# Patient Record
Sex: Female | Born: 1945 | Race: White | Hispanic: No | Smoking: Never smoker
Health system: Southern US, Community
[De-identification: ages and names within clinical notes are randomized; demographics above are authoritative.]

## PROBLEM LIST (undated history)

## (undated) DIAGNOSIS — I739 Peripheral vascular disease, unspecified: Secondary | ICD-10-CM

## (undated) DIAGNOSIS — I10 Essential (primary) hypertension: Secondary | ICD-10-CM

## (undated) DIAGNOSIS — I480 Paroxysmal atrial fibrillation: Secondary | ICD-10-CM

## (undated) DIAGNOSIS — I5032 Chronic diastolic (congestive) heart failure: Secondary | ICD-10-CM

## (undated) DIAGNOSIS — K5792 Diverticulitis of intestine, part unspecified, without perforation or abscess without bleeding: Secondary | ICD-10-CM

## (undated) DIAGNOSIS — C44611 Basal cell carcinoma of skin of unspecified upper limb, including shoulder: Secondary | ICD-10-CM

## (undated) DIAGNOSIS — N951 Menopausal and female climacteric states: Secondary | ICD-10-CM

## (undated) DIAGNOSIS — R931 Abnormal findings on diagnostic imaging of heart and coronary circulation: Secondary | ICD-10-CM

## (undated) DIAGNOSIS — M159 Polyosteoarthritis, unspecified: Secondary | ICD-10-CM

## (undated) DIAGNOSIS — I471 Supraventricular tachycardia, unspecified: Secondary | ICD-10-CM

## (undated) DIAGNOSIS — K219 Gastro-esophageal reflux disease without esophagitis: Secondary | ICD-10-CM

## (undated) DIAGNOSIS — I4891 Unspecified atrial fibrillation: Secondary | ICD-10-CM

## (undated) HISTORY — DX: Chronic diastolic (congestive) heart failure: I50.32

## (undated) HISTORY — DX: Supraventricular tachycardia, unspecified: I47.10

## (undated) HISTORY — DX: Gastro-esophageal reflux disease without esophagitis: K21.9

## (undated) HISTORY — DX: Abnormal findings on diagnostic imaging of heart and coronary circulation: R93.1

## (undated) HISTORY — PX: DILATION AND CURETTAGE OF UTERUS: SHX78

## (undated) HISTORY — DX: Basal cell carcinoma of skin of unspecified upper limb, including shoulder: C44.611

## (undated) HISTORY — DX: Diverticulitis of intestine, part unspecified, without perforation or abscess without bleeding: K57.92

## (undated) HISTORY — PX: KNEE SURGERY: SHX244

## (undated) HISTORY — PX: ABDOMINAL HYSTERECTOMY: SHX81

## (undated) HISTORY — DX: Polyosteoarthritis, unspecified: M15.9

## (undated) HISTORY — DX: Essential (primary) hypertension: I10

## (undated) HISTORY — DX: Paroxysmal atrial fibrillation: I48.0

## (undated) HISTORY — DX: Menopausal and female climacteric states: N95.1

---

## 2001-06-19 HISTORY — PX: BREAST BIOPSY: SHX20

## 2002-11-07 ENCOUNTER — Encounter: Payer: Self-pay | Admitting: Internal Medicine

## 2002-11-07 LAB — HM COLONOSCOPY

## 2005-03-19 ENCOUNTER — Encounter: Payer: Self-pay | Admitting: Internal Medicine

## 2005-03-19 LAB — HM DEXA SCAN

## 2005-04-04 ENCOUNTER — Encounter: Payer: Self-pay | Admitting: Internal Medicine

## 2005-04-07 ENCOUNTER — Ambulatory Visit: Payer: Self-pay | Admitting: Unknown Physician Specialty

## 2005-08-04 ENCOUNTER — Ambulatory Visit: Payer: Self-pay | Admitting: Internal Medicine

## 2006-01-02 ENCOUNTER — Ambulatory Visit: Payer: Self-pay | Admitting: Family Medicine

## 2006-04-11 ENCOUNTER — Ambulatory Visit: Payer: Self-pay | Admitting: Internal Medicine

## 2006-04-17 ENCOUNTER — Ambulatory Visit: Payer: Self-pay | Admitting: Internal Medicine

## 2007-01-14 ENCOUNTER — Telehealth: Payer: Self-pay | Admitting: Internal Medicine

## 2007-01-14 ENCOUNTER — Ambulatory Visit: Payer: Self-pay | Admitting: Internal Medicine

## 2007-01-14 DIAGNOSIS — F438 Other reactions to severe stress: Secondary | ICD-10-CM

## 2007-03-28 ENCOUNTER — Encounter: Payer: Self-pay | Admitting: Internal Medicine

## 2007-03-28 DIAGNOSIS — N959 Unspecified menopausal and perimenopausal disorder: Secondary | ICD-10-CM | POA: Insufficient documentation

## 2007-03-28 DIAGNOSIS — M810 Age-related osteoporosis without current pathological fracture: Secondary | ICD-10-CM | POA: Insufficient documentation

## 2007-03-28 DIAGNOSIS — I10 Essential (primary) hypertension: Secondary | ICD-10-CM

## 2007-03-28 DIAGNOSIS — K5730 Diverticulosis of large intestine without perforation or abscess without bleeding: Secondary | ICD-10-CM | POA: Insufficient documentation

## 2007-03-28 DIAGNOSIS — E785 Hyperlipidemia, unspecified: Secondary | ICD-10-CM | POA: Insufficient documentation

## 2007-04-17 ENCOUNTER — Ambulatory Visit: Payer: Self-pay | Admitting: Internal Medicine

## 2007-04-17 DIAGNOSIS — L570 Actinic keratosis: Secondary | ICD-10-CM

## 2007-04-18 LAB — CONVERTED CEMR LAB
CO2: 31 meq/L (ref 19–32)
Calcium: 9 mg/dL (ref 8.4–10.5)
Chloride: 104 meq/L (ref 96–112)
Direct LDL: 128.4 mg/dL
Eosinophils Relative: 3.8 % (ref 0.0–5.0)
Glucose, Bld: 99 mg/dL (ref 70–99)
HDL: 50.9 mg/dL (ref >39.0)
MCV: 90 fL (ref 78.0–100.0)
Platelets: 323 10*3/uL (ref 150–400)
Potassium: 4.4 meq/L (ref 3.5–5.1)
RBC: 4.39 M/uL (ref 3.87–5.11)
TSH: 2.16 microintl units/mL (ref 0.35–5.50)
Triglycerides: 79 mg/dL (ref 0–149)
WBC: 6.2 10*3/uL (ref 4.5–10.5)

## 2007-05-13 ENCOUNTER — Encounter: Payer: Self-pay | Admitting: Internal Medicine

## 2007-05-13 ENCOUNTER — Ambulatory Visit: Payer: Self-pay | Admitting: Internal Medicine

## 2007-05-20 ENCOUNTER — Encounter (INDEPENDENT_AMBULATORY_CARE_PROVIDER_SITE_OTHER): Payer: Self-pay | Admitting: *Deleted

## 2007-06-07 ENCOUNTER — Ambulatory Visit: Payer: Self-pay | Admitting: Internal Medicine

## 2007-07-11 DIAGNOSIS — R609 Edema, unspecified: Secondary | ICD-10-CM | POA: Insufficient documentation

## 2007-07-16 ENCOUNTER — Telehealth: Payer: Self-pay | Admitting: Family Medicine

## 2007-07-16 ENCOUNTER — Ambulatory Visit: Payer: Self-pay | Admitting: Internal Medicine

## 2007-07-16 ENCOUNTER — Telehealth (INDEPENDENT_AMBULATORY_CARE_PROVIDER_SITE_OTHER): Payer: Self-pay | Admitting: *Deleted

## 2007-07-17 ENCOUNTER — Encounter: Payer: Self-pay | Admitting: Internal Medicine

## 2007-07-18 ENCOUNTER — Telehealth (INDEPENDENT_AMBULATORY_CARE_PROVIDER_SITE_OTHER): Payer: Self-pay | Admitting: *Deleted

## 2007-07-22 ENCOUNTER — Ambulatory Visit: Payer: Self-pay | Admitting: Internal Medicine

## 2007-07-22 LAB — CONVERTED CEMR LAB: Rapid Strep: NEGATIVE

## 2007-10-11 ENCOUNTER — Ambulatory Visit: Payer: Self-pay | Admitting: Internal Medicine

## 2007-10-11 DIAGNOSIS — K219 Gastro-esophageal reflux disease without esophagitis: Secondary | ICD-10-CM | POA: Insufficient documentation

## 2008-04-17 ENCOUNTER — Ambulatory Visit: Payer: Self-pay | Admitting: Internal Medicine

## 2008-04-20 LAB — CONVERTED CEMR LAB
AST: 16 units/L (ref 0–37)
Albumin: 3.7 g/dL (ref 3.5–5.2)
Alkaline Phosphatase: 70 units/L (ref 39–117)
BUN: 18 mg/dL (ref 6–23)
Basophils Relative: 0.5 % (ref 0.0–3.0)
Calcium: 8.8 mg/dL (ref 8.4–10.5)
Creatinine, Ser: 0.8 mg/dL (ref 0.4–1.2)
Eosinophils Relative: 3.6 % (ref 0.0–5.0)
GFR calc Af Amer: 93 mL/min
GFR calc non Af Amer: 77 mL/min
Lymphocytes Relative: 37.1 % (ref 12.0–46.0)
Monocytes Relative: 10.7 % (ref 3.0–12.0)
Neutrophils Relative %: 48.1 % (ref 43.0–77.0)
RBC: 4.24 M/uL (ref 3.87–5.11)
Total Protein: 7.4 g/dL (ref 6.0–8.3)
WBC: 5.4 10*3/uL (ref 4.5–10.5)

## 2008-04-28 ENCOUNTER — Ambulatory Visit: Payer: Self-pay | Admitting: Internal Medicine

## 2008-04-28 ENCOUNTER — Encounter: Payer: Self-pay | Admitting: Internal Medicine

## 2008-05-18 ENCOUNTER — Ambulatory Visit: Payer: Self-pay | Admitting: Internal Medicine

## 2008-05-18 ENCOUNTER — Encounter: Payer: Self-pay | Admitting: Internal Medicine

## 2008-05-22 ENCOUNTER — Encounter (INDEPENDENT_AMBULATORY_CARE_PROVIDER_SITE_OTHER): Payer: Self-pay | Admitting: *Deleted

## 2008-07-09 ENCOUNTER — Ambulatory Visit: Payer: Self-pay | Admitting: Family Medicine

## 2008-11-25 ENCOUNTER — Ambulatory Visit: Payer: Self-pay | Admitting: Family Medicine

## 2008-11-25 DIAGNOSIS — IMO0002 Reserved for concepts with insufficient information to code with codable children: Secondary | ICD-10-CM

## 2008-12-04 ENCOUNTER — Encounter: Payer: Self-pay | Admitting: Family Medicine

## 2008-12-10 ENCOUNTER — Encounter: Payer: Self-pay | Admitting: Family Medicine

## 2008-12-17 ENCOUNTER — Encounter: Payer: Self-pay | Admitting: Family Medicine

## 2009-01-11 ENCOUNTER — Encounter: Payer: Self-pay | Admitting: Internal Medicine

## 2009-04-22 ENCOUNTER — Encounter: Payer: Self-pay | Admitting: Internal Medicine

## 2009-04-23 ENCOUNTER — Ambulatory Visit: Payer: Self-pay | Admitting: Internal Medicine

## 2009-04-23 DIAGNOSIS — R002 Palpitations: Secondary | ICD-10-CM

## 2009-04-23 DIAGNOSIS — F341 Dysthymic disorder: Secondary | ICD-10-CM | POA: Insufficient documentation

## 2009-04-26 LAB — CONVERTED CEMR LAB
ALT: 12 units/L (ref 0–35)
AST: 16 units/L (ref 0–37)
Albumin: 3.8 g/dL (ref 3.5–5.2)
Alkaline Phosphatase: 74 units/L (ref 39–117)
BUN: 11 mg/dL (ref 6–23)
Basophils Absolute: 0.1 10*3/uL (ref 0.0–0.1)
Basophils Relative: 1 % (ref 0.0–3.0)
Bilirubin, Direct: 0 mg/dL (ref 0.0–0.3)
CO2: 30 meq/L (ref 19–32)
Calcium: 8.9 mg/dL (ref 8.4–10.5)
Chloride: 106 meq/L (ref 96–112)
Cholesterol: 194 mg/dL (ref 0–200)
Creatinine, Ser: 0.8 mg/dL (ref 0.4–1.2)
Creatinine,U: 202.8 mg/dL
Eosinophils Absolute: 0.1 10*3/uL (ref 0.0–0.7)
Eosinophils Relative: 2.4 % (ref 0.0–5.0)
GFR calc non Af Amer: 76.43 mL/min (ref >60)
Glucose, Bld: 93 mg/dL (ref 70–99)
HCT: 38.9 % (ref 36.0–46.0)
HDL: 43.5 mg/dL (ref >39.00)
Hemoglobin: 13.3 g/dL (ref 12.0–15.0)
LDL Cholesterol: 120 mg/dL — ABNORMAL HIGH (ref 0–99)
Lymphocytes Relative: 34.8 % (ref 12.0–46.0)
Lymphs Abs: 1.9 10*3/uL (ref 0.7–4.0)
MCHC: 34.2 g/dL (ref 30.0–36.0)
MCV: 90.7 fL (ref 78.0–100.0)
Microalb Creat Ratio: 10.8 mg/g (ref 0.0–30.0)
Microalb, Ur: 2.2 mg/dL — ABNORMAL HIGH (ref 0.0–1.9)
Monocytes Absolute: 0.5 10*3/uL (ref 0.1–1.0)
Monocytes Relative: 9.2 % (ref 3.0–12.0)
Neutro Abs: 3 10*3/uL (ref 1.4–7.7)
Neutrophils Relative %: 52.6 % (ref 43.0–77.0)
Phosphorus: 3.3 mg/dL (ref 2.3–4.6)
Platelets: 240 10*3/uL (ref 150.0–400.0)
Potassium: 4.1 meq/L (ref 3.5–5.1)
RBC: 4.29 M/uL (ref 3.87–5.11)
RDW: 12.7 % (ref 11.5–14.6)
Sodium: 143 meq/L (ref 135–145)
TSH: 2.66 microintl units/mL (ref 0.35–5.50)
Total Bilirubin: 0.7 mg/dL (ref 0.3–1.2)
Total CHOL/HDL Ratio: 4
Total Protein: 7.8 g/dL (ref 6.0–8.3)
Triglycerides: 151 mg/dL — ABNORMAL HIGH (ref 0.0–149.0)
VLDL: 30.2 mg/dL (ref 0.0–40.0)
WBC: 5.6 10*3/uL (ref 4.5–10.5)

## 2009-05-07 ENCOUNTER — Ambulatory Visit: Payer: Self-pay | Admitting: Licensed Clinical Social Worker

## 2009-05-20 ENCOUNTER — Encounter: Payer: Self-pay | Admitting: Internal Medicine

## 2009-05-20 ENCOUNTER — Ambulatory Visit: Payer: Self-pay | Admitting: Internal Medicine

## 2009-05-21 ENCOUNTER — Ambulatory Visit: Payer: Self-pay | Admitting: Licensed Clinical Social Worker

## 2009-05-27 ENCOUNTER — Encounter: Payer: Self-pay | Admitting: Internal Medicine

## 2009-05-28 ENCOUNTER — Ambulatory Visit: Payer: Self-pay | Admitting: Licensed Clinical Social Worker

## 2009-06-25 ENCOUNTER — Ambulatory Visit: Payer: Self-pay | Admitting: Licensed Clinical Social Worker

## 2009-07-16 ENCOUNTER — Ambulatory Visit: Payer: Self-pay | Admitting: Licensed Clinical Social Worker

## 2009-07-19 ENCOUNTER — Telehealth: Payer: Self-pay | Admitting: Internal Medicine

## 2009-08-27 ENCOUNTER — Ambulatory Visit: Payer: Self-pay | Admitting: Licensed Clinical Social Worker

## 2009-09-17 ENCOUNTER — Ambulatory Visit: Payer: Self-pay | Admitting: Licensed Clinical Social Worker

## 2010-02-25 ENCOUNTER — Ambulatory Visit: Payer: Self-pay | Admitting: Internal Medicine

## 2010-02-28 LAB — CONVERTED CEMR LAB
ALT: 12 units/L (ref 0–35)
Albumin: 4 g/dL (ref 3.5–5.2)
Alkaline Phosphatase: 73 units/L (ref 39–117)
Basophils Relative: 0.7 % (ref 0.0–3.0)
Bilirubin, Direct: 0.1 mg/dL (ref 0.0–0.3)
CO2: 31 meq/L (ref 19–32)
Calcium: 9.4 mg/dL (ref 8.4–10.5)
Chloride: 104 meq/L (ref 96–112)
Creatinine,U: 144.3 mg/dL
Eosinophils Absolute: 0.1 10*3/uL (ref 0.0–0.7)
Eosinophils Relative: 1.9 % (ref 0.0–5.0)
Hemoglobin: 13 g/dL (ref 12.0–15.0)
MCHC: 34 g/dL (ref 30.0–36.0)
MCV: 89.9 fL (ref 78.0–100.0)
Microalb Creat Ratio: 1.7 mg/g (ref 0.0–30.0)
Monocytes Absolute: 0.6 10*3/uL (ref 0.1–1.0)
Neutro Abs: 3.2 10*3/uL (ref 1.4–7.7)
Neutrophils Relative %: 48.3 % (ref 43.0–77.0)
Potassium: 4.4 meq/L (ref 3.5–5.1)
RBC: 4.24 M/uL (ref 3.87–5.11)
Sodium: 142 meq/L (ref 135–145)
TSH: 2.42 microintl units/mL (ref 0.35–5.50)
Total Protein: 7.2 g/dL (ref 6.0–8.3)
WBC: 6.5 10*3/uL (ref 4.5–10.5)

## 2010-04-28 ENCOUNTER — Telehealth: Payer: Self-pay | Admitting: Internal Medicine

## 2010-05-27 ENCOUNTER — Ambulatory Visit: Payer: Self-pay | Admitting: Internal Medicine

## 2010-05-27 ENCOUNTER — Encounter: Payer: Self-pay | Admitting: Internal Medicine

## 2010-06-03 LAB — HM MAMMOGRAPHY: HM Mammogram: NORMAL

## 2010-06-06 ENCOUNTER — Encounter: Payer: Self-pay | Admitting: Internal Medicine

## 2010-07-19 NOTE — Progress Notes (Signed)
Summary: order for mammogram   Phone Note Call from Patient Call back at Home Phone [phone]   Caller: Patient Call For: Cindee Salt MD Summary of Call: Patient is asking if she could get an order for her yearly mammogram [facility]. She will make her own appt.  Initial call taken by: Melody Comas,  April 28, 2010 10:04 AM  Follow-up for Phone Call        Please fax over order Follow-up by: Cindee Salt MD,  April 28, 2010 2:59 PM

## 2010-07-19 NOTE — Assessment & Plan Note (Signed)
Summary: F/U/CLE   Vital Signs:  Patient profile:   65 year old female Weight:      220 pounds BMI:     33.57 Temp:     98.1 degrees F oral Pulse rate:   68 / minute Pulse rhythm:   regular BP sitting:   160 / 90  (left arm) Cuff size:   regular  Vitals Entered By: Mervin Hack CMA Duncan Dull) (February 25, 2010 10:26 AM) CC: follow-up visit   History of Present Illness: Doing fairly well Does note some pain in feet and legs when she walks Did see Dr Orland Jarred for injections but has persistent pain Has thigh and calf pain when she first gets up and it eases up after 15 minutes or so Does wear orthotics No regular exercise  Did go to [employer] in the spring Doing much better  Doesn't check blood pressure No headaches No chest pain No SOB No edema  Did see Van Clines about her right knee does have some arthritis there  stomach fine on the meds---only uses it as needed     Allergies: 1)  ! * Pencillin 2)  Boniva (Ibandronate Sodium)  Past History:  Past medical, surgical, family and social histories (including risk factors) reviewed for relevance to current acute and chronic problems.  Past Medical History: Reviewed history from 10/11/2007 and no changes required. Diverticulosis, colon Hyperlipidemia Hypertension Osteoporosis Menopausal symptoms GERD  Past Surgical History: Reviewed history from 04/17/2008 and no changes required. D & C- rupture- peritonitis & hysterectomy 12/79 Vaginal deliveries x 2 Left breast calcifications removed DEXA- osteoporosis - spine 10/06  Family History: Reviewed history from 03/28/2007 and no changes required. Dad had tinnitus but didn't lose his hearing Father: Died at age 56- CAD Mother: Died at age 51- Died after hip fracture- osteoporosis Siblings: One brother, one sister alive with breast cancer CAD in  Dad & Dad's side No HTN or DM Breast cancer in  sister Mat GF died of colon cancer;  Dad  and Pat GM had polyps  Social History: Reviewed history from 04/17/2007 and no changes required. 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  Stress with daughter still going through divorce  Physical Exam  General:  alert and normal appearance.   Neck:  supple, no masses, no thyromegaly, no carotid bruits, and no cervical lymphadenopathy.   Lungs:  normal respiratory effort, no intercostal retractions, no accessory muscle use, and normal breath sounds.   Heart:  normal rate, regular rhythm, no murmur, and no gallop.   Abdomen:  soft and non-tender.   Msk:  no joint tenderness and no joint swelling.   Pulses:  1+ Neurologic:  antalgic gait when just getting up No weakness Psych:  normally interactive, good eye contact, not anxious appearing, and not depressed appearing.     Impression & Recommendations:  Problem # 1:  HYPERTENSION (ICD-401.9) Assessment Unchanged  borderline will hold off on treatment but discussed fitness efforts will check urine microal and treat if positive  BP today: 160/90 Prior BP: 160/88 (04/23/2009)  Labs Reviewed: K+: 4.1 (04/23/2009) Creat: : 0.8 (04/23/2009)   Chol: 194 (04/23/2009)   HDL: 43.50 (04/23/2009)   LDL: 120 (04/23/2009)  TG: 151.0 (04/23/2009)  Orders: TLB-Renal Function Panel (80069-RENAL) TLB-CBC Platelet - w/Differential (85025-CBCD) TLB-Hepatic/Liver Function Pnl (80076-HEPATIC) TLB-TSH (Thyroid Stimulating Hormone) (84443-TSH) Venipuncture (33295)  Problem # 2:  DYSTHYMIA (ICD-300.4) Assessment: Improved doing better after counselling still some stress and mild sleep problems she will go back for more counselling if worsens  Problem # 3:  HYPERLIPIDEMIA (ICD-272.4) Assessment:  Unchanged will just continue the fish oil  Labs Reviewed: SGOT: 16 (04/23/2009)   SGPT: 12 (04/23/2009)   HDL:43.50 (04/23/2009), 50.9 (04/17/2007)  LDL:120 (04/23/2009), DEL (04/17/2007)  Chol:194 (04/23/2009), 205 (04/17/2007)  Trig:151.0 (04/23/2009), 79 (04/17/2007)  Problem # 4:  GERD (ICD-530.81) Assessment: Comment Only fine with just occ meds  Her updated medication list for this problem includes:    Omeprazole 20 Mg Cpdr (Omeprazole) .Marland Kitchen... Take 1 tablet by mouth once a day as needed  Complete Medication List: 1)  Fish Oil Oil (Fish oil) .... Daily 2)  Omeprazole 20 Mg Cpdr (Omeprazole) .... Take 1 tablet by mouth once a day as needed 3)  Vitamin D 1000 Unit Tabs (Cholecalciferol) .Marland Kitchen.. 1 tab daily  Patient Instructions: 1)  Please schedule a follow-up appointment in 1 year.   Current Allergies (reviewed today): ! * PENCILLIN BONIVA (IBANDRONATE SODIUM)  Appended Document: Orders Update    Clinical Lists Changes  Orders: Added new Test order of TLB-Microalbumin/Creat Ratio, Urine (82043-MALB) - Signed      Appended Document: F/U/CLE    Clinical Lists Changes  Orders: Added new Service order of Flu Vaccine 45yrs + [phone]) - Signed Added new Service order of Admin 1st Vaccine (66063) - Signed Added new Service order of Admin 1st Vaccine [facility]) [phone]) - Signed Observations: Added new observation of FLU VAX#1VIS: 01/11/10 version given February 25, 2010. (02/25/2010 11:33) Added new observation of FLU VAXLOT: [id number] (02/25/2010 11:33) Added new observation of FLU VAX EXP: 12/17/2010 (02/25/2010 11:33) Added new observation of FLU VAXBY: DeShannon Smith CMA (AAMA) (02/25/2010 11:33) Added new observation of FLU VAXRTE: IM (02/25/2010 11:33) Added new observation of FLU VAX DSE: 0.5 ml (02/25/2010 11:33) Added new observation of FLU VAXMFR: GlaxoSmithKline (02/25/2010 11:33) Added new observation of FLU VAX SITE: right deltoid (02/25/2010 11:33) Added  new observation of FLU VAX: Fluvax 3+ (02/25/2010 11:33)       Influenza Vaccine    Vaccine Type: Fluvax 3+    Site: right deltoid    Mfr: GlaxoSmithKline    Dose: 0.5 ml    Route: IM    Given by: Mervin Hack CMA (AAMA)    Exp. Date: 12/17/2010    Lot #: [id number]    VIS given: 01/11/10 version given February 25, 2010.  Flu Vaccine Consent Questions    Do you have a history of severe allergic reactions to this vaccine? no    Any prior history of allergic reactions to egg and/or gelatin? no    Do you have a sensitivity to the preservative Thimersol? no    Do you have a past history of Guillan-Barre Syndrome? no    Do you currently have an acute febrile illness? no    Have you ever had a severe reaction to latex? no    Vaccine information given and explained to patient? yes    Are you currently pregnant? no

## 2010-07-19 NOTE — Progress Notes (Signed)
Summary: ? Flu, virus  Phone Note Call from Patient Call back at Home Phone [phone]   Caller: Patient Call For: Cindee Salt MD Summary of Call: Patient called and stated that she has been sick since Saturday.  Headache, body aches, chills, congestion, no sore throat, and coughing.  She kept her grandson this weekend and he had been to the doctor and was treated for the flu.  She has been taking Theraflu, Tylenol, cough drops, and sinus pills.  Advised that this sounds like it could be the flu or viral and if so there is not much that can be done.  Get plenty of rest, clear liquids, Braty diet.  If symptoms worsen call us back.  Will sent note to the doctor.   Initial call taken by: Linde Gillis CMA Duncan Dull),  July 19, 2009 12:37 PM  Follow-up for Phone Call        agreed --too late for antivirals even if it is the flu See if sig fever or SOB Follow-up by: Cindee Salt MD,  July 19, 2009 12:57 PM  Additional Follow-up for Phone Call Additional follow up Details #1::        Spoke with patient and advised results.  Additional Follow-up by: Mervin Hack CMA Duncan Dull),  July 19, 2009 3:52 PM

## 2010-07-21 NOTE — Letter (Signed)
Summary: Results Follow up Letter  [place] [facility]  [address] [place], [place] [address]   Phone: [phone]  Fax: [phone]    06/06/2010 MRN: [id number]  Veronica Stanley [address] [place], [place]  [address]  Dear [name],  The following are the results of your recent test(s):  Test         Result    Pap Smear:        Normal _____  Not Normal _____ Comments: ______________________________________________________ Cholesterol: LDL(Bad cholesterol):         Your goal is less than:         HDL (Good cholesterol):       Your goal is more than: Comments:  ______________________________________________________ Mammogram:        Normal __X___  Not Normal _____ Comments:mammo looks fine, repeat recommended in 1-2 years  ___________________________________________________________________ Hemoccult:        Normal _____  Not normal _______ Comments:    _____________________________________________________________________ Other Tests:    We routinely do not discuss normal results over the telephone.  If you desire a copy of the results, or you have any questions about this information we can discuss them at your next office visit.   Sincerely,      Tillman Abide, MD

## 2010-10-05 ENCOUNTER — Encounter: Payer: Self-pay | Admitting: Internal Medicine

## 2010-10-10 ENCOUNTER — Ambulatory Visit (INDEPENDENT_AMBULATORY_CARE_PROVIDER_SITE_OTHER): Payer: BC Managed Care – PPO | Admitting: Internal Medicine

## 2010-10-10 ENCOUNTER — Encounter: Payer: Self-pay | Admitting: Internal Medicine

## 2010-10-10 VITALS — BP 148/90 | HR 68 | Temp 98.0°F | Ht 68.0 in | Wt 226.0 lb

## 2010-10-10 DIAGNOSIS — S76119A Strain of unspecified quadriceps muscle, fascia and tendon, initial encounter: Secondary | ICD-10-CM

## 2010-10-10 DIAGNOSIS — S838X9A Sprain of other specified parts of unspecified knee, initial encounter: Secondary | ICD-10-CM

## 2010-10-10 DIAGNOSIS — F430 Acute stress reaction: Secondary | ICD-10-CM

## 2010-10-10 MED ORDER — LORAZEPAM 0.5 MG PO TABS: 0.2500 mg | ORAL_TABLET | Freq: Two times a day (BID) | ORAL | Status: DC | PRN

## 2010-10-10 NOTE — Patient Instructions (Signed)
Please wear compression shorts or use a compression sleeve on left upper leg. Use ice after exerting yourself if your leg hurts

## 2010-10-10 NOTE — Progress Notes (Signed)
  Subjective:    Patient ID: Veronica Stanley, female    DOB: 22-Jun-1945, 65 y.o.   MRN: [id number]  Leg Pain    Had pain on left groin that has persisted--goes back almost 2 years (from after strain after lifting stones) Does better with special shoes but can't wear them to work Got PT 2 years ago Got new mattress to see if that would help (last year) Still has sleep problems---so she changes to harder mattress elsewhere in house  Variable Better today but worse last week Worried-- "would if I have a blockage"  No hernia or bulging  Pain if she overdoes it--like lots of lawn work It is tender at times No radiation down leg or into back No discrete weakness but may "give way" at times Leg seems to internally rotate at times  Lots of stress Health issues with grandson and daughter going through divorce Has tried melatonin and it helps sleep Wonders about something for nerves Plans to retire for good soon  Current outpatient prescriptions:Cholecalciferol (VITAMIN D) 1000 UNITS capsule, Take 1,000 Units by mouth daily.  , Disp: , Rfl: ;  fish oil-omega-3 fatty acids 1000 MG capsule, Take 2 g by mouth daily.  , Disp: , Rfl: ;  omeprazole (PRILOSEC) 20 MG capsule, Take 20 mg by mouth daily as needed.  , Disp: , Rfl:   Past Medical History  Diagnosis Date  . Diverticulitis     colon  . Hyperlipidemia   . Hypertension   . Osteoporosis   . GERD (gastroesophageal reflux disease)   . Menopausal symptoms     Past Surgical History  Procedure Date  . Dilation and curettage of uterus     rupture- peritonitis & hysterectomy 12/79  . Vaginal delivery     x2  . Breast biopsy     Left breast calcifications removed    Family History  Problem Relation Age of Onset  . Osteoporosis Mother   . Coronary artery disease Father   . Breast cancer Sister   . Cancer Maternal Grandfather     colon cancer  . Diabetes Neg Hx   . Hypertension Neg Hx     History   Social History  . Marital  Status: Divorced    Spouse Name: N/A    Number of Children: 2  . Years of Education: N/A   Occupational History  . 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 part-time Armed forces operational officer court system    Social History Main Topics  . Smoking status: Never Smoker   . Smokeless tobacco: Never Used  . Alcohol Use: Yes     occasional  . Drug Use: Not on file  . Sexually Active: Not on file   Other Topics Concern  . Not on file   Social History Narrative  . No narrative on file   Review of Systems No change in bowels  Voids without probelms   Objective:   Physical Exam  Cardiovascular: Intact distal pulses.   Abdominal:       No femoral or inguinal hernias  Musculoskeletal:       Tenderness along left upper quad muscle  Psychiatric: She has a normal mood and affect. Her behavior is normal. Judgment and thought content normal.          Assessment & Plan:

## 2011-03-03 ENCOUNTER — Encounter: Payer: Self-pay | Admitting: Internal Medicine

## 2011-03-03 ENCOUNTER — Ambulatory Visit (INDEPENDENT_AMBULATORY_CARE_PROVIDER_SITE_OTHER): Payer: Medicare Other | Admitting: Internal Medicine

## 2011-03-03 VITALS — BP 140/98 | HR 66 | Temp 98.9°F | Ht 68.0 in | Wt 226.0 lb

## 2011-03-03 DIAGNOSIS — E785 Hyperlipidemia, unspecified: Secondary | ICD-10-CM

## 2011-03-03 DIAGNOSIS — Z0000 Encounter for general adult medical examination without abnormal findings: Secondary | ICD-10-CM | POA: Insufficient documentation

## 2011-03-03 DIAGNOSIS — F438 Other reactions to severe stress: Secondary | ICD-10-CM

## 2011-03-03 DIAGNOSIS — M159 Polyosteoarthritis, unspecified: Secondary | ICD-10-CM

## 2011-03-03 DIAGNOSIS — I10 Essential (primary) hypertension: Secondary | ICD-10-CM

## 2011-03-03 DIAGNOSIS — K219 Gastro-esophageal reflux disease without esophagitis: Secondary | ICD-10-CM

## 2011-03-03 DIAGNOSIS — M810 Age-related osteoporosis without current pathological fracture: Secondary | ICD-10-CM

## 2011-03-03 LAB — LIPID PANEL
Total CHOL/HDL Ratio: 4
VLDL: 17.2 mg/dL (ref 0.0–40.0)

## 2011-03-03 LAB — LDL CHOLESTEROL, DIRECT: Direct LDL: 134.7 mg/dL

## 2011-03-03 LAB — CBC WITH DIFFERENTIAL/PLATELET
Basophils Relative: 0.6 % (ref 0.0–3.0)
Eosinophils Relative: 2.4 % (ref 0.0–5.0)
HCT: 40.7 % (ref 36.0–46.0)
Lymphs Abs: 2.2 10*3/uL (ref 0.7–4.0)
MCHC: 33 g/dL (ref 30.0–36.0)
MCV: 90.9 fl (ref 78.0–100.0)
Monocytes Absolute: 0.5 10*3/uL (ref 0.1–1.0)
Neutro Abs: 3.5 10*3/uL (ref 1.4–7.7)
RBC: 4.48 Mil/uL (ref 3.87–5.11)
WBC: 6.5 10*3/uL (ref 4.5–10.5)

## 2011-03-03 LAB — HEPATIC FUNCTION PANEL
ALT: 10 U/L (ref 0–35)
AST: 17 U/L (ref 0–37)
Albumin: 4.1 g/dL (ref 3.5–5.2)
Total Bilirubin: 0.5 mg/dL (ref 0.3–1.2)
Total Protein: 7.8 g/dL (ref 6.0–8.3)

## 2011-03-03 LAB — BASIC METABOLIC PANEL
CO2: 28 mEq/L (ref 19–32)
Chloride: 105 mEq/L (ref 96–112)
Potassium: 4.8 mEq/L (ref 3.5–5.1)

## 2011-03-03 NOTE — Assessment & Plan Note (Signed)
Improved lately No Rx indicated Has lorazepam for prn use

## 2011-03-03 NOTE — Assessment & Plan Note (Signed)
I have personally reviewed the Medicare Annual Wellness questionnaire and have noted 1. The patient's medical and social history 2. Their use of alcohol, tobacco or illicit drugs 3. Their current medications and supplements 4. The patient's functional ability including ADL's, fall risks, home safety risks and hearing or visual             impairment. 5. Diet and physical activities 6. Evidence for depression or mood disorders  The patients weight, height, BMI and visual acuity have been recorded in the chart I have made referrals, counseling and provided education to the patient based review of the above and I have provided the pt with a written personalized care plan for preventive services.  I have provided you with a copy of your personalized plan for preventive services. Please take the time to review along with your updated medication list.  Due for zostavax--Rx given counselling done

## 2011-03-03 NOTE — Patient Instructions (Signed)
Please get the shingles vaccine Set up your bone density test Please start walking daily Start Weight Watchers again to control your eating

## 2011-03-03 NOTE — Progress Notes (Signed)
Subjective:    Patient ID: Veronica Stanley, female    DOB: [birth year], 65 y.o.   MRN: [id number]  HPI Here for wellness exam UTD on immunizations---[place]  UTD on cancer screening She may want to stick with yearly mammograms due to sister's history of breast cancer  Hasn't fallen. Does fear falls Know osteoporosis in spine Will recheck now  Feels lonesome but not really depressed Got overly sedated even with 1/4 of the lorazepam Sleeps okay with melatonin prn Still with family stress but not quite as severe Active in church and has group of lady friends she is active with  Still working part time at courthouse at [employer] Is retired from [employer]  Has acid reflux at times Uses the nighttime famotidine more than omeprazole No swallowing problems  Doesn't check BP No headaches No chest pain Stable DOE with stairs---conditioning is poor Doesn't really exercise No dietary regimen  Still on fish oil for cholesterol  Ongoing leg and knee pain Gets stiff after sitting  Current Outpatient Prescriptions on File Prior to Visit  Medication Sig Dispense Refill  . Cholecalciferol (VITAMIN D) 1000 UNITS capsule Take 1,000 Units by mouth daily.        . fish oil-omega-3 fatty acids 1000 MG capsule Take 2 g by mouth daily.        Marland Kitchen LORazepam (ATIVAN) 0.5 MG tablet Take 0.5-1 tablets (0.25-0.5 mg total) by mouth 2 (two) times daily as needed for anxiety.  30 tablet  0  . omeprazole (PRILOSEC) 20 MG capsule Take 20 mg by mouth daily as needed.          Allergies  Allergen Reactions  . Ibandronate Sodium     REACTION: aches  . Penicillins     Past Medical History  Diagnosis Date  . Diverticulitis     colon  . Hyperlipidemia   . Hypertension   . Osteoporosis   . GERD (gastroesophageal reflux disease)   . Menopausal symptoms   . Osteoarthrosis involving, or with mention of more than one site, but not specified as generalized, multiple sites      Past Surgical History  Procedure Date  . Dilation and curettage of uterus     rupture- peritonitis & hysterectomy 12/79  . Vaginal delivery     x2  . Breast biopsy     Left breast calcifications removed    Family History  Problem Relation Age of Onset  . Osteoporosis Mother   . Coronary artery disease Father   . Breast cancer Sister   . Cancer Maternal Grandfather     colon cancer  . Diabetes Neg Hx   . Hypertension Neg Hx     History   Social History  . Marital Status: Divorced    Spouse Name: N/A    Number of Children: 2  . Years of Education: N/A   Occupational History  . 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 part-time Armed forces operational officer court system    Social History Main Topics  . Smoking status: Never Smoker   . Smokeless tobacco: Never Used  . Alcohol Use: Yes     occasional  . Drug Use: Not on file  . Sexually Active: Not on file   Other Topics Concern  . Not on file   Social History Narrative   No living willWould want daughter, [name], to make health care decisions for herWould accept resuscitation attemptsNot sure about tube feedings   Review  of Systems Sleeps okay but occ uses the melatonin Aleve or tylenol do help the leg aching Appetite is fine---no real dietary regimen    Objective:   Physical Exam  Constitutional: She is oriented to person, place, and time. She appears well-developed and well-nourished. No distress.  Neck: Normal range of motion. Neck supple. No thyromegaly present.  Cardiovascular: Normal rate, regular rhythm, normal heart sounds and intact distal pulses.  Exam reveals no gallop.   No murmur heard. Pulmonary/Chest: Effort normal and breath sounds normal. No respiratory distress. She has no wheezes. She has no rales.  Abdominal: Soft. There is no tenderness.  Musculoskeletal: She exhibits no edema.       Mild swelling of right knee  Lymphadenopathy:    She has no cervical adenopathy.  Neurological: She is  alert and oriented to person, place, and time.       Phillips Grout" [phone] D-[id number] Recall 2/3  Psychiatric: She has a normal mood and affect. Her behavior is normal. Judgment and thought content normal.          Assessment & Plan:

## 2011-03-03 NOTE — Assessment & Plan Note (Signed)
BP Readings from Last 3 Encounters:  03/03/11 140/98  10/10/10 148/90  02/25/10 160/90   Discussed that she is still elevated Wants to avoid meds Discussed lifestyle issues Check labs

## 2011-03-03 NOTE — Assessment & Plan Note (Signed)
On vitamin D Due for repeat DEXA

## 2011-03-03 NOTE — Assessment & Plan Note (Signed)
Does okay with the prn meds

## 2011-03-23 ENCOUNTER — Encounter: Payer: Self-pay | Admitting: Internal Medicine

## 2011-03-23 ENCOUNTER — Ambulatory Visit: Payer: Self-pay | Admitting: Internal Medicine

## 2011-03-29 ENCOUNTER — Telehealth: Payer: Self-pay | Admitting: *Deleted

## 2011-03-29 NOTE — Telephone Encounter (Signed)
.  left message at home to have patient return my call, work and cell numbers are wrong.

## 2011-03-29 NOTE — Telephone Encounter (Signed)
Message copied by Sueanne Margarita on Wed Mar 29, 2011  3:29 PM ------      Message from: Tillman Abide I      Created: Sat Mar 25, 2011  9:04 AM       Please call      The bone density in the spine has actually improved and is now only in the osteopenic range      Very good news      No additional  treatment is indicated other than regular weight bearing exercise, vitamin D and calcium

## 2011-04-03 NOTE — Telephone Encounter (Signed)
Spoke with patient and advised results   

## 2011-05-26 ENCOUNTER — Telehealth: Payer: Self-pay | Admitting: Internal Medicine

## 2011-05-26 DIAGNOSIS — Z1231 Encounter for screening mammogram for malignant neoplasm of breast: Secondary | ICD-10-CM

## 2011-05-26 NOTE — Telephone Encounter (Signed)
Patient needs order for annual Mammogram. Call back number [phone].

## 2011-05-26 NOTE — Telephone Encounter (Signed)
Addended by: Tillman Abide I on: 05/26/2011 01:02 PM   Modules accepted: Orders

## 2011-07-20 ENCOUNTER — Ambulatory Visit: Payer: Self-pay | Admitting: Internal Medicine

## 2011-07-25 ENCOUNTER — Encounter: Payer: Self-pay | Admitting: *Deleted

## 2011-09-07 ENCOUNTER — Ambulatory Visit: Payer: Medicare Other | Admitting: Internal Medicine

## 2011-10-01 ENCOUNTER — Emergency Department: Payer: Self-pay | Admitting: Emergency Medicine

## 2011-10-02 ENCOUNTER — Ambulatory Visit: Payer: Medicare Other | Admitting: Internal Medicine

## 2011-10-02 ENCOUNTER — Telehealth: Payer: Self-pay | Admitting: Internal Medicine

## 2011-10-02 NOTE — Telephone Encounter (Signed)
Patient didn't get a call about the appt, rescheduled the appt for wednesday

## 2011-10-02 NOTE — Telephone Encounter (Signed)
Please add her on at 4:30PM so I can assess the wound

## 2011-10-02 NOTE — Telephone Encounter (Signed)
Caller: Marka/Self; PCP: Tillman Abide I.; CB#: [phone]; ; ; Call regarding F/U Call From Stitches Yesterday;  Pt had a wooden bed fall on her leg yesterday . She was taken to [facility] yesterday and had 19 stitches applied on her left calf - ankle area. She is scheduled to have a f/u on Wed with Dr. Alphonsus Sias. Pt is calling to let the MD know that her wound is leaking fluid in several places - it is running down her leg -pt described is as a sero-sanguinous fluid - no signs of infection noted. She was not presbribed any abx and is concerned about that. She is afeb.. There are no appts left at the office today/RN called the back line per the info from the office - and was advised to sent the note to Dr. Alphonsus Sias who will call the pt back.Marland Kitchen

## 2011-10-02 NOTE — Telephone Encounter (Signed)
Triage Record Num: [id number] Operator: Lesli Albee Patient Name: Veronica Stanley Call Date & Time: 10/02/2011 1:07:30PM Patient Phone: [phone] PCP: Patient Gender: Female PCP Fax : Patient DOB: [birth year] Practice Name: [facility] Reason for Call: Caller: Veronica Stanley/Self; PCP: Tillman Abide I.; CB#: [phone]; ; ; Call regarding F/U Call From Stitches Yesterday; Pt had a wooden bed fall on her leg yesterday . She was taken to [facility] yesterday and had 19 stitches applied on her left calf - ankle area. She is scheduled to have a f/u on Wed with Dr. Alphonsus Sias. Pt is calling to let the MD know that her wound is leaking fluid in several places - it is running down her leg -pt described is as a sero-sanguinous fluid - no signs of infection noted. She was not presbribed any abx and is concerned about that. She is afeb.. There are no appts left at the office today/RN called the back line per the info from the office - and was advised to sent the note to Dr. Alphonsus Sias who will call the pt back.. Protocol(s) Used: Abrasions, Lacerations, Puncture Wounds Recommended Outcome per Protocol: See ED Immediately Reason for Outcome: Penetrating full thickness injury (an injury that passes through the skin layers of epidermis and dermis and penetrates into the underlying subcutaneous tissue) Care Advice: ~ IMMEDIATE ACTION DO NOT remove impaling objects until injury is medically evaluated. - If there is an object in a bleeding wound, apply pressure around the area not directly over it. ~ Control Bleeding: - Cover wound with a clean cloth and apply firm pressure to control bleeding. - If bleeding continues through dressing, add more material. - DO NOT remove old dressing. - Continue to apply direct pressure. ~ 10/02/2011 2:03:43PM Page 1 of 1 [email or web address]

## 2011-10-02 NOTE — Telephone Encounter (Signed)
Patient added on at 4:30 today.

## 2011-10-02 NOTE — Telephone Encounter (Signed)
appt given for 4:30PM --she may not have been told about being added on

## 2011-10-02 NOTE — Telephone Encounter (Signed)
French Ana with CAN said pt received 19 stitches at [facility] on 10/01/11 and today incision is draining a clear and bloody fluid described as running down her leg. There is no redness or warmth at incision. French Ana said pt did not want to go back to [facility]. Dee advised to keep dressing on and CAN to send note to Dr Alphonsus Sias.

## 2011-10-04 ENCOUNTER — Ambulatory Visit: Payer: Medicare Other | Admitting: Internal Medicine

## 2011-10-04 ENCOUNTER — Ambulatory Visit (INDEPENDENT_AMBULATORY_CARE_PROVIDER_SITE_OTHER): Payer: Medicare Other | Admitting: Internal Medicine

## 2011-10-04 ENCOUNTER — Encounter: Payer: Self-pay | Admitting: Internal Medicine

## 2011-10-04 VITALS — BP 160/80 | HR 75 | Temp 98.0°F | Wt 225.0 lb

## 2011-10-04 DIAGNOSIS — S81812A Laceration without foreign body, left lower leg, initial encounter: Secondary | ICD-10-CM | POA: Insufficient documentation

## 2011-10-04 DIAGNOSIS — S91009A Unspecified open wound, unspecified ankle, initial encounter: Secondary | ICD-10-CM

## 2011-10-04 DIAGNOSIS — Z23 Encounter for immunization: Secondary | ICD-10-CM

## 2011-10-04 NOTE — Progress Notes (Signed)
  Subjective:    Patient ID: Veronica Stanley, female    DOB: 1945-12-06, 66 y.o.   MRN: [id number]  HPI Here with daughter Veronica Stanley bedpost fell on her lower left leg 3 days ago Severe laceration, "to the bone" Repaired at [facility] Only surface sutures  No pain except with the repair  No sig tenderness No fever Hasn't been in shower since then at doctor's direction  Current Outpatient Prescriptions on File Prior to Visit  Medication Sig Dispense Refill  . Cholecalciferol (VITAMIN D) 1000 UNITS capsule Take 1,000 Units by mouth daily.        . famotidine (PEPCID) 20 MG tablet Take 20 mg by mouth at bedtime as needed.        . fish oil-omega-3 fatty acids 1000 MG capsule Take 2 g by mouth daily.        Marland Kitchen LORazepam (ATIVAN) 0.5 MG tablet Take 0.5-1 tablets (0.25-0.5 mg total) by mouth 2 (two) times daily as needed for anxiety.  30 tablet  0  . Melatonin 1 MG CAPS Take 1 capsule by mouth at bedtime as needed.        Marland Kitchen omeprazole (PRILOSEC) 20 MG capsule Take 20 mg by mouth daily as needed.          Allergies  Allergen Reactions  . Ibandronate Sodium     REACTION: aches  . Penicillins     Past Medical History  Diagnosis Date  . Diverticulitis     colon  . Hyperlipidemia   . Hypertension   . Osteoporosis   . GERD (gastroesophageal reflux disease)   . Menopausal symptoms   . Osteoarthrosis involving, or with mention of more than one site, but not specified as generalized, multiple sites     Past Surgical History  Procedure Date  . Dilation and curettage of uterus     rupture- peritonitis & hysterectomy 12/79  . Vaginal delivery     x2  . Breast biopsy     Left breast calcifications removed    Family History  Problem Relation Age of Onset  . Osteoporosis Mother   . Coronary artery disease Father   . Breast cancer Sister   . Cancer Maternal Grandfather     colon cancer  . Diabetes Neg Hx   . Hypertension Neg Hx     History   Social History  . Marital Status:  Divorced    Spouse Name: N/A    Number of Children: 2  . Years of Education: N/A   Occupational History  . 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 part-time Armed forces operational officer [employer]    Social History Main Topics  . Smoking status: Never Smoker   . Smokeless tobacco: Never Used  . Alcohol Use: Yes     occasional  . Drug Use: Not on file  . Sexually Active: Not on file   Other Topics Concern  . Not on file   Social History Narrative   No living willWould want daughter, Veronica Stanley, to make health care decisions for herWould accept resuscitation attemptsNot sure about tube feedings   Review of Systems Appetite is better now Sleeping okay     Objective:   Physical Exam  Skin:          Fairly broad laceration with skin separation of 5-53mm Black eschar in wound Very mild surrounding redness with out striking tenderness Not overly warm          Assessment & Plan:

## 2011-10-04 NOTE — Assessment & Plan Note (Signed)
Fairly severe laceration to visible bone Wound well apposed No signs of secondary infection  Will have her cover with saran to shower Sutures out in 1 week

## 2011-10-11 ENCOUNTER — Ambulatory Visit (INDEPENDENT_AMBULATORY_CARE_PROVIDER_SITE_OTHER): Payer: Medicare Other | Admitting: Internal Medicine

## 2011-10-11 ENCOUNTER — Encounter: Payer: Self-pay | Admitting: Internal Medicine

## 2011-10-11 VITALS — BP 140/80 | HR 82 | Temp 98.4°F | Wt 226.0 lb

## 2011-10-11 DIAGNOSIS — S81812A Laceration without foreign body, left lower leg, initial encounter: Secondary | ICD-10-CM

## 2011-10-11 DIAGNOSIS — L03119 Cellulitis of unspecified part of limb: Secondary | ICD-10-CM

## 2011-10-11 DIAGNOSIS — L02419 Cutaneous abscess of limb, unspecified: Secondary | ICD-10-CM

## 2011-10-11 DIAGNOSIS — S91009A Unspecified open wound, unspecified ankle, initial encounter: Secondary | ICD-10-CM

## 2011-10-11 MED ORDER — CLINDAMYCIN HCL 300 MG PO CAPS: 300.0000 mg | ORAL_CAPSULE | Freq: Three times a day (TID) | ORAL | Status: DC

## 2011-10-11 NOTE — Assessment & Plan Note (Signed)
Increased redness and tenderness Will proceed with Rx with clindamycin

## 2011-10-11 NOTE — Assessment & Plan Note (Signed)
Sutures removed with difficulty---hard to see and several were running stitches Painful but tolerated Steri-strips applied

## 2011-10-11 NOTE — Patient Instructions (Signed)
Call Friday if the redness and tenderness is not significantly better By next week, you can shower without covering the wound. Please snip off the ends of the steristrips as the peel up over time

## 2011-10-11 NOTE — Progress Notes (Signed)
  Subjective:    Patient ID: Veronica Stanley, female    DOB: [birth year], 66 y.o.   MRN: [id number]  HPI Here for follow up Has had some pain--when walking More redness now No fever No sweats or chills Has had some serous drainage  Current Outpatient Prescriptions on File Prior to Visit  Medication Sig Dispense Refill  . Cholecalciferol (VITAMIN D) 1000 UNITS capsule Take 1,000 Units by mouth daily.        . famotidine (PEPCID) 20 MG tablet Take 20 mg by mouth at bedtime as needed.        . fish oil-omega-3 fatty acids 1000 MG capsule Take 2 g by mouth daily.        . Melatonin 1 MG CAPS Take 1 capsule by mouth at bedtime as needed.        Marland Kitchen omeprazole (PRILOSEC) 20 MG capsule Take 20 mg by mouth daily as needed.          Allergies  Allergen Reactions  . Ibandronate Sodium     REACTION: aches  . Penicillins     Past Medical History  Diagnosis Date  . Diverticulitis     colon  . Hyperlipidemia   . Hypertension   . Osteoporosis   . GERD (gastroesophageal reflux disease)   . Menopausal symptoms   . Osteoarthrosis involving, or with mention of more than one site, but not specified as generalized, multiple sites     Past Surgical History  Procedure Date  . Dilation and curettage of uterus     rupture- peritonitis & hysterectomy 12/79  . Vaginal delivery     x2  . Breast biopsy     Left breast calcifications removed    Family History  Problem Relation Age of Onset  . Osteoporosis Mother   . Coronary artery disease Father   . Breast cancer Sister   . Cancer Maternal Grandfather     colon cancer  . Diabetes Neg Hx   . Hypertension Neg Hx     History   Social History  . Marital Status: Divorced    Spouse Name: N/A    Number of Children: 2  . Years of Education: N/A   Occupational History  . 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 part-time Armed forces operational officer court system    Social History Main Topics  . Smoking status: Never Smoker   . Smokeless tobacco:  Never Used  . Alcohol Use: Yes     occasional  . Drug Use: Not on file  . Sexually Active: Not on file   Other Topics Concern  . Not on file   Social History Narrative   No living willWould want daughter, Veronica Stanley, to make health care decisions for herWould accept resuscitation attemptsNot sure about tube feedings   Review of Systems No nausea or vomiting Able to eat     Objective:   Physical Exam  Constitutional: She appears well-developed and well-nourished. No distress.  Skin:       Increased redness around wound but still not hot Mild tenderness around the wound--but exquisitely tender with suture removal Slight serous drainage          Assessment & Plan:

## 2011-10-23 ENCOUNTER — Encounter: Payer: Self-pay | Admitting: Internal Medicine

## 2011-10-23 ENCOUNTER — Ambulatory Visit (INDEPENDENT_AMBULATORY_CARE_PROVIDER_SITE_OTHER): Payer: Medicare Other | Admitting: Internal Medicine

## 2011-10-23 VITALS — BP 158/80 | HR 92 | Temp 98.4°F | Wt 229.0 lb

## 2011-10-23 DIAGNOSIS — L03119 Cellulitis of unspecified part of limb: Secondary | ICD-10-CM

## 2011-10-23 DIAGNOSIS — L02419 Cutaneous abscess of limb, unspecified: Secondary | ICD-10-CM

## 2011-10-23 MED ORDER — CLINDAMYCIN HCL 300 MG PO CAPS: 300.0000 mg | ORAL_CAPSULE | Freq: Three times a day (TID) | ORAL | Status: AC

## 2011-10-23 NOTE — Assessment & Plan Note (Signed)
Still with redness and some tenderness Mild (what sounds like serous) drainage Not clearly infected Will continue local care clinda refill in case it worsens

## 2011-10-23 NOTE — Progress Notes (Signed)
  Subjective:    Patient ID: Veronica Stanley, female    DOB: 30-Jul-1945, 66 y.o.   MRN: [id number]  HPI Still has left calf pain Urged to come back by family and friends  Pain in ankle below the level of the wound No fever Has some discharge from the wound---liquidy yellow Still some tenderness along lateral edges of the wound  Current Outpatient Prescriptions on File Prior to Visit  Medication Sig Dispense Refill  . Cholecalciferol (VITAMIN D) 1000 UNITS capsule Take 1,000 Units by mouth daily.        . famotidine (PEPCID) 20 MG tablet Take 20 mg by mouth at bedtime as needed.        . fish oil-omega-3 fatty acids 1000 MG capsule Take 2 g by mouth daily.        Marland Kitchen LORazepam (ATIVAN) 0.5 MG tablet Take 0.25-0.5 mg by mouth 2 (two) times daily as needed.      . Melatonin 1 MG CAPS Take 1 capsule by mouth at bedtime as needed.        Marland Kitchen omeprazole (PRILOSEC) 20 MG capsule Take 20 mg by mouth daily as needed.          Allergies  Allergen Reactions  . Ibandronate Sodium     REACTION: aches  . Penicillins     Past Medical History  Diagnosis Date  . Diverticulitis     colon  . Hyperlipidemia   . Hypertension   . Osteoporosis   . GERD (gastroesophageal reflux disease)   . Menopausal symptoms   . Osteoarthrosis involving, or with mention of more than one site, but not specified as generalized, multiple sites     Past Surgical History  Procedure Date  . Dilation and curettage of uterus     rupture- peritonitis & hysterectomy 12/79  . Vaginal delivery     x2  . Breast biopsy     Left breast calcifications removed    Family History  Problem Relation Age of Onset  . Osteoporosis Mother   . Coronary artery disease Father   . Breast cancer Sister   . Cancer Maternal Grandfather     colon cancer  . Diabetes Neg Hx   . Hypertension Neg Hx     History   Social History  . Marital Status: Divorced    Spouse Name: N/A    Number of Children: 2  . Years of Education: N/A    Occupational History  . 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 part-time Armed forces operational officer court system    Social History Main Topics  . Smoking status: Never Smoker   . Smokeless tobacco: Never Used  . Alcohol Use: Yes     occasional  . Drug Use: Not on file  . Sexually Active: Not on file   Other Topics Concern  . Not on file   Social History Narrative   No living willWould want daughter, Veronica Stanley, to make health care decisions for herWould accept resuscitation attemptsNot sure about tube feedings   Review of Systems No nausea Feels okay    Objective:   Physical Exam  Musculoskeletal:       Mild lateral malleolar swelling on left---not tender  Skin:       Wound has black eschar that looks okay No obvious drainage Mild surrounding redness Mild tenderness along the medial border          Assessment & Plan:

## 2012-03-22 ENCOUNTER — Ambulatory Visit: Payer: Medicare Other | Admitting: Internal Medicine

## 2012-04-15 ENCOUNTER — Encounter: Payer: Self-pay | Admitting: Internal Medicine

## 2012-04-15 ENCOUNTER — Ambulatory Visit (INDEPENDENT_AMBULATORY_CARE_PROVIDER_SITE_OTHER): Payer: Medicare Other | Admitting: Internal Medicine

## 2012-04-15 VITALS — BP 158/90 | HR 69 | Temp 97.7°F | Wt 232.0 lb

## 2012-04-15 DIAGNOSIS — K219 Gastro-esophageal reflux disease without esophagitis: Secondary | ICD-10-CM

## 2012-04-15 DIAGNOSIS — M159 Polyosteoarthritis, unspecified: Secondary | ICD-10-CM

## 2012-04-15 DIAGNOSIS — I10 Essential (primary) hypertension: Secondary | ICD-10-CM

## 2012-04-15 DIAGNOSIS — E785 Hyperlipidemia, unspecified: Secondary | ICD-10-CM

## 2012-04-15 LAB — TSH: TSH: 2.31 u[IU]/mL (ref 0.35–5.50)

## 2012-04-15 LAB — CBC WITH DIFFERENTIAL/PLATELET
Basophils Absolute: 0 10*3/uL (ref 0.0–0.1)
Eosinophils Relative: 2.3 % (ref 0.0–5.0)
HCT: 39.3 % (ref 36.0–46.0)
Hemoglobin: 12.9 g/dL (ref 12.0–15.0)
Lymphocytes Relative: 34.1 % (ref 12.0–46.0)
Lymphs Abs: 2.3 10*3/uL (ref 0.7–4.0)
Monocytes Relative: 9.8 % (ref 3.0–12.0)
Neutro Abs: 3.6 10*3/uL (ref 1.4–7.7)
Platelets: 255 10*3/uL (ref 150.0–400.0)
WBC: 6.8 10*3/uL (ref 4.5–10.5)

## 2012-04-15 LAB — HEPATIC FUNCTION PANEL
AST: 17 U/L (ref 0–37)
Albumin: 3.5 g/dL (ref 3.5–5.2)
Alkaline Phosphatase: 63 U/L (ref 39–117)
Bilirubin, Direct: 0.1 mg/dL (ref 0.0–0.3)

## 2012-04-15 LAB — BASIC METABOLIC PANEL
Calcium: 9.1 mg/dL (ref 8.4–10.5)
GFR: 81.6 mL/min (ref >60.00)
Potassium: 4.4 mEq/L (ref 3.5–5.1)
Sodium: 139 mEq/L (ref 135–145)

## 2012-04-15 LAB — LIPID PANEL
HDL: 47.5 mg/dL (ref >39.00)
LDL Cholesterol: 113 mg/dL — ABNORMAL HIGH (ref 0–99)
VLDL: 24.6 mg/dL (ref 0.0–40.0)

## 2012-04-15 NOTE — Assessment & Plan Note (Signed)
Controlled with the PPI 

## 2012-04-15 NOTE — Assessment & Plan Note (Signed)
Doesn't seem that she is close to surgery for left hip Okay to take aleve Needs to do more exercise

## 2012-04-15 NOTE — Progress Notes (Signed)
Subjective:    Patient ID: Veronica Stanley, female    DOB: 1945/10/13, 66 y.o.   MRN: [id number]  HPI Leg is all healed up  Having some left hip pain Pulled her groin and did PT in past Saw [facility] orthopedist----x-ray shows arthritis Using 2 aleve at times--does help some Some trouble with stiffness when first getting up. Notes left foot turns in  No exercise lately (stopped after leg injury) Discussed this  No chest pain No SOB No dizziness or syncope  New reading glasses that were tight--noted facial flushing and abnormal squiggling line in right eye----then blackened Tried a cookie--wondered if her sugar could be low. Then it went away after 20 minutes No headaches Did have migraines years ago  Current Outpatient Prescriptions on File Prior to Visit  Medication Sig Dispense Refill  . Cholecalciferol (VITAMIN D) 1000 UNITS capsule Take 1,000 Units by mouth daily.        . famotidine (PEPCID) 20 MG tablet Take 20 mg by mouth at bedtime as needed.        . fish oil-omega-3 fatty acids 1000 MG capsule Take 2 g by mouth daily.        . Melatonin 1 MG CAPS Take 1 capsule by mouth at bedtime as needed.        Marland Kitchen omeprazole (PRILOSEC) 20 MG capsule Take 20 mg by mouth daily as needed.          Allergies  Allergen Reactions  . Ibandronate Sodium     REACTION: aches  . Penicillins     Past Medical History  Diagnosis Date  . Diverticulitis     colon  . Hyperlipidemia   . Hypertension   . Osteoporosis   . GERD (gastroesophageal reflux disease)   . Menopausal symptoms   . Osteoarthrosis involving, or with mention of more than one site, but not specified as generalized, multiple sites     Past Surgical History  Procedure Date  . Dilation and curettage of uterus     rupture- peritonitis & hysterectomy 12/79  . Vaginal delivery     x2  . Breast biopsy     Left breast calcifications removed    Family History  Problem Relation Age of Onset  . Osteoporosis Mother   . Coronary  artery disease Father   . Breast cancer Sister   . Cancer Maternal Grandfather     colon cancer  . Diabetes Neg Hx   . Hypertension Neg Hx     History   Social History  . Marital Status: Divorced    Spouse Name: N/A    Number of Children: 2  . Years of Education: N/A   Occupational History  . 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 part-time Armed forces operational officer court system    Social History Main Topics  . Smoking status: Never Smoker   . Smokeless tobacco: Never Used  . Alcohol Use: Yes     occasional  . Drug Use: Not on file  . Sexually Active: Not on file   Other Topics Concern  . Not on file   Social History Narrative   No living willWould want daughter, Veronica Stanley, to make health care decisions for herWould accept resuscitation attemptsNot sure about tube feedings   Review of Systems No stomach pain No signs of GI bleed Weight stable Sleep isn't great--not sure why. Rotates among 3 bedrooms    Objective:   Physical Exam  Constitutional: She appears well-developed and well-nourished. No distress.  Neck: Normal range of motion. Neck supple. No thyromegaly present.  Cardiovascular: Normal rate, regular rhythm and normal heart sounds.  Exam reveals no gallop.   No murmur heard. Pulmonary/Chest: Effort normal and breath sounds normal. No respiratory distress. She has no wheezes. She has no rales.  Musculoskeletal: She exhibits no edema and no tenderness.       Mild decreased internal rotation in left hip---not severe  Lymphadenopathy:    She has no cervical adenopathy.  Psychiatric: She has a normal mood and affect. Her behavior is normal.          Assessment & Plan:

## 2012-04-15 NOTE — Assessment & Plan Note (Signed)
BP Readings from Last 3 Encounters:  04/15/12 158/90  10/23/11 158/80  10/11/11 140/80   Still elevated but not severely  She again asks to work on fitness before meds

## 2012-04-15 NOTE — Assessment & Plan Note (Signed)
Lab Results  Component Value Date   LDLCALC 120* 04/23/2009   Discussed Rx  Will hold off on primary prevention

## 2012-04-17 ENCOUNTER — Encounter: Payer: Self-pay | Admitting: *Deleted

## 2012-04-19 DIAGNOSIS — C44611 Basal cell carcinoma of skin of unspecified upper limb, including shoulder: Secondary | ICD-10-CM

## 2012-04-19 HISTORY — DX: Basal cell carcinoma of skin of unspecified upper limb, including shoulder: C44.611

## 2012-07-02 ENCOUNTER — Telehealth: Payer: Self-pay | Admitting: Internal Medicine

## 2012-07-02 ENCOUNTER — Emergency Department: Payer: Self-pay | Admitting: Emergency Medicine

## 2012-07-02 NOTE — Telephone Encounter (Signed)
Patient Information:  Caller Name: Eeva  Phone: [phone]  Patient: Veronica Stanley  Gender: Female  DOB: 12-Aug-1945  Age: 67 Years  PCP: [facility])  Office Follow Up:  Does the office need to follow up with this patient?: No  Instructions For The Office: N/A  RN Note:  Symptoms began after sitting on bleachers for 4-5 hours.  Feels OK while sitting; severe pain when moves like getting up or sitting down. Pain rated 10/10 when moves, stands or walks. Some relief after using Acetaminophen arthrits. Currently at work.   Symptoms  Reason For Call & Symptoms: Severe, intermittent spasms of left hip that radiates into groin and left thigh.  Reviewed Health History In EMR: Yes  Reviewed Medications In EMR: Yes  Reviewed Allergies In EMR: Yes  Reviewed Surgeries / Procedures: Yes  Date of Onset of Symptoms: 06/28/2012  Treatments Tried: 650 mg Accetaminophen po q 4-6 hours  Treatments Tried Worked: Yes  Guideline(s) Used:  Leg Pain  Disposition Per Guideline:   Go to ED Now (or to Office with PCP Approval)  Reason For Disposition Reached:   Thigh, calf, or ankle swelling in only one leg  Advice Given:  N/A  RN Overrode Recommendation:  Go To ED  Will go to [facility] ED due to closer to home.

## 2012-07-03 NOTE — Telephone Encounter (Signed)
Spoke with patient and no blood clot, gave valium and ibuprofen, leg pain is better. Pt scheduled f/u for 07/05/12

## 2012-07-03 NOTE — Telephone Encounter (Signed)
Please check on her today

## 2012-07-05 ENCOUNTER — Ambulatory Visit (INDEPENDENT_AMBULATORY_CARE_PROVIDER_SITE_OTHER): Payer: Medicare Other | Admitting: Internal Medicine

## 2012-07-05 ENCOUNTER — Encounter: Payer: Self-pay | Admitting: Internal Medicine

## 2012-07-05 VITALS — BP 144/84 | HR 72 | Temp 98.1°F | Wt 238.0 lb

## 2012-07-05 DIAGNOSIS — S76219A Strain of adductor muscle, fascia and tendon of unspecified thigh, initial encounter: Secondary | ICD-10-CM

## 2012-07-05 DIAGNOSIS — IMO0002 Reserved for concepts with insufficient information to code with codable children: Secondary | ICD-10-CM

## 2012-07-05 NOTE — Patient Instructions (Signed)
Please take ibuprofen 600mg  three times a day for 2 weeks Then start an exercise program regularly. You should focus on core strength and let the trainer know you may have an element of hip flexor tendonitis

## 2012-07-05 NOTE — Progress Notes (Signed)
Subjective:    Patient ID: Veronica Stanley, female    DOB: [birth year], 67 y.o.   MRN: [id number]  HPI Micah Flesher to [facility] for left leg pain Diagnosed with strain Given diazepam and ibuprofen  Similar to past injury but worse Is some better now Did sit on hard bleachers a week ago at a ballgame Worse after this and then progressed Finally went to ER Had been trying acetaminophen with some relief  Sharp and severe pain in left inguinal area  Current Outpatient Prescriptions on File Prior to Visit  Medication Sig Dispense Refill  . Cholecalciferol (VITAMIN D) 1000 UNITS capsule Take 1,000 Units by mouth daily.        . famotidine (PEPCID) 20 MG tablet Take 20 mg by mouth at bedtime as needed.        . fish oil-omega-3 fatty acids 1000 MG capsule Take 2 g by mouth daily.        . Melatonin 1 MG CAPS Take 1 capsule by mouth at bedtime as needed.        Marland Kitchen omeprazole (PRILOSEC) 20 MG capsule Take 20 mg by mouth daily as needed.          Allergies  Allergen Reactions  . Ibandronate Sodium     REACTION: aches  . Penicillins     Past Medical History  Diagnosis Date  . Diverticulitis     colon  . Hyperlipidemia   . Hypertension   . Osteoporosis   . GERD (gastroesophageal reflux disease)   . Menopausal symptoms   . Osteoarthrosis involving, or with mention of more than one site, but not specified as generalized, multiple sites     Past Surgical History  Procedure Date  . Dilation and curettage of uterus     rupture- peritonitis & hysterectomy 12/79  . Vaginal delivery     x2  . Breast biopsy     Left breast calcifications removed    Family History  Problem Relation Age of Onset  . Osteoporosis Mother   . Coronary artery disease Father   . Breast cancer Sister   . Cancer Maternal Grandfather     colon cancer  . Diabetes Neg Hx   . Hypertension Neg Hx     History   Social History  . Marital Status: Divorced    Spouse Name: N/A    Number of Children: 2  . Years of  Education: N/A   Occupational History  . 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 part-time Armed forces operational officer court system    Social History Main Topics  . Smoking status: Never Smoker   . Smokeless tobacco: Never Used  . Alcohol Use: Yes     Comment: occasional  . Drug Use: Not on file  . Sexually Active: Not on file   Other Topics Concern  . Not on file   Social History Narrative   No living willWould want daughter, [name], to make health care decisions for herWould accept resuscitation attemptsNot sure about tube feedings   Review of Systems No exercise Has gained significant weight Plans to restart at [facility]---free with her insurance    Objective:   Physical Exam  Constitutional: She appears well-developed and well-nourished. No distress.  Musculoskeletal:       No spine tenderness Mild left inguinal tenderness Normal flexion and rotation of hips  Neurological:       Gait is normal No weakness in legs  Assessment & Plan:

## 2012-07-05 NOTE — Assessment & Plan Note (Signed)
Similar to past pain May have element of hip flexor tendonitis Will use ibuprofen for 2 weeks Needs to start exercise program

## 2012-07-15 ENCOUNTER — Encounter: Payer: Self-pay | Admitting: Internal Medicine

## 2012-07-26 ENCOUNTER — Ambulatory Visit: Payer: Self-pay | Admitting: Internal Medicine

## 2012-07-29 ENCOUNTER — Encounter: Payer: Self-pay | Admitting: Internal Medicine

## 2012-07-30 ENCOUNTER — Encounter: Payer: Self-pay | Admitting: *Deleted

## 2012-10-16 ENCOUNTER — Encounter: Payer: Self-pay | Admitting: Internal Medicine

## 2012-10-16 ENCOUNTER — Ambulatory Visit (INDEPENDENT_AMBULATORY_CARE_PROVIDER_SITE_OTHER): Payer: Medicare Other | Admitting: Internal Medicine

## 2012-10-16 VITALS — BP 148/100 | HR 75 | Temp 98.3°F | Wt 233.0 lb

## 2012-10-16 DIAGNOSIS — M810 Age-related osteoporosis without current pathological fracture: Secondary | ICD-10-CM

## 2012-10-16 DIAGNOSIS — M159 Polyosteoarthritis, unspecified: Secondary | ICD-10-CM

## 2012-10-16 DIAGNOSIS — Z1211 Encounter for screening for malignant neoplasm of colon: Secondary | ICD-10-CM

## 2012-10-16 DIAGNOSIS — K219 Gastro-esophageal reflux disease without esophagitis: Secondary | ICD-10-CM

## 2012-10-16 DIAGNOSIS — I10 Essential (primary) hypertension: Secondary | ICD-10-CM

## 2012-10-16 DIAGNOSIS — Z0000 Encounter for general adult medical examination without abnormal findings: Secondary | ICD-10-CM

## 2012-10-16 NOTE — Assessment & Plan Note (Signed)
Better Only uses meds occasionally

## 2012-10-16 NOTE — Assessment & Plan Note (Signed)
Hip better Prn ibuprofen helps when needed

## 2012-10-16 NOTE — Assessment & Plan Note (Signed)
Healthy Working on fitness now as today is last day of work Had [employer]	[place] UTD with imms Will set up colon with Dr Mechele Collin

## 2012-10-16 NOTE — Progress Notes (Signed)
Subjective:    Patient ID: Veronica Stanley, female    DOB: [birth year], 67 y.o.   MRN: [id number]  HPI Here for physical Reviewed advanced directives Retiring after work today  Hip is better Does have some pain with hard labor (like clearing storm damage). Then uses ibuprofen Tries to walk regularly  Stomach is fine Only uses the pepcid prn now  Current Outpatient Prescriptions on File Prior to Visit  Medication Sig Dispense Refill  . Cholecalciferol (VITAMIN D) 1000 UNITS capsule Take 1,000 Units by mouth daily.        . famotidine (PEPCID) 20 MG tablet Take 20 mg by mouth at bedtime as needed.        . fish oil-omega-3 fatty acids 1000 MG capsule Take 2 g by mouth daily.         No current facility-administered medications on file prior to visit.    Allergies  Allergen Reactions  . Ibandronate Sodium     REACTION: aches  . Penicillins     Past Medical History  Diagnosis Date  . Diverticulitis     colon  . Hyperlipidemia   . Hypertension   . Osteoporosis   . GERD (gastroesophageal reflux disease)   . Menopausal symptoms   . Osteoarthrosis involving, or with mention of more than one site, but not specified as generalized, multiple sites   . Basal cell carcinoma, arm 11/13    Dr Cheree Ditto    Past Surgical History  Procedure Laterality Date  . Dilation and curettage of uterus      rupture- peritonitis & hysterectomy 12/79  . Vaginal delivery      x2  . Breast biopsy      Left breast calcifications removed    Family History  Problem Relation Age of Onset  . Osteoporosis Mother   . Coronary artery disease Father   . Breast cancer Sister   . Cancer Maternal Grandfather     colon cancer  . Diabetes Neg Hx   . Hypertension Neg Hx     History   Social History  . Marital Status: Divorced    Spouse Name: N/A    Number of Children: 2  . Years of Education: N/A   Occupational History  . 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       Social History Main Topics  . Smoking status: Never Smoker   . Smokeless tobacco: Never Used  . Alcohol Use: Yes     Comment: occasional  . Drug Use: Not on file  . Sexually Active: Not on file   Other Topics Concern  . Not on file   Social History Narrative   No living will   Would want daughter, [name], to make health care decisions for her   Would accept resuscitation attempts   Not sure about tube feedings   Review of Systems  Constitutional: Negative for fatigue and unexpected weight change.       Weight down 5# from height but still up from last year Wears seat belt  HENT: Negative for hearing loss, congestion, rhinorrhea, dental problem and tinnitus.        Regular with dentist  Eyes: Negative for visual disturbance.       No diplopia or unilateral vision loss   Respiratory: Negative for cough, chest tightness and shortness of breath.   Cardiovascular: Negative for chest pain, palpitations and leg swelling.  Endocrine: Negative for cold intolerance and heat intolerance.  Genitourinary:  Negative for dysuria and difficulty urinating.       No incontinence No sex-no problem  Musculoskeletal: Positive for arthralgias. Negative for back pain and joint swelling.  Skin: Negative for rash.       Recent basal cell carcinoma removal Regular now with dermatologist  Allergic/Immunologic: Negative for environmental allergies and immunocompromised state.  Neurological: Negative for dizziness, syncope, weakness, light-headedness, numbness and headaches.  Hematological: Negative for adenopathy. Does not bruise/bleed easily.  Psychiatric/Behavioral: Negative for sleep disturbance and dysphoric mood. The patient is not nervous/anxious.        Objective:   Physical Exam  Constitutional: She is oriented to person, place, and time. She appears well-developed and well-nourished. No distress.  HENT:  Head: Normocephalic and atraumatic.  Right Ear: External ear normal.  Left Ear:  External ear normal.  Mouth/Throat: Oropharynx is clear and moist. No oropharyngeal exudate.  Eyes: Conjunctivae and EOM are normal. Pupils are equal, round, and reactive to light.  Neck: Normal range of motion. Neck supple. No thyromegaly present.  Cardiovascular: Normal rate, regular rhythm, normal heart sounds and intact distal pulses.  Exam reveals no gallop.   No murmur heard. Pulmonary/Chest: Effort normal and breath sounds normal. No respiratory distress. She has no wheezes. She has no rales.  Abdominal: Soft. There is no tenderness.  Genitourinary:  No breast masses or lesions  Musculoskeletal: She exhibits no edema and no tenderness.  Lymphadenopathy:    She has no cervical adenopathy.    She has no axillary adenopathy.  Neurological: She is alert and oriented to person, place, and time.  Skin: No rash noted. No erythema.  Psychiatric: She has a normal mood and affect. Her behavior is normal.          Assessment & Plan:

## 2012-10-16 NOTE — Assessment & Plan Note (Signed)
BP Readings from Last 3 Encounters:  10/16/12 148/100  07/05/12 144/84  04/15/12 158/90   Still not at goal She is determined to improve with her fitness Will start meds if still high next time

## 2012-10-16 NOTE — Assessment & Plan Note (Signed)
Vitamin D only Recheck DEXA in 3-5 years

## 2012-10-24 ENCOUNTER — Encounter: Payer: Self-pay | Admitting: Internal Medicine

## 2012-10-24 ENCOUNTER — Ambulatory Visit (INDEPENDENT_AMBULATORY_CARE_PROVIDER_SITE_OTHER): Payer: Medicare Other | Admitting: Internal Medicine

## 2012-10-24 ENCOUNTER — Telehealth: Payer: Self-pay | Admitting: Internal Medicine

## 2012-10-24 VITALS — BP 158/80 | HR 89 | Temp 98.6°F | Wt 229.0 lb

## 2012-10-24 DIAGNOSIS — J039 Acute tonsillitis, unspecified: Secondary | ICD-10-CM

## 2012-10-24 LAB — POCT RAPID STREP A (OFFICE): Rapid Strep A Screen: NEGATIVE

## 2012-10-24 MED ORDER — AZITHROMYCIN 250 MG PO TABS: ORAL_TABLET | ORAL | Status: DC

## 2012-10-24 NOTE — Telephone Encounter (Signed)
Patient Information:  Caller Name: Medea  Phone: [phone]  Patient: Veronica Stanley, Veronica Stanley  Gender: Female  DOB: Jun 08, 1946  Age: 67 Years  PCP: Tillman Abide [facility])  Office Follow Up:  Does the office need to follow up with this patient?: No  Instructions For The Office: N/A  RN Note:  Advised patient to call back before appointment if she develops any shortness of breath or trouble swallowing.  Symptoms  Reason For Call & Symptoms: Reports sore throat, headache, fever at times, body aches, white patches on back of throat. Also reports swollen glands under jaw line.  Reviewed Health History In EMR: N/A  Reviewed Medications In EMR: N/A  Reviewed Allergies In EMR: Yes  Reviewed Surgeries / Procedures: Yes  Date of Onset of Symptoms: 10/22/2012  Treatments Tried: Alkaseltzer Cold Plus, Ibuprofen  Treatments Tried Worked: Yes  Guideline(s) Used:  Sore Throat  Disposition Per Guideline:   See Today in Office  Reason For Disposition Reached:   Pus on tonsils (back of throat) and swollen neck lymph nodes ("glands")  Advice Given:  N/A  Patient Will Follow Care Advice:  YES  Appointment Scheduled:  10/24/2012 16:00:00 Appointment Scheduled Provider:  Tillman Abide (Family Practice)

## 2012-10-24 NOTE — Telephone Encounter (Signed)
Will evaluate at OV 

## 2012-10-24 NOTE — Assessment & Plan Note (Signed)
Classic presentation  Exposed to sister who is exposed to kids Fever at home, exudative tonsils, lack of cough, adenopathy Rapid strep negative though  Will send culture Empiric Rx with z-pak Stop if culture negative

## 2012-10-24 NOTE — Patient Instructions (Signed)
If the strep culture is negative, you can stop the antibiotic

## 2012-10-24 NOTE — Progress Notes (Signed)
Subjective:    Patient ID: Veronica Stanley, female    DOB: 1946-01-08, 67 y.o.   MRN: [id number]  HPI Has been sick for 2 days Exposed to sister who was sick 3 days before  Swelling inside throat with white patches Achy---couldn't sleep Head pain  Some fever but couldn't check it Some relief with OTC meds but then worsened today  Slight cough No ear pain but can feel congestion in Eustachian tube Thinks she has swollen glands  Current Outpatient Prescriptions on File Prior to Visit  Medication Sig Dispense Refill  . Cholecalciferol (VITAMIN D) 1000 UNITS capsule Take 1,000 Units by mouth daily.        . famotidine (PEPCID) 20 MG tablet Take 20 mg by mouth at bedtime as needed.        . fish oil-omega-3 fatty acids 1000 MG capsule Take 2 g by mouth daily.        Marland Kitchen ibuprofen (ADVIL,MOTRIN) 200 MG tablet Take 200 mg by mouth as needed for pain.       No current facility-administered medications on file prior to visit.    Allergies  Allergen Reactions  . Ibandronate Sodium     REACTION: aches  . Penicillins     Past Medical History  Diagnosis Date  . Diverticulitis     colon  . Hyperlipidemia   . Hypertension   . Osteoporosis   . GERD (gastroesophageal reflux disease)   . Menopausal symptoms   . Osteoarthrosis involving, or with mention of more than one site, but not specified as generalized, multiple sites   . Basal cell carcinoma, arm 11/13    Dr Cheree Ditto    Past Surgical History  Procedure Laterality Date  . Dilation and curettage of uterus      rupture- peritonitis & hysterectomy 12/79  . Vaginal delivery      x2  . Breast biopsy      Left breast calcifications removed    Family History  Problem Relation Age of Onset  . Osteoporosis Mother   . Coronary artery disease Father   . Breast cancer Sister   . Cancer Maternal Grandfather     colon cancer  . Diabetes Neg Hx   . Hypertension Neg Hx     History   Social History  . Marital Status: Divorced   Spouse Name: N/A    Number of Children: 2  . Years of Education: N/A   Occupational History  . 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      Social History Main Topics  . Smoking status: Never Smoker   . Smokeless tobacco: Never Used  . Alcohol Use: Yes     Comment: occasional  . Drug Use: Not on file  . Sexually Active: Not on file   Other Topics Concern  . Not on file   Social History Narrative   No living will   Would want daughter, [name], to make health care decisions for her   Would accept resuscitation attempts   Not sure about tube feedings   Review of Systems No rash No vomiting or diarrhea     Objective:   Physical Exam  Constitutional: She appears well-developed and well-nourished. No distress.  HENT:  Nose: Nose normal.  Classic 2+ injected tonsils with exudates TMs normal   Neck: Normal range of motion. Neck supple.  Slightly tender bilateral anterior cervical nodes  Pulmonary/Chest: Effort normal and breath sounds normal. No respiratory distress. She  has no wheezes. She has no rales.  Lymphadenopathy:    She has cervical adenopathy.  Skin: No rash noted.          Assessment & Plan:

## 2013-02-24 ENCOUNTER — Ambulatory Visit: Payer: Self-pay | Admitting: Unknown Physician Specialty

## 2013-02-28 ENCOUNTER — Encounter: Payer: Self-pay | Admitting: Internal Medicine

## 2013-03-10 ENCOUNTER — Encounter: Payer: Self-pay | Admitting: Internal Medicine

## 2013-04-17 ENCOUNTER — Encounter: Payer: Self-pay | Admitting: Internal Medicine

## 2013-04-17 ENCOUNTER — Ambulatory Visit (INDEPENDENT_AMBULATORY_CARE_PROVIDER_SITE_OTHER): Payer: Medicare Other | Admitting: Internal Medicine

## 2013-04-17 VITALS — BP 140/90 | HR 78 | Temp 98.1°F | Wt 231.0 lb

## 2013-04-17 DIAGNOSIS — I10 Essential (primary) hypertension: Secondary | ICD-10-CM

## 2013-04-17 DIAGNOSIS — K219 Gastro-esophageal reflux disease without esophagitis: Secondary | ICD-10-CM

## 2013-04-17 DIAGNOSIS — L570 Actinic keratosis: Secondary | ICD-10-CM

## 2013-04-17 NOTE — Assessment & Plan Note (Signed)
Single lesion on chest treated with liquid nitrogen 45 seconds x 2 Tolerated well Discussed home care

## 2013-04-17 NOTE — Progress Notes (Signed)
Subjective:    Patient ID: Veronica Stanley, female    DOB: 09-Dec-1945, 67 y.o.   MRN: [id number]  HPI Here for follow up  Has given up coffee (had been using 2 tablespoons sugar per mug) Walking at a track 3 times a week for 30 minutes Has tried to be more careful with her diet---avoiding sweets  Acid reflux is better since off the coffee Had EGD with gastritis found--had H pylori Being treated for this  No chest pain No SOB No dizziness or syncope No edema  Current Outpatient Prescriptions on File Prior to Visit  Medication Sig Dispense Refill  . Cholecalciferol (VITAMIN D) 1000 UNITS capsule Take 1,000 Units by mouth daily.        . famotidine (PEPCID) 20 MG tablet Take 20 mg by mouth at bedtime as needed.        . fish oil-omega-3 fatty acids 1000 MG capsule Take 2 g by mouth daily.        Marland Kitchen ibuprofen (ADVIL,MOTRIN) 200 MG tablet Take 200 mg by mouth as needed for pain.       No current facility-administered medications on file prior to visit.    Allergies  Allergen Reactions  . Ibandronate Sodium     REACTION: aches  . Penicillins     Past Medical History  Diagnosis Date  . Diverticulitis     colon  . Hyperlipidemia   . Hypertension   . Osteoporosis   . GERD (gastroesophageal reflux disease)   . Menopausal symptoms   . Osteoarthrosis involving, or with mention of more than one site, but not specified as generalized, multiple sites   . Basal cell carcinoma, arm 11/13    Dr Cheree Ditto    Past Surgical History  Procedure Laterality Date  . Dilation and curettage of uterus      rupture- peritonitis & hysterectomy 12/79  . Vaginal delivery      x2  . Breast biopsy      Left breast calcifications removed    Family History  Problem Relation Age of Onset  . Osteoporosis Mother   . Coronary artery disease Father   . Breast cancer Sister   . Cancer Maternal Grandfather     colon cancer  . Diabetes Neg Hx   . Hypertension Neg Hx     History   Social History   . Marital Status: Divorced    Spouse Name: N/A    Number of Children: 2  . Years of Education: N/A   Occupational History  . 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      Social History Main Topics  . Smoking status: Never Smoker   . Smokeless tobacco: Never Used  . Alcohol Use: Yes     Comment: occasional  . Drug Use: Not on file  . Sexual Activity: Not on file   Other Topics Concern  . Not on file   Social History Narrative   No living will   Would want daughter, [name], to make health care decisions for her   Would accept resuscitation attempts   Not sure about tube feedings   Review of Systems Weight up 2# though Sleeping well    Objective:   Physical Exam  Constitutional: She appears well-developed and well-nourished. No distress.  Neck: Normal range of motion. Neck supple. No thyromegaly present.  Cardiovascular: Normal rate, regular rhythm and normal heart sounds.  Exam reveals no gallop.   No murmur heard. Pulmonary/Chest:  Effort normal and breath sounds normal. No respiratory distress. She has no wheezes. She has no rales.  Abdominal: Soft. There is no tenderness.  Lymphadenopathy:    She has no cervical adenopathy.  Skin:  Small actinic on chest just to right of upper sternum  Psychiatric: She has a normal mood and affect. Her behavior is normal.          Assessment & Plan:

## 2013-04-17 NOTE — Assessment & Plan Note (Signed)
Getting treated for H pylori by GI then probably going on a PPI

## 2013-04-17 NOTE — Assessment & Plan Note (Signed)
BP Readings from Last 3 Encounters:  04/17/13 140/90  10/24/12 158/80  10/16/12 148/100   Better control Weight up slightly but doing better with lifestyle No Rx for now

## 2013-07-30 ENCOUNTER — Ambulatory Visit: Payer: Self-pay | Admitting: Internal Medicine

## 2013-07-31 ENCOUNTER — Encounter: Payer: Self-pay | Admitting: Internal Medicine

## 2013-08-01 ENCOUNTER — Encounter: Payer: Self-pay | Admitting: *Deleted

## 2013-10-28 ENCOUNTER — Encounter: Payer: Self-pay | Admitting: Internal Medicine

## 2013-10-28 ENCOUNTER — Ambulatory Visit (INDEPENDENT_AMBULATORY_CARE_PROVIDER_SITE_OTHER): Payer: Medicare Other | Admitting: Internal Medicine

## 2013-10-28 VITALS — BP 140/90 | HR 74 | Temp 98.3°F | Ht 68.0 in | Wt 231.0 lb

## 2013-10-28 DIAGNOSIS — K219 Gastro-esophageal reflux disease without esophagitis: Secondary | ICD-10-CM

## 2013-10-28 DIAGNOSIS — Z0000 Encounter for general adult medical examination without abnormal findings: Secondary | ICD-10-CM

## 2013-10-28 DIAGNOSIS — I10 Essential (primary) hypertension: Secondary | ICD-10-CM

## 2013-10-28 DIAGNOSIS — Z23 Encounter for immunization: Secondary | ICD-10-CM

## 2013-10-28 LAB — CBC WITH DIFFERENTIAL/PLATELET
BASOS PCT: 0.4 % (ref 0.0–3.0)
Basophils Absolute: 0 10*3/uL (ref 0.0–0.1)
Eosinophils Absolute: 0.1 10*3/uL (ref 0.0–0.7)
Eosinophils Relative: 1.4 % (ref 0.0–5.0)
HCT: 40.8 % (ref 36.0–46.0)
Hemoglobin: 13.8 g/dL (ref 12.0–15.0)
LYMPHS ABS: 2 10*3/uL (ref 0.7–4.0)
Lymphocytes Relative: 30.6 % (ref 12.0–46.0)
MCHC: 33.7 g/dL (ref 30.0–36.0)
MCV: 89.7 fl (ref 78.0–100.0)
Monocytes Absolute: 0.6 10*3/uL (ref 0.1–1.0)
Monocytes Relative: 9.1 % (ref 3.0–12.0)
Neutro Abs: 3.8 10*3/uL (ref 1.4–7.7)
Neutrophils Relative %: 58.5 % (ref 43.0–77.0)
PLATELETS: 252 10*3/uL (ref 150.0–400.0)
RBC: 4.55 Mil/uL (ref 3.87–5.11)
RDW: 13.6 % (ref 11.5–15.5)
WBC: 6.5 10*3/uL (ref 4.0–10.5)

## 2013-10-28 LAB — LIPID PANEL
CHOL/HDL RATIO: 4
Cholesterol: 197 mg/dL (ref 0–200)
HDL: 44.5 mg/dL (ref >39.00)
LDL CALC: 123 mg/dL — AB (ref 0–99)
Triglycerides: 147 mg/dL (ref 0.0–149.0)
VLDL: 29.4 mg/dL (ref 0.0–40.0)

## 2013-10-28 LAB — COMPREHENSIVE METABOLIC PANEL
ALK PHOS: 69 U/L (ref 39–117)
ALT: 11 U/L (ref 0–35)
AST: 18 U/L (ref 0–37)
Albumin: 4 g/dL (ref 3.5–5.2)
BUN: 15 mg/dL (ref 6–23)
CO2: 26 mEq/L (ref 19–32)
Calcium: 9.3 mg/dL (ref 8.4–10.5)
Chloride: 105 mEq/L (ref 96–112)
Creatinine, Ser: 0.8 mg/dL (ref 0.4–1.2)
GFR: 74.33 mL/min (ref >60.00)
GLUCOSE: 90 mg/dL (ref 70–99)
Potassium: 4 mEq/L (ref 3.5–5.1)
SODIUM: 139 meq/L (ref 135–145)
TOTAL PROTEIN: 7.7 g/dL (ref 6.0–8.3)
Total Bilirubin: 0.8 mg/dL (ref 0.2–1.2)

## 2013-10-28 LAB — TSH: TSH: 1.36 u[IU]/mL (ref 0.35–4.50)

## 2013-10-28 LAB — T4, FREE: FREE T4: 0.81 ng/dL (ref 0.60–1.60)

## 2013-10-28 NOTE — Progress Notes (Signed)
Subjective:    Patient ID: Veronica Stanley, female    DOB: 01-29-1946, 68 y.o.   MRN: [id number]  HPI Here for physical and Medicare wellness Reviewed form Reviewed advanced directives Has really picked up exercise---walking regularly No tobacco or alcohol Independent with instrumental ADLs No sig cognitive changes Reviewed other doctors she sees Hearing and vision are okay No falls No depression or anhedonia--- actually feeling great  Gained weight over the holidays Now being more careful with exercise Discussed healthy eating  No chest pain No SOB No dizziness or syncope  Stomach fine on the omeprazole No follow up for now with Dr Vira Agar  Current Outpatient Prescriptions on File Prior to Visit  Medication Sig Dispense Refill  . Cholecalciferol (VITAMIN D) 1000 UNITS capsule Take 1,000 Units by mouth daily.        . fish oil-omega-3 fatty acids 1000 MG capsule Take 2 g by mouth daily.        Marland Kitchen ibuprofen (ADVIL,MOTRIN) 200 MG tablet Take 200 mg by mouth as needed for pain.       No current facility-administered medications on file prior to visit.    Allergies  Allergen Reactions  . Ibandronate Sodium     REACTION: aches  . Penicillins     Past Medical History  Diagnosis Date  . Diverticulitis     colon  . Hypertension   . Osteoporosis   . GERD (gastroesophageal reflux disease)   . Menopausal symptoms   . Osteoarthrosis involving, or with mention of more than one site, but not specified as generalized, multiple sites   . Basal cell carcinoma, arm 11/13    Dr Phillip Heal    Past Surgical History  Procedure Laterality Date  . Dilation and curettage of uterus      rupture- peritonitis & hysterectomy 12/79  . Vaginal delivery      x2  . Breast biopsy      Left breast calcifications removed    Family History  Problem Relation Age of Onset  . Osteoporosis Mother   . Coronary artery disease Father   . Breast cancer Sister   . Cancer Maternal Grandfather    colon cancer  . Diabetes Neg Hx   . Hypertension Neg Hx     History   Social History  . Marital Status: Divorced    Spouse Name: N/A    Number of Children: 2  . Years of Education: N/A   Occupational History  . 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      Social History Main Topics  . Smoking status: Never Smoker   . Smokeless tobacco: Never Used  . Alcohol Use: Yes     Comment: occasional  . Drug Use: Not on file  . Sexual Activity: Not on file   Other Topics Concern  . Not on file   Social History Narrative   No living will   Would want daughter, Margarita Grizzle, to make health care decisions for her   Would accept resuscitation attempts   Not sure about tube feedings   Review of Systems  Constitutional: Negative for fatigue.       Wears seat belt  HENT: Positive for tinnitus. Negative for hearing loss.        Very rare tinnitus Regular with dentist  Eyes: Negative for visual disturbance.       No diplopia or unilateral vision loss  Respiratory: Negative for cough, chest tightness and shortness of breath.  Cardiovascular: Negative for chest pain, palpitations and leg swelling.  Gastrointestinal: Positive for anal bleeding. Negative for nausea, vomiting, abdominal pain, constipation and blood in stool.       Heartburn controlled occ trouble with hemorrhoid bleeding  Endocrine: Negative for cold intolerance and heat intolerance.  Genitourinary: Negative for dysuria, hematuria and difficulty urinating.       No sex-no problem  Musculoskeletal: Negative for arthralgias, back pain and joint swelling.       Left hip better with more walking  Skin: Negative for rash.       Has derm eval coming up  Allergic/Immunologic: Negative for environmental allergies and immunocompromised state.  Neurological: Negative for dizziness, syncope, weakness, light-headedness, numbness and headaches.  Hematological: Negative for adenopathy. Bruises/bleeds easily.    Psychiatric/Behavioral: Negative for sleep disturbance and dysphoric mood. The patient is not nervous/anxious.        Objective:   Physical Exam  Constitutional: She is oriented to person, place, and time. She appears well-developed and well-nourished. No distress.  HENT:  Head: Normocephalic and atraumatic.  Right Ear: External ear normal.  Left Ear: External ear normal.  Mouth/Throat: Oropharynx is clear and moist. No oropharyngeal exudate.  Eyes: Conjunctivae and EOM are normal. Pupils are equal, round, and reactive to light.  Neck: Normal range of motion. Neck supple. No thyromegaly present.  Cardiovascular: Normal rate, regular rhythm, normal heart sounds and intact distal pulses.  Exam reveals no gallop.   No murmur heard. Pulmonary/Chest: Effort normal and breath sounds normal. No respiratory distress. She has no wheezes. She has no rales.  Abdominal: Soft. There is no tenderness.  Genitourinary:  Mild cystic changes without worrisome mass in breasts  Musculoskeletal: She exhibits no edema and no tenderness.  Lymphadenopathy:    She has no cervical adenopathy.    She has no axillary adenopathy.  Neurological: She is alert and oriented to person, place, and time.  Fayrene Fearing, [place]" [phone] [id number]  Recall 3/3  Skin: No rash noted. No erythema.  Scaly spot on right forearm (seeing derm soon)  Psychiatric: She has a normal mood and affect. Her behavior is normal.          Assessment & Plan:

## 2013-10-28 NOTE — Assessment & Plan Note (Signed)
Quiet on the PPI Needs to continue

## 2013-10-28 NOTE — Patient Instructions (Signed)
DASH Diet  The DASH diet stands for "Dietary Approaches to Stop Hypertension." It is a healthy eating plan that has been shown to reduce high blood pressure (hypertension) in as little as 14 days, while also possibly providing other significant health benefits. These other health benefits include reducing the risk of breast cancer after menopause and reducing the risk of type 2 diabetes, heart disease, colon cancer, and stroke. Health benefits also include weight loss and slowing kidney failure in patients with chronic kidney disease.   DIET GUIDELINES  · Limit salt (sodium). Your diet should contain less than 1500 mg of sodium daily.  · Limit refined or processed carbohydrates. Your diet should include mostly whole grains. Desserts and added sugars should be used sparingly.  · Include small amounts of heart-healthy fats. These types of fats include nuts, oils, and tub margarine. Limit saturated and trans fats. These fats have been shown to be harmful in the body.  CHOOSING FOODS   The following food groups are based on a 2000 calorie diet. See your Registered Dietitian for individual calorie needs.  Grains and Grain Products (6 to 8 servings daily)  · Eat More Often: Whole-wheat bread, brown rice, whole-grain or wheat pasta, quinoa, popcorn without added fat or salt (air popped).  · Eat Less Often: White bread, white pasta, white rice, cornbread.  Vegetables (4 to 5 servings daily)  · Eat More Often: Fresh, frozen, and canned vegetables. Vegetables may be raw, steamed, roasted, or grilled with a minimal amount of fat.  · Eat Less Often/Avoid: Creamed or fried vegetables. Vegetables in a cheese sauce.  Fruit (4 to 5 servings daily)  · Eat More Often: All fresh, canned (in natural juice), or frozen fruits. Dried fruits without added sugar. One hundred percent fruit juice (½ cup [237 mL] daily).  · Eat Less Often: Dried fruits with added sugar. Canned fruit in light or heavy syrup.  Lean Meats, Fish, and Poultry (2  servings or less daily. One serving is 3 to 4 oz [85-114 g]).  · Eat More Often: Ninety percent or leaner ground beef, tenderloin, sirloin. Round cuts of beef, chicken breast, turkey breast. All fish. Grill, bake, or broil your meat. Nothing should be fried.  · Eat Less Often/Avoid: Fatty cuts of meat, turkey, or chicken leg, thigh, or wing. Fried cuts of meat or fish.  Dairy (2 to 3 servings)  · Eat More Often: Low-fat or fat-free milk, low-fat plain or light yogurt, reduced-fat or part-skim cheese.  · Eat Less Often/Avoid: Milk (whole, 2%). Whole milk yogurt. Full-fat cheeses.  Nuts, Seeds, and Legumes (4 to 5 servings per week)  · Eat More Often: All without added salt.  · Eat Less Often/Avoid: Salted nuts and seeds, canned beans with added salt.  Fats and Sweets (limited)  · Eat More Often: Vegetable oils, tub margarines without trans fats, sugar-free gelatin. Mayonnaise and salad dressings.  · Eat Less Often/Avoid: Coconut oils, palm oils, butter, stick margarine, cream, half and half, cookies, candy, pie.  FOR MORE INFORMATION  The Dash Diet Eating Plan: www.dashdiet.org  Document Released: 05/25/2011 Document Revised: 08/28/2011 Document Reviewed: 05/25/2011  ExitCare® Patient Information ©2014 [employer].

## 2013-10-28 NOTE — Assessment & Plan Note (Signed)
BP Readings from Last 3 Encounters:  10/28/13 140/90  04/17/13 140/90  10/24/12 158/80   Acceptable with just lifestyle measures

## 2013-10-28 NOTE — Assessment & Plan Note (Signed)
I have personally reviewed the Medicare Annual Wellness questionnaire and have noted 1. The patient's medical and social history 2. Their use of alcohol, tobacco or illicit drugs 3. Their current medications and supplements 4. The patient's functional ability including ADL's, fall risks, home safety risks and hearing or visual             impairment. 5. Diet and physical activities 6. Evidence for depression or mood disorders  The patients weight, height, BMI and visual acuity have been recorded in the chart I have made referrals, counseling and provided education to the patient based review of the above and I have provided the pt with a written personalized care plan for preventive services.  I have provided you with a copy of your personalized plan for preventive services. Please take the time to review along with your updated medication list.  Doing well Discussed lifestyle Every other year mammograms till at least 33 Will probably get 1 more colonoscopy in 8-9 years prevnar today Yearly flu shots

## 2013-10-28 NOTE — Progress Notes (Signed)
Pre visit review using our clinic review tool, if applicable. No additional management support is needed unless otherwise documented below in the visit note. 

## 2013-10-28 NOTE — Addendum Note (Signed)
Addended by: Despina Hidden on: 10/28/2013 10:52 AM   Modules accepted: Orders

## 2013-10-29 ENCOUNTER — Encounter: Payer: Self-pay | Admitting: *Deleted

## 2013-10-29 ENCOUNTER — Telehealth: Payer: Self-pay | Admitting: Internal Medicine

## 2013-10-29 NOTE — Telephone Encounter (Signed)
Relevant patient education mailed to patient.  

## 2013-11-18 ENCOUNTER — Other Ambulatory Visit: Payer: Self-pay | Admitting: *Deleted

## 2013-11-18 MED ORDER — OMEPRAZOLE 20 MG PO CPDR: 20.0000 mg | DELAYED_RELEASE_CAPSULE | Freq: Every day | ORAL | Status: DC

## 2014-03-26 ENCOUNTER — Telehealth: Payer: Self-pay | Admitting: Internal Medicine

## 2014-03-26 NOTE — Telephone Encounter (Signed)
Please review pervious message below from [employer] and advise. Thank you

## 2014-03-26 NOTE — Telephone Encounter (Signed)
Pt called in to request a screening for aortic aneurysm to be done with Dr. Dorothyann Gibbs @ [facility] [address], [place] [address] [phone] to r/o aneurysm bc sister has 2 aneurysms and one is 7.5 and is worried that she may be prone to this as well. Father had this in the past as well and she received a letter from the [facility] office that stated that this can be hereditary and that she needed to be screened for this and that she needed her PCP referral.

## 2014-03-26 NOTE — Telephone Encounter (Signed)
Please let her know that Medicare will likely not cover this for just having a family history (screening for aortic aneurysm is only covered in men over 65 who have been long time smokers). I would be happy to refer her if she likes--but no reason to go to [facility] be done just as well at our clinic in [place]

## 2014-03-27 NOTE — Telephone Encounter (Signed)
Spoke with patient to notify her of Dr. Alla German comments. Patient verbalized understanding. She stated that she is willing to pay out of pocket for testing and would like to schedule an appointment with Dr. Silvio Pate. Patient stated she will call front office to schedule appointment soon.

## 2014-04-08 ENCOUNTER — Encounter: Payer: Self-pay | Admitting: Internal Medicine

## 2014-04-08 ENCOUNTER — Ambulatory Visit (INDEPENDENT_AMBULATORY_CARE_PROVIDER_SITE_OTHER): Payer: Medicare Other | Admitting: Internal Medicine

## 2014-04-08 VITALS — BP 150/90 | HR 83 | Temp 98.2°F | Wt 238.0 lb

## 2014-04-08 DIAGNOSIS — I10 Essential (primary) hypertension: Secondary | ICD-10-CM

## 2014-04-08 DIAGNOSIS — Z8249 Family history of ischemic heart disease and other diseases of the circulatory system: Secondary | ICD-10-CM

## 2014-04-08 NOTE — Assessment & Plan Note (Signed)
BP Readings from Last 3 Encounters:  04/08/14 150/90  10/28/13 140/90  04/17/13 140/90   Reasonable control Checking a lot at home---told her she can cut back Worried about AAA due to FH---will check ultrasound

## 2014-04-08 NOTE — Progress Notes (Signed)
Pre visit review using our clinic review tool, if applicable. No additional management support is needed unless otherwise documented below in the visit note. 

## 2014-04-08 NOTE — Progress Notes (Signed)
   Subjective:    Patient ID: Veronica Stanley, female    DOB: 05-Feb-1946, 68 y.o.   MRN: [id number]  HPI Concerned about FH Dad had thoracic aortic aneurysm that was never treated Sister has large abdominal aortic aneurysm and is undergoing surgery and small in thoracic.  No abdominal symptoms Never smoker---except rare single cigarettes  Got new machine since sister's diagnosis 137/84- 172/89 Most under 150/88  Current Outpatient Prescriptions on File Prior to Visit  Medication Sig Dispense Refill  . Cholecalciferol (VITAMIN D) 1000 UNITS capsule Take 1,000 Units by mouth daily.        . fish oil-omega-3 fatty acids 1000 MG capsule Take 2 g by mouth daily.        Marland Kitchen ibuprofen (ADVIL,MOTRIN) 200 MG tablet Take 200 mg by mouth as needed for pain.      Marland Kitchen omeprazole (PRILOSEC) 20 MG capsule Take 1 capsule (20 mg total) by mouth daily.  30 capsule  5   No current facility-administered medications on file prior to visit.    Allergies  Allergen Reactions  . Ibandronate Sodium     REACTION: aches  . Penicillins     Past Medical History  Diagnosis Date  . Diverticulitis     colon  . Hypertension   . Osteoporosis   . GERD (gastroesophageal reflux disease)   . Menopausal symptoms   . Osteoarthrosis involving, or with mention of more than one site, but not specified as generalized, multiple sites   . Basal cell carcinoma, arm 11/13    Dr Phillip Heal    Past Surgical History  Procedure Laterality Date  . Dilation and curettage of uterus      rupture- peritonitis & hysterectomy 12/79  . Vaginal delivery      x2  . Breast biopsy      Left breast calcifications removed    Family History  Problem Relation Age of Onset  . Osteoporosis Mother   . Coronary artery disease Father   . Breast cancer Sister   . Cancer Maternal Grandfather     colon cancer  . Diabetes Neg Hx   . Hypertension Neg Hx     History   Social History  . Marital Status: Divorced    Spouse Name: N/A   Number of Children: 2  . Years of Education: N/A   Occupational History  . 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      Social History Main Topics  . Smoking status: Never Smoker   . Smokeless tobacco: Never Used  . Alcohol Use: Yes     Comment: occasional  . Drug Use: Not on file  . Sexual Activity: Not on file   Other Topics Concern  . Not on file   Social History Narrative   No living will   Would want daughter, Margarita Grizzle, to make health care decisions for her   Would accept resuscitation attempts   Not sure about tube feedings   Review of Systems     Objective:   Physical Exam  Constitutional: She appears well-developed and well-nourished. No distress.  Abdominal:  No palpable aorta          Assessment & Plan:

## 2014-04-09 ENCOUNTER — Telehealth: Payer: Self-pay | Admitting: Internal Medicine

## 2014-04-09 NOTE — Telephone Encounter (Signed)
emmi emailed °

## 2014-04-23 ENCOUNTER — Encounter (INDEPENDENT_AMBULATORY_CARE_PROVIDER_SITE_OTHER): Payer: Medicare Other

## 2014-04-23 DIAGNOSIS — Z8249 Family history of ischemic heart disease and other diseases of the circulatory system: Secondary | ICD-10-CM

## 2014-04-23 DIAGNOSIS — I10 Essential (primary) hypertension: Secondary | ICD-10-CM

## 2014-09-03 ENCOUNTER — Ambulatory Visit: Payer: Self-pay | Admitting: Internal Medicine

## 2014-09-04 ENCOUNTER — Encounter: Payer: Self-pay | Admitting: Internal Medicine

## 2014-11-03 ENCOUNTER — Encounter: Payer: Self-pay | Admitting: Internal Medicine

## 2014-11-03 ENCOUNTER — Ambulatory Visit (INDEPENDENT_AMBULATORY_CARE_PROVIDER_SITE_OTHER): Payer: Medicare Other | Admitting: Internal Medicine

## 2014-11-03 VITALS — BP 150/90 | HR 77 | Temp 97.6°F | Ht 68.0 in | Wt 239.0 lb

## 2014-11-03 DIAGNOSIS — I10 Essential (primary) hypertension: Secondary | ICD-10-CM

## 2014-11-03 DIAGNOSIS — Z0000 Encounter for general adult medical examination without abnormal findings: Secondary | ICD-10-CM

## 2014-11-03 DIAGNOSIS — M150 Primary generalized (osteo)arthritis: Secondary | ICD-10-CM | POA: Diagnosis not present

## 2014-11-03 DIAGNOSIS — K219 Gastro-esophageal reflux disease without esophagitis: Secondary | ICD-10-CM

## 2014-11-03 DIAGNOSIS — Z7189 Other specified counseling: Secondary | ICD-10-CM

## 2014-11-03 DIAGNOSIS — M159 Polyosteoarthritis, unspecified: Secondary | ICD-10-CM

## 2014-11-03 LAB — CBC WITH DIFFERENTIAL/PLATELET
BASOS ABS: 0 10*3/uL (ref 0.0–0.1)
Basophils Relative: 0.5 % (ref 0.0–3.0)
Eosinophils Absolute: 0.2 10*3/uL (ref 0.0–0.7)
Eosinophils Relative: 2.7 % (ref 0.0–5.0)
HCT: 39.6 % (ref 36.0–46.0)
Hemoglobin: 13.4 g/dL (ref 12.0–15.0)
LYMPHS ABS: 2 10*3/uL (ref 0.7–4.0)
LYMPHS PCT: 30.3 % (ref 12.0–46.0)
MCHC: 33.7 g/dL (ref 30.0–36.0)
MCV: 88.7 fl (ref 78.0–100.0)
Monocytes Absolute: 0.6 10*3/uL (ref 0.1–1.0)
Monocytes Relative: 8.7 % (ref 3.0–12.0)
Neutro Abs: 3.8 10*3/uL (ref 1.4–7.7)
Neutrophils Relative %: 57.8 % (ref 43.0–77.0)
PLATELETS: 252 10*3/uL (ref 150.0–400.0)
RBC: 4.47 Mil/uL (ref 3.87–5.11)
RDW: 14.2 % (ref 11.5–15.5)
WBC: 6.6 10*3/uL (ref 4.0–10.5)

## 2014-11-03 LAB — COMPREHENSIVE METABOLIC PANEL
ALT: 12 U/L (ref 0–35)
AST: 19 U/L (ref 0–37)
Albumin: 4 g/dL (ref 3.5–5.2)
Alkaline Phosphatase: 76 U/L (ref 39–117)
BUN: 18 mg/dL (ref 6–23)
CALCIUM: 9.3 mg/dL (ref 8.4–10.5)
CO2: 31 meq/L (ref 19–32)
Chloride: 104 mEq/L (ref 96–112)
Creatinine, Ser: 0.7 mg/dL (ref 0.40–1.20)
GFR: 87.71 mL/min (ref >60.00)
Glucose, Bld: 105 mg/dL — ABNORMAL HIGH (ref 70–99)
Potassium: 4.5 mEq/L (ref 3.5–5.1)
SODIUM: 138 meq/L (ref 135–145)
Total Bilirubin: 0.4 mg/dL (ref 0.2–1.2)
Total Protein: 7.4 g/dL (ref 6.0–8.3)

## 2014-11-03 LAB — T4, FREE: FREE T4: 0.68 ng/dL (ref 0.60–1.60)

## 2014-11-03 NOTE — Assessment & Plan Note (Signed)
I have personally reviewed the Medicare Annual Wellness questionnaire and have noted 1. The patient's medical and social history 2. Their use of alcohol, tobacco or illicit drugs 3. Their current medications and supplements 4. The patient's functional ability including ADL's, fall risks, home safety risks and hearing or visual             impairment. 5. Diet and physical activities 6. Evidence for depression or mood disorders  The patients weight, height, BMI and visual acuity have been recorded in the chart I have made referrals, counseling and provided education to the patient based review of the above and I have provided the pt with a written personalized care plan for preventive services.  I have provided you with a copy of your personalized plan for preventive services. Please take the time to review along with your updated medication list.  UTD on immunizations Colonoscopy not due for 8 years UTD on mammo--discussed breast exam and deferred No Pap due to age Discussed fitness

## 2014-11-03 NOTE — Progress Notes (Signed)
Pre visit review using our clinic review tool, if applicable. No additional management support is needed unless otherwise documented below in the visit note. 

## 2014-11-03 NOTE — Progress Notes (Signed)
Subjective:    Patient ID: Veronica Stanley, female    DOB: 12-14-1945, 69 y.o.   MRN: [id number]  HPI Here for Medicare wellness and follow up of chronic medical conditions Reviewed form and advanced directives Reviewed other physicians Occasional glass of wine No tobacco Starting to do more regular exercise Independent with instrumental ADLs Vision and hearing are fine No falls No depression or anhedonia No apparent cognitive problems---will forget names (only)  Reviewed her BP Her BP machine correlates well with ours Does get some readings over 170 at times--but may have been on incorrectly Knows she gets nervous here No stress since retiring--but may help out part time at [employer] Has started with moderate exercise program No chest pain No palpitations No SOB---stable DOE if goes up a lot of steps No edema No dizziness or syncope  Some concern about her use of omeprazole Uses daily and sometimes tries to skip Stopped about a month ago No acid symptoms or dysphagia Will have symptoms if she has chocolate at night  Has had some left hip pain at times Usually if she overdoes it with house work--etc' Aleve does help  Current Outpatient Prescriptions on File Prior to Visit  Medication Sig Dispense Refill  . Cholecalciferol (VITAMIN D) 1000 UNITS capsule Take 1,000 Units by mouth daily.      . fish oil-omega-3 fatty acids 1000 MG capsule Take 2 g by mouth daily.      Marland Kitchen omeprazole (PRILOSEC) 20 MG capsule Take 1 capsule (20 mg total) by mouth daily. 30 capsule 5   No current facility-administered medications on file prior to visit.    Allergies  Allergen Reactions  . Ibandronate Sodium     REACTION: aches  . Penicillins     Past Medical History  Diagnosis Date  . Diverticulitis     colon  . Hypertension   . Osteoporosis   . GERD (gastroesophageal reflux disease)   . Menopausal symptoms   . Osteoarthrosis involving, or with mention of more than one site,  but not specified as generalized, multiple sites   . Basal cell carcinoma, arm 11/13    Dr Phillip Heal    Past Surgical History  Procedure Laterality Date  . Dilation and curettage of uterus      rupture- peritonitis & hysterectomy 12/79  . Vaginal delivery      x2  . Breast biopsy      Left breast calcifications removed    Family History  Problem Relation Age of Onset  . Osteoporosis Mother   . Coronary artery disease Father   . Breast cancer Sister   . Cancer Maternal Grandfather     colon cancer  . Diabetes Neg Hx   . Hypertension Neg Hx     History   Social History  . Marital Status: Divorced    Spouse Name: N/A  . Number of Children: 2  . Years of Education: N/A   Occupational History  . 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      Social History Main Topics  . Smoking status: Never Smoker   . Smokeless tobacco: Never Used  . Alcohol Use: Yes     Comment: occasional  . Drug Use: Not on file  . Sexual Activity: Not on file   Other Topics Concern  . Not on file   Social History Narrative   No living will   Would want daughter, Veronica Stanley, to make health care decisions  for her   Would accept resuscitation attempts   Not sure about tube feedings   Review of Systems Appetite is fine Weight is about the same--but at her personal highest. Sleeps okay Bowels fine No urinary problems Has loose tooth--keeps up with dentist Wears seat belt    Objective:   Physical Exam  Constitutional: She is oriented to person, place, and time. She appears well-developed and well-nourished. No distress.  HENT:  Mouth/Throat: Oropharynx is clear and moist. No oropharyngeal exudate.  Neck: Normal range of motion. Neck supple. No thyromegaly present.  Cardiovascular: Normal rate, regular rhythm, normal heart sounds and intact distal pulses.  Exam reveals no gallop.   No murmur heard. Pulmonary/Chest: Effort normal and breath sounds normal. No  respiratory distress. She has no wheezes. She has no rales.  Abdominal: Soft. There is no tenderness.  Musculoskeletal: She exhibits no edema or tenderness.  Lymphadenopathy:    She has no cervical adenopathy.  Neurological: She is alert and oriented to person, place, and time.  President-- "Obama, Bush, daddy Bush--then CLinton" [phone] D-l-r-o-w Recall 3/3  Skin: No rash noted. No erythema.  Psychiatric: She has a normal mood and affect. Her behavior is normal.          Assessment & Plan:

## 2014-11-03 NOTE — Assessment & Plan Note (Signed)
BP Readings from Last 3 Encounters:  11/03/14 150/90  04/08/14 150/90  10/28/13 140/90   Borderline Discussed meds vs improved lifestyle Will give her the chance to work on weight loss, etc

## 2014-11-03 NOTE — Assessment & Plan Note (Signed)
Mostly left hip Aleve really helps

## 2014-11-03 NOTE — Patient Instructions (Signed)
DASH Eating Plan °DASH stands for "Dietary Approaches to Stop Hypertension." The DASH eating plan is a healthy eating plan that has been shown to reduce high blood pressure (hypertension). Additional health benefits may include reducing the risk of type 2 diabetes mellitus, heart disease, and stroke. The DASH eating plan may also help with weight loss. °WHAT DO I NEED TO KNOW ABOUT THE DASH EATING PLAN? °For the DASH eating plan, you will follow these general guidelines: °· Choose foods with a percent daily value for sodium of less than 5% (as listed on the food label). °· Use salt-free seasonings or herbs instead of table salt or sea salt. °· Check with your health care provider or pharmacist before using salt substitutes. °· Eat lower-sodium products, often labeled as "lower sodium" or "no salt added." °· Eat fresh foods. °· Eat more vegetables, fruits, and low-fat dairy products. °· Choose whole grains. Look for the word "whole" as the first word in the ingredient list. °· Choose fish and skinless chicken or turkey more often than red meat. Limit fish, poultry, and meat to 6 oz (170 g) each day. °· Limit sweets, desserts, sugars, and sugary drinks. °· Choose heart-healthy fats. °· Limit cheese to 1 oz (28 g) per day. °· Eat more home-cooked food and less restaurant, buffet, and fast food. °· Limit fried foods. °· Cook foods using methods other than frying. °· Limit canned vegetables. If you do use them, rinse them well to decrease the sodium. °· When eating at a restaurant, ask that your food be prepared with less salt, or no salt if possible. °WHAT FOODS CAN I EAT? °Seek help from a dietitian for individual calorie needs. °Grains °Whole grain or whole wheat bread. Brown rice. Whole grain or whole wheat pasta. Quinoa, bulgur, and whole grain cereals. Low-sodium cereals. Corn or whole wheat flour tortillas. Whole grain cornbread. Whole grain crackers. Low-sodium crackers. °Vegetables °Fresh or frozen vegetables  (raw, steamed, roasted, or grilled). Low-sodium or reduced-sodium tomato and vegetable juices. Low-sodium or reduced-sodium tomato sauce and paste. Low-sodium or reduced-sodium canned vegetables.  °Fruits °All fresh, canned (in natural juice), or frozen fruits. °Meat and Other Protein Products °Ground beef (85% or leaner), grass-fed beef, or beef trimmed of fat. Skinless chicken or turkey. Ground chicken or turkey. Pork trimmed of fat. All fish and seafood. Eggs. Dried beans, peas, or lentils. Unsalted nuts and seeds. Unsalted canned beans. °Dairy °Low-fat dairy products, such as skim or 1% milk, 2% or reduced-fat cheeses, low-fat ricotta or cottage cheese, or plain low-fat yogurt. Low-sodium or reduced-sodium cheeses. °Fats and Oils °Tub margarines without trans fats. Light or reduced-fat mayonnaise and salad dressings (reduced sodium). Avocado. Safflower, olive, or canola oils. Natural peanut or almond butter. °Other °Unsalted popcorn and pretzels. °The items listed above may not be a complete list of recommended foods or beverages. Contact your dietitian for more options. °WHAT FOODS ARE NOT RECOMMENDED? °Grains °White bread. White pasta. White rice. Refined cornbread. Bagels and croissants. Crackers that contain trans fat. °Vegetables °Creamed or fried vegetables. Vegetables in a cheese sauce. Regular canned vegetables. Regular canned tomato sauce and paste. Regular tomato and vegetable juices. °Fruits °Dried fruits. Canned fruit in light or heavy syrup. Fruit juice. °Meat and Other Protein Products °Fatty cuts of meat. Ribs, chicken wings, bacon, sausage, bologna, salami, chitterlings, fatback, hot dogs, bratwurst, and packaged luncheon meats. Salted nuts and seeds. Canned beans with salt. °Dairy °Whole or 2% milk, cream, half-and-half, and cream cheese. Whole-fat or sweetened yogurt. Full-fat   cheeses or blue cheese. Nondairy creamers and whipped toppings. Processed cheese, cheese spreads, or cheese  curds. °Condiments °Onion and garlic salt, seasoned salt, table salt, and sea salt. Canned and packaged gravies. Worcestershire sauce. Tartar sauce. Barbecue sauce. Teriyaki sauce. Soy sauce, including reduced sodium. Steak sauce. Fish sauce. Oyster sauce. Cocktail sauce. Horseradish. Ketchup and mustard. Meat flavorings and tenderizers. Bouillon cubes. Hot sauce. Tabasco sauce. Marinades. Taco seasonings. Relishes. °Fats and Oils °Butter, stick margarine, lard, shortening, ghee, and bacon fat. Coconut, palm kernel, or palm oils. Regular salad dressings. °Other °Pickles and olives. Salted popcorn and pretzels. °The items listed above may not be a complete list of foods and beverages to avoid. Contact your dietitian for more information. °WHERE CAN I FIND MORE INFORMATION? °National Heart, Lung, and Blood Institute: www.nhlbi.nih.gov/health/health-topics/topics/dash/ °Document Released: 05/25/2011 Document Revised: 10/20/2013 Document Reviewed: 04/09/2013 °ExitCare® Patient Information ©2015 Exit[employer]. This information is not intended to replace advice given to you by your health care provider. Make sure you discuss any questions you have with your health care provider. ° °

## 2014-11-03 NOTE — Assessment & Plan Note (Signed)
Using the omeprazole only prn now

## 2014-11-03 NOTE — Assessment & Plan Note (Signed)
Asked her to do the forms I gave her See social history

## 2015-11-10 ENCOUNTER — Ambulatory Visit (INDEPENDENT_AMBULATORY_CARE_PROVIDER_SITE_OTHER): Payer: Medicare Other | Admitting: Internal Medicine

## 2015-11-10 ENCOUNTER — Encounter: Payer: Self-pay | Admitting: Internal Medicine

## 2015-11-10 VITALS — BP 140/74 | HR 74 | Temp 98.4°F | Ht 67.25 in | Wt 237.0 lb

## 2015-11-10 DIAGNOSIS — Z0000 Encounter for general adult medical examination without abnormal findings: Secondary | ICD-10-CM

## 2015-11-10 DIAGNOSIS — R0609 Other forms of dyspnea: Secondary | ICD-10-CM

## 2015-11-10 DIAGNOSIS — K219 Gastro-esophageal reflux disease without esophagitis: Secondary | ICD-10-CM

## 2015-11-10 DIAGNOSIS — Z1159 Encounter for screening for other viral diseases: Secondary | ICD-10-CM

## 2015-11-10 DIAGNOSIS — I10 Essential (primary) hypertension: Secondary | ICD-10-CM

## 2015-11-10 DIAGNOSIS — M160 Bilateral primary osteoarthritis of hip: Secondary | ICD-10-CM

## 2015-11-10 DIAGNOSIS — Z7189 Other specified counseling: Secondary | ICD-10-CM

## 2015-11-10 LAB — COMPREHENSIVE METABOLIC PANEL
ALT: 10 U/L (ref 0–35)
AST: 15 U/L (ref 0–37)
Albumin: 4.2 g/dL (ref 3.5–5.2)
Alkaline Phosphatase: 71 U/L (ref 39–117)
BILIRUBIN TOTAL: 0.4 mg/dL (ref 0.2–1.2)
BUN: 16 mg/dL (ref 6–23)
CALCIUM: 9.8 mg/dL (ref 8.4–10.5)
CO2: 31 meq/L (ref 19–32)
Chloride: 104 mEq/L (ref 96–112)
Creatinine, Ser: 0.65 mg/dL (ref 0.40–1.20)
GFR: 95.26 mL/min (ref >60.00)
Glucose, Bld: 86 mg/dL (ref 70–99)
Potassium: 4.4 mEq/L (ref 3.5–5.1)
Sodium: 141 mEq/L (ref 135–145)
Total Protein: 7.6 g/dL (ref 6.0–8.3)

## 2015-11-10 LAB — CBC WITH DIFFERENTIAL/PLATELET
BASOS ABS: 0 10*3/uL (ref 0.0–0.1)
BASOS PCT: 0.3 % (ref 0.0–3.0)
EOS PCT: 1.9 % (ref 0.0–5.0)
Eosinophils Absolute: 0.1 10*3/uL (ref 0.0–0.7)
HEMATOCRIT: 40.5 % (ref 36.0–46.0)
Hemoglobin: 13.3 g/dL (ref 12.0–15.0)
LYMPHS PCT: 27.3 % (ref 12.0–46.0)
Lymphs Abs: 2 10*3/uL (ref 0.7–4.0)
MCHC: 32.8 g/dL (ref 30.0–36.0)
MCV: 89.4 fl (ref 78.0–100.0)
MONOS PCT: 8.1 % (ref 3.0–12.0)
Monocytes Absolute: 0.6 10*3/uL (ref 0.1–1.0)
NEUTROS ABS: 4.7 10*3/uL (ref 1.4–7.7)
Neutrophils Relative %: 62.4 % (ref 43.0–77.0)
PLATELETS: 307 10*3/uL (ref 150.0–400.0)
RBC: 4.53 Mil/uL (ref 3.87–5.11)
RDW: 13.8 % (ref 11.5–15.5)
WBC: 7.5 10*3/uL (ref 4.0–10.5)

## 2015-11-10 LAB — LIPID PANEL
Cholesterol: 191 mg/dL (ref 0–200)
HDL: 46.3 mg/dL (ref >39.00)
LDL Cholesterol: 123 mg/dL — ABNORMAL HIGH (ref 0–99)
NonHDL: 144.29
TRIGLYCERIDES: 108 mg/dL (ref 0.0–149.0)
Total CHOL/HDL Ratio: 4
VLDL: 21.6 mg/dL (ref 0.0–40.0)

## 2015-11-10 LAB — T4, FREE: Free T4: 0.76 ng/dL (ref 0.60–1.60)

## 2015-11-10 NOTE — Patient Instructions (Signed)
DASH Eating Plan  DASH stands for "Dietary Approaches to Stop Hypertension." The DASH eating plan is a healthy eating plan that has been shown to reduce high blood pressure (hypertension). Additional health benefits may include reducing the risk of type 2 diabetes mellitus, heart disease, and stroke. The DASH eating plan may also help with weight loss.  WHAT DO I NEED TO KNOW ABOUT THE DASH EATING PLAN?  For the DASH eating plan, you will follow these general guidelines:  · Choose foods with a percent daily value for sodium of less than 5% (as listed on the food label).  · Use salt-free seasonings or herbs instead of table salt or sea salt.  · Check with your health care provider or pharmacist before using salt substitutes.  · Eat lower-sodium products, often labeled as "lower sodium" or "no salt added."  · Eat fresh foods.  · Eat more vegetables, fruits, and low-fat dairy products.  · Choose whole grains. Look for the word "whole" as the first word in the ingredient list.  · Choose fish and skinless chicken or turkey more often than red meat. Limit fish, poultry, and meat to 6 oz (170 g) each day.  · Limit sweets, desserts, sugars, and sugary drinks.  · Choose heart-healthy fats.  · Limit cheese to 1 oz (28 g) per day.  · Eat more home-cooked food and less restaurant, buffet, and fast food.  · Limit fried foods.  · Cook foods using methods other than frying.  · Limit canned vegetables. If you do use them, rinse them well to decrease the sodium.  · When eating at a restaurant, ask that your food be prepared with less salt, or no salt if possible.  WHAT FOODS CAN I EAT?  Seek help from a dietitian for individual calorie needs.  Grains  Whole grain or whole wheat bread. Brown rice. Whole grain or whole wheat pasta. Quinoa, bulgur, and whole grain cereals. Low-sodium cereals. Corn or whole wheat flour tortillas. Whole grain cornbread. Whole grain crackers. Low-sodium crackers.  Vegetables  Fresh or frozen vegetables  (raw, steamed, roasted, or grilled). Low-sodium or reduced-sodium tomato and vegetable juices. Low-sodium or reduced-sodium tomato sauce and paste. Low-sodium or reduced-sodium canned vegetables.   Fruits  All fresh, canned (in natural juice), or frozen fruits.  Meat and Other Protein Products  Ground beef (85% or leaner), grass-fed beef, or beef trimmed of fat. Skinless chicken or turkey. Ground chicken or turkey. Pork trimmed of fat. All fish and seafood. Eggs. Dried beans, peas, or lentils. Unsalted nuts and seeds. Unsalted canned beans.  Dairy  Low-fat dairy products, such as skim or 1% milk, 2% or reduced-fat cheeses, low-fat ricotta or cottage cheese, or plain low-fat yogurt. Low-sodium or reduced-sodium cheeses.  Fats and Oils  Tub margarines without trans fats. Light or reduced-fat mayonnaise and salad dressings (reduced sodium). Avocado. Safflower, olive, or canola oils. Natural peanut or almond butter.  Other  Unsalted popcorn and pretzels.  The items listed above may not be a complete list of recommended foods or beverages. Contact your dietitian for more options.  WHAT FOODS ARE NOT RECOMMENDED?  Grains  White bread. White pasta. White rice. Refined cornbread. Bagels and croissants. Crackers that contain trans fat.  Vegetables  Creamed or fried vegetables. Vegetables in a cheese sauce. Regular canned vegetables. Regular canned tomato sauce and paste. Regular tomato and vegetable juices.  Fruits  Dried fruits. Canned fruit in light or heavy syrup. Fruit juice.  Meat and Other Protein   Products  Fatty cuts of meat. Ribs, chicken wings, bacon, sausage, bologna, salami, chitterlings, fatback, hot dogs, bratwurst, and packaged luncheon meats. Salted nuts and seeds. Canned beans with salt.  Dairy  Whole or 2% milk, cream, half-and-half, and cream cheese. Whole-fat or sweetened yogurt. Full-fat cheeses or blue cheese. Nondairy creamers and whipped toppings. Processed cheese, cheese spreads, or cheese  curds.  Condiments  Onion and garlic salt, seasoned salt, table salt, and sea salt. Canned and packaged gravies. Worcestershire sauce. Tartar sauce. Barbecue sauce. Teriyaki sauce. Soy sauce, including reduced sodium. Steak sauce. Fish sauce. Oyster sauce. Cocktail sauce. Horseradish. Ketchup and mustard. Meat flavorings and tenderizers. Bouillon cubes. Hot sauce. Tabasco sauce. Marinades. Taco seasonings. Relishes.  Fats and Oils  Butter, stick margarine, lard, shortening, ghee, and bacon fat. Coconut, palm kernel, or palm oils. Regular salad dressings.  Other  Pickles and olives. Salted popcorn and pretzels.  The items listed above may not be a complete list of foods and beverages to avoid. Contact your dietitian for more information.  WHERE CAN I FIND MORE INFORMATION?  National Heart, Lung, and Blood Institute: www.nhlbi.nih.gov/health/health-topics/topics/dash/     This information is not intended to replace advice given to you by your health care provider. Make sure you discuss any questions you have with your health care provider.     Document Released: 05/25/2011 Document Revised: 06/26/2014 Document Reviewed: 04/09/2013  Elsevier Interactive Patient Education ©2016 Elsevier Inc.

## 2015-11-10 NOTE — Progress Notes (Signed)
Pre visit review using our clinic review tool, if applicable. No additional management support is needed unless otherwise documented below in the visit note. 

## 2015-11-10 NOTE — Progress Notes (Signed)
Subjective:    Patient ID: Veronica Stanley, female    DOB: 02-26-1946, 70 y.o.   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 Not anhedonic Independent with instrumental ADLs No apparent cognitive problems  Generally doing okay Concerned about easy bruising--like on hands if barely touches something No blood in urine, stool or with teeth brushings  She also notes DOE---carrying clothes out to line in backyard Worried about her heart Not really exercising any more No chest pain No dizziness or syncope No palpitations Will notice a sweat sometimes when walking around  Colp to ortho at [facility] in June for her hips Has arthritis-- left more than right No action for now At that visit, BP was 208/93 Has checked it at home-- 140/90 or so  Current Outpatient Prescriptions on File Prior to Visit  Medication Sig Dispense Refill  . Cholecalciferol (VITAMIN D) 1000 UNITS capsule Take 1,000 Units by mouth daily.      . fish oil-omega-3 fatty acids 1000 MG capsule Take 2 g by mouth daily.      . Naproxen Sodium 220 MG CAPS Take by mouth as needed.    Marland Kitchen omeprazole (PRILOSEC) 20 MG capsule Take 1 capsule (20 mg total) by mouth daily. 30 capsule 5   No current facility-administered medications on file prior to visit.    Allergies  Allergen Reactions  . Ibandronate Sodium     REACTION: aches  . Penicillins     Past Medical History  Diagnosis Date  . Diverticulitis     colon  . Hypertension   . Osteoporosis   . GERD (gastroesophageal reflux disease)   . Menopausal symptoms   . Osteoarthrosis involving, or with mention of more than one site, but not specified as generalized, multiple  sites   . Basal cell carcinoma, arm 11/13    Dr Phillip Heal    Past Surgical History  Procedure Laterality Date  . Dilation and curettage of uterus      rupture- peritonitis & hysterectomy 12/79  . Vaginal delivery      x2  . Breast biopsy      Left breast calcifications removed    Family History  Problem Relation Age of Onset  . Osteoporosis Mother   . Coronary artery disease Father   . Breast cancer Sister   . Cancer Maternal Grandfather     colon cancer  . Diabetes Neg Hx   . Hypertension Neg Hx     Social History   Social History  . Marital Status: Divorced    Spouse Name: N/A  . Number of Children: 2  . Years of Education: N/A   Occupational History  . 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      Social History Main Topics  . Smoking status: Never Smoker   . Smokeless tobacco: Never Used  . Alcohol Use: Yes     Comment: occasional  . Drug Use: Not on file  . Sexual Activity: Not on file   Other Topics Concern  . Not on file   Social History Narrative   No living will   Would want daughter, Veronica Stanley, to make health care decisions for her  Would accept resuscitation attempts   Not sure about tube feedings   Review of Systems Weight is stable Appetite is fine Sleeps well--new tempermedic mattress Wears seat belt Teeth okay--regular with dentist Thereasa Parkin) No rash or suspicious skin lesions. Sees Dr Phillip Heal yearly Bowels are fine Voids okay--no problems. Occasional nocturia    Objective:   Physical Exam  Constitutional: She is oriented to person, place, and time. She appears well-developed and well-nourished. No distress.  HENT:  Mouth/Throat: Oropharynx is clear and moist. No oropharyngeal exudate.  Neck: Normal range of motion. Neck supple. No thyromegaly present.  Cardiovascular: Normal rate, regular rhythm, normal heart sounds and intact distal pulses.  Exam reveals no gallop.   No murmur heard. Pulmonary/Chest: Effort  normal and breath sounds normal. No respiratory distress. She has no wheezes. She has no rales.  Abdominal: Soft. There is no tenderness.  Musculoskeletal: She exhibits no edema or tenderness.  Lymphadenopathy:    She has no cervical adenopathy.  Neurological: She is alert and oriented to person, place, and time.  President-- "Reeves Forth" [id number] D-l-r-o-w Recall 3/3  Skin: No rash noted. No erythema.  Psychiatric: She has a normal mood and affect. Her behavior is normal.          Assessment & Plan:

## 2015-11-10 NOTE — Assessment & Plan Note (Signed)
Okay on med 

## 2015-11-10 NOTE — Assessment & Plan Note (Signed)
I have personally reviewed the Medicare Annual Wellness questionnaire and have noted 1. The patient's medical and social history 2. Their use of alcohol, tobacco or illicit drugs 3. Their current medications and supplements 4. The patient's functional ability including ADL's, fall risks, home safety risks and hearing or visual             impairment. 5. Diet and physical activities 6. Evidence for depression or mood disorders  The patients weight, height, BMI and visual acuity have been recorded in the chart I have made referrals, counseling and provided education to the patient based review of the above and I have provided the pt with a written personalized care plan for preventive services.  I have provided you with a copy of your personalized plan for preventive services. Please take the time to review along with your updated medication list.  UTD on imms Colon due 2024 --if appropriate mammo due 2018 Discussed fitness

## 2015-11-10 NOTE — Assessment & Plan Note (Signed)
Not ready for surgery Discussed tylenol and more fitness efforts

## 2015-11-10 NOTE — Assessment & Plan Note (Signed)
BP Readings from Last 3 Encounters:  11/10/15 140/74  11/03/14 150/90  04/08/14 150/90   Generally okay at home High with white coat effect at ortho appt No Rx unless CAD

## 2015-11-10 NOTE — Assessment & Plan Note (Addendum)
Probably deconditioning but can't exclude CAD EKG shows LVH---will check stress test and echo Cardiology prn Discussed fitness

## 2015-11-10 NOTE — Assessment & Plan Note (Signed)
See social history Still hasn't done forms 

## 2015-11-11 LAB — HEPATITIS C ANTIBODY: HCV AB: NEGATIVE

## 2015-11-26 ENCOUNTER — Ambulatory Visit (INDEPENDENT_AMBULATORY_CARE_PROVIDER_SITE_OTHER): Payer: Medicare Other

## 2015-11-26 ENCOUNTER — Other Ambulatory Visit: Payer: Self-pay

## 2015-11-26 DIAGNOSIS — R0609 Other forms of dyspnea: Secondary | ICD-10-CM

## 2015-11-26 LAB — ECHOCARDIOGRAM COMPLETE
AVLVOTPG: 6 mmHg
Ao-asc: 37 cm
CHL CUP DOP CALC LVOT VTI: 28.3 cm
CHL CUP MV DEC (S): 282
E/e' ratio: 6.55
EWDT: 282 ms
FS: 45 % — AB (ref 28–44)
IV/PV OW: 1.02
LA vol index: 31.5 mL/m2
LA vol: 72.5 mL
LADIAMINDEX: 1.65 cm/m2
LASIZE: 38 mm
LAVOLA4C: 63.8 mL
LEFT ATRIUM END SYS DIAM: 38 mm
LV E/e'average: 6.55
LV PW d: 10.5 mm — AB (ref 0.6–1.1)
LV e' LATERAL: 12.6 cm/s
LVEEMED: 6.55
LVOT SV: 108 mL
LVOT area: 3.8 cm2
LVOT peak vel: 127 cm/s
LVOTD: 22 mm
MV Peak grad: 3 mmHg
MVPKAVEL: 67.4 m/s
MVPKEVEL: 82.5 m/s
TAPSE: 18 mm
TDI e' lateral: 12.6
TDI e' medial: 7.07

## 2015-11-26 LAB — EXERCISE TOLERANCE TEST
CHL CUP MPHR: 149 {beats}/min
CHL CUP RESTING HR STRESS: 84 {beats}/min
CSEPEDS: 22 s
CSEPPHR: 134 {beats}/min
Estimated workload: 5 METS
Exercise duration (min): 3 min
Percent HR: 89 %

## 2016-03-17 ENCOUNTER — Other Ambulatory Visit: Payer: Self-pay | Admitting: Internal Medicine

## 2016-03-17 DIAGNOSIS — Z1231 Encounter for screening mammogram for malignant neoplasm of breast: Secondary | ICD-10-CM

## 2016-03-27 ENCOUNTER — Other Ambulatory Visit: Payer: Self-pay | Admitting: Internal Medicine

## 2016-04-12 ENCOUNTER — Ambulatory Visit: Payer: Medicare Other

## 2016-04-17 ENCOUNTER — Ambulatory Visit
Admission: RE | Admit: 2016-04-17 | Discharge: 2016-04-17 | Disposition: A | Discharge status: Home / Self Care | Payer: Medicare Other | Source: Ambulatory Visit | Attending: Internal Medicine | Admitting: Internal Medicine

## 2016-04-17 DIAGNOSIS — Z1231 Encounter for screening mammogram for malignant neoplasm of breast: Secondary | ICD-10-CM

## 2016-05-23 LAB — FECAL OCCULT BLOOD, GUAIAC: Fecal Occult Blood: NEGATIVE

## 2016-06-06 ENCOUNTER — Encounter: Payer: Self-pay | Admitting: Internal Medicine

## 2016-08-14 ENCOUNTER — Telehealth: Payer: Self-pay | Admitting: Internal Medicine

## 2016-08-14 NOTE — Telephone Encounter (Signed)
Left detailed message on VM per DPR. 

## 2016-08-14 NOTE — Telephone Encounter (Signed)
Patient received Shannon's message. Patient doesn't have a fever or abdominal pain.  Patient said she won't need an office visit.

## 2016-08-14 NOTE — Telephone Encounter (Signed)
Please check on her. I am not a fan of imodium for infections--can prolong the infection. I am not sure she needs visit at all if she doesn't have sig pain or fever Can add at 4:45PM if she feels she needs to be seen. Just needs to keep up with fluids till the diarrhea abates. She can also eat as tolerated if no N/V

## 2016-08-14 NOTE — Telephone Encounter (Signed)
Patient Name: Veronica Stanley  DOB: [birth year]    Initial Comment Caller states had diarrhea last night again after being sick all last week; what can take?    Nurse Assessment  Nurse: Raphael Gibney, RN, Vanita Ingles Date/Time (Eastern Time): 08/14/2016 9:16:00 AM  Confirm and document reason for call. If symptomatic, describe symptoms. ---Caller states she had diarrhea last week. Had diarrhea again last night. had diarrhea on Wednesday, Thursday and Friday am. No fever. Diarrhea is watery. She is drinking gatorade and water. Had nausea on Sunday last week. No vomiting. Had 3-4 diarrhea stools last night. She is urinating.  Does the patient have any new or worsening symptoms? ---Yes  Will a triage be completed? ---Yes  Related visit to physician within the last 2 weeks? ---No  Does the PT have any chronic conditions? (i.e. diabetes, asthma, etc.) ---No  Is this a behavioral health or substance abuse call? ---No     Guidelines    Guideline Title Affirmed Question Affirmed Notes  Diarrhea [1] MODERATE diarrhea (e.g., 4-6 times / day more than normal) AND [2] age > 2 years    Final Disposition User   See Physician within 24 Hours [place], RN, Vera    Comments  No appts available with Dr. Silvio Pate. Pt does not want to schedule appt with anyone else unless Dr. Silvio Pate thinks she needs to. She would like recommendation for diarrhea medication too.   Referrals  GO TO FACILITY REFUSED   Disagree/Comply: Comply

## 2016-11-22 ENCOUNTER — Ambulatory Visit (INDEPENDENT_AMBULATORY_CARE_PROVIDER_SITE_OTHER): Payer: Medicare Other | Admitting: Internal Medicine

## 2016-11-22 ENCOUNTER — Encounter: Payer: Self-pay | Admitting: Internal Medicine

## 2016-11-22 VITALS — BP 140/82 | HR 71 | Temp 98.2°F | Ht 67.0 in | Wt 223.0 lb

## 2016-11-22 DIAGNOSIS — Z0000 Encounter for general adult medical examination without abnormal findings: Secondary | ICD-10-CM | POA: Diagnosis not present

## 2016-11-22 DIAGNOSIS — Z7189 Other specified counseling: Secondary | ICD-10-CM

## 2016-11-22 DIAGNOSIS — I10 Essential (primary) hypertension: Secondary | ICD-10-CM

## 2016-11-22 DIAGNOSIS — M160 Bilateral primary osteoarthritis of hip: Secondary | ICD-10-CM

## 2016-11-22 DIAGNOSIS — K219 Gastro-esophageal reflux disease without esophagitis: Secondary | ICD-10-CM | POA: Diagnosis not present

## 2016-11-22 LAB — CBC WITH DIFFERENTIAL/PLATELET
BASOS ABS: 0 10*3/uL (ref 0.0–0.1)
Basophils Relative: 0.7 % (ref 0.0–3.0)
EOS ABS: 0.2 10*3/uL (ref 0.0–0.7)
Eosinophils Relative: 2.7 % (ref 0.0–5.0)
HEMATOCRIT: 38.3 % (ref 36.0–46.0)
HEMOGLOBIN: 12.6 g/dL (ref 12.0–15.0)
LYMPHS PCT: 36.3 % (ref 12.0–46.0)
Lymphs Abs: 2 10*3/uL (ref 0.7–4.0)
MCHC: 32.8 g/dL (ref 30.0–36.0)
MCV: 88.4 fl (ref 78.0–100.0)
MONO ABS: 0.6 10*3/uL (ref 0.1–1.0)
Monocytes Relative: 10.2 % (ref 3.0–12.0)
Neutro Abs: 2.8 10*3/uL (ref 1.4–7.7)
Neutrophils Relative %: 50.1 % (ref 43.0–77.0)
PLATELETS: 282 10*3/uL (ref 150.0–400.0)
RBC: 4.33 Mil/uL (ref 3.87–5.11)
RDW: 14.3 % (ref 11.5–15.5)
WBC: 5.5 10*3/uL (ref 4.0–10.5)

## 2016-11-22 LAB — COMPREHENSIVE METABOLIC PANEL
ALBUMIN: 4 g/dL (ref 3.5–5.2)
ALT: 10 U/L (ref 0–35)
AST: 14 U/L (ref 0–37)
Alkaline Phosphatase: 67 U/L (ref 39–117)
BILIRUBIN TOTAL: 0.4 mg/dL (ref 0.2–1.2)
BUN: 16 mg/dL (ref 6–23)
CALCIUM: 9.5 mg/dL (ref 8.4–10.5)
CO2: 30 mEq/L (ref 19–32)
CREATININE: 0.75 mg/dL (ref 0.40–1.20)
Chloride: 104 mEq/L (ref 96–112)
GFR: 80.53 mL/min (ref >60.00)
Glucose, Bld: 105 mg/dL — ABNORMAL HIGH (ref 70–99)
Potassium: 4.5 mEq/L (ref 3.5–5.1)
SODIUM: 139 meq/L (ref 135–145)
Total Protein: 7.6 g/dL (ref 6.0–8.3)

## 2016-11-22 NOTE — Assessment & Plan Note (Signed)
Urged her to do formal advanced directives

## 2016-11-22 NOTE — Assessment & Plan Note (Signed)
BP Readings from Last 3 Encounters:  11/22/16 140/82  11/10/15 140/74  11/03/14 (!) 150/90   Acceptable with lifestyle changes No meds

## 2016-11-22 NOTE — Assessment & Plan Note (Signed)
Better recently PPI prn only now

## 2016-11-22 NOTE — Assessment & Plan Note (Signed)
I have personally reviewed the Medicare Annual Wellness questionnaire and have noted 1. The patient's medical and social history 2. Their use of alcohol, tobacco or illicit drugs 3. Their current medications and supplements 4. The patient's functional ability including ADL's, fall risks, home safety risks and hearing or visual             impairment. 5. Diet and physical activities 6. Evidence for depression or mood disorders  The patients weight, height, BMI and visual acuity have been recorded in the chart I have made referrals, counseling and provided education to the patient based review of the above and I have provided the pt with a written personalized care plan for preventive services.  I have provided you with a copy of your personalized plan for preventive services. Please take the time to review along with your updated medication list.  UTD on vaccines---yearly flu vaccine Normal colon 2014 Recent mammogram No Pap due to age Working on fitness

## 2016-11-22 NOTE — Progress Notes (Signed)
Subjective:    Patient ID: Veronica Stanley, female    DOB: Jan 01, 1946, 71 y.o.   MRN: [id number]  HPI Here for Medicare wellness visit and follow up of chronic health conditions Reviewed form and advanced directives Reviewed other doctors Occasional alcohol No tobacco Now exercising regularly [place] once getting out of shower (slipped in old slippers)---hit head and had goose egg and bruising on right side (elbow and leg). Didn't get evaluated No longer working Vision and hearing are fine Independent with instrumental ADLs No depression or anhedonia Memory seems fine--other than names  Exercise tolerance is better! No chest pain No SOB No dizziness or syncope  Ongoing hip pain--especially on left Has improved since doing the treadmill regularly Occasional feeling of it giving way Tylenol arthritis and occasional BC (works quick)  Uses the omeprazole prn now Hasn't been needing much since exercising No regular heartburn  No dysphagia No regular cough  Current Outpatient Prescriptions on File Prior to Visit  Medication Sig Dispense Refill  . Cholecalciferol (VITAMIN D) 1000 UNITS capsule Take 1,000 Units by mouth daily.      . fish oil-omega-3 fatty acids 1000 MG capsule Take 2 g by mouth daily.      . Naproxen Sodium 220 MG CAPS Take by mouth as needed.    Marland Kitchen omeprazole (PRILOSEC) 20 MG capsule TAKE ONE CAPSULE BY MOUTH DAILY 30 capsule 8   No current facility-administered medications on file prior to visit.     Allergies  Allergen Reactions  . Ibandronate Sodium     REACTION: aches  . Penicillins     Past Medical History:  Diagnosis Date  . Basal cell carcinoma, arm 11/13   Dr Phillip Heal  . Diverticulitis    colon  . GERD (gastroesophageal reflux disease)   . Hypertension   . Menopausal symptoms   . Osteoarthrosis involving, or with mention of more than one site, but not specified as generalized, multiple sites   . Osteoporosis     Past Surgical History:    Procedure Laterality Date  . ABDOMINAL HYSTERECTOMY    . BREAST BIOPSY Left 2003   Left breast calcifications removed  . DILATION AND CURETTAGE OF UTERUS     rupture- peritonitis & hysterectomy 12/79  . VAGINAL DELIVERY     x2    Family History  Problem Relation Age of Onset  . Osteoporosis Mother   . Coronary artery disease Father   . Breast cancer Sister 64  . Cancer Maternal Grandfather        colon cancer  . Diabetes Neg Hx   . Hypertension Neg Hx     Social History   Social History  . Marital status: Divorced    Spouse name: N/A  . Number of children: 2  . Years of education: N/A   Occupational History  . 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      Social History Main Topics  . Smoking status: Never Smoker  . Smokeless tobacco: Never Used  . Alcohol use Yes     Comment: occasional  . Drug use: Unknown  . Sexual activity: Not on file   Other Topics Concern  . Not on file   Social History Narrative   No living will   Would want daughter, Margarita Grizzle,  Then son Ginnie Smart to make health care decisions for her   Would accept resuscitation attempts   Not sure about tube feedings   Review of Systems Now  at [employer] and regular exercise Lost 15# since last year! Sleeps okay--despite frequent nocturia Wears seat belt Teeth are fine-keeps up with dentist Bowels are fine. No blood unless hemorrhoid flares (and none in stool) Colon benign in 2014--Dr Elliott No skin issues---sees derm regularly. Just thin skin     Objective:   Physical Exam  Constitutional: She is oriented to person, place, and time. She appears well-developed. No distress.  HENT:  Mouth/Throat: Oropharynx is clear and moist. No oropharyngeal exudate.  Neck: No thyromegaly present.  Cardiovascular: Normal rate, regular rhythm, normal heart sounds and intact distal pulses.  Exam reveals no gallop.   No murmur heard. Pulmonary/Chest: Effort normal and breath  sounds normal. No respiratory distress. She has no wheezes. She has no rales.  Abdominal: Soft. There is no tenderness.  Musculoskeletal: She exhibits no edema or tenderness.  Lymphadenopathy:    She has no cervical adenopathy.  Neurological: She is alert and oriented to person, place, and time.  President-- "Dwaine Deter, Bush" [phone] [id number] Recall 3/3  Skin: No rash noted. No erythema.  Psychiatric: She has a normal mood and affect. Her behavior is normal.          Assessment & Plan:

## 2016-11-22 NOTE — Assessment & Plan Note (Signed)
Left hip is worse Not ready for THR at this point---improved with exercise

## 2017-03-14 ENCOUNTER — Other Ambulatory Visit: Payer: Self-pay | Admitting: Internal Medicine

## 2017-03-14 DIAGNOSIS — Z1231 Encounter for screening mammogram for malignant neoplasm of breast: Secondary | ICD-10-CM

## 2017-04-16 ENCOUNTER — Other Ambulatory Visit: Payer: Self-pay | Admitting: Internal Medicine

## 2017-04-23 ENCOUNTER — Ambulatory Visit
Admission: RE | Admit: 2017-04-23 | Discharge: 2017-04-23 | Disposition: A | Discharge status: Home / Self Care | Payer: Medicare Other | Source: Ambulatory Visit | Attending: Internal Medicine | Admitting: Internal Medicine

## 2017-04-23 DIAGNOSIS — Z1231 Encounter for screening mammogram for malignant neoplasm of breast: Secondary | ICD-10-CM | POA: Insufficient documentation

## 2017-04-24 ENCOUNTER — Encounter: Payer: Self-pay | Admitting: *Deleted

## 2017-08-28 ENCOUNTER — Encounter (INDEPENDENT_AMBULATORY_CARE_PROVIDER_SITE_OTHER): Payer: Self-pay

## 2017-09-05 ENCOUNTER — Encounter: Payer: Self-pay | Admitting: Internal Medicine

## 2017-09-05 ENCOUNTER — Ambulatory Visit (INDEPENDENT_AMBULATORY_CARE_PROVIDER_SITE_OTHER): Payer: Medicare Other | Admitting: Internal Medicine

## 2017-09-05 VITALS — BP 128/88 | HR 72 | Temp 97.9°F | Wt 226.0 lb

## 2017-09-05 DIAGNOSIS — M160 Bilateral primary osteoarthritis of hip: Secondary | ICD-10-CM | POA: Diagnosis not present

## 2017-09-05 NOTE — Assessment & Plan Note (Signed)
Left seems to be quite advanced Will set up with surgeon for evaluation Discussed quad strengthening in case needs surgery

## 2017-09-05 NOTE — Progress Notes (Signed)
Subjective:    Patient ID: Veronica Stanley, female    DOB: Sep 16, 1945, 72 y.o.   MRN: [id number]  HPI Here due to worse hip pain  Has been exercising 3 days a week at the gym Using treadmill and building up her speed---but then couldn't even walk after getting off Everyone notices her bad gait Left leg turns in  Left leg gives way due to pain  Uses BC, aleve or tylenol arthritis--- some help  Current Outpatient Medications on File Prior to Visit  Medication Sig Dispense Refill  . Cholecalciferol (VITAMIN D) 1000 UNITS capsule Take 1,000 Units by mouth daily.      . fish oil-omega-3 fatty acids 1000 MG capsule Take 2 g by mouth daily.      . Naproxen Sodium 220 MG CAPS Take by mouth as needed.    Marland Kitchen omeprazole (PRILOSEC) 20 MG capsule TAKE ONE CAPSULE BY MOUTH DAILY 30 capsule 11   No current facility-administered medications on file prior to visit.     Allergies  Allergen Reactions  . Ibandronate Sodium     REACTION: aches  . Penicillins     Past Medical History:  Diagnosis Date  . Basal cell carcinoma, arm 11/13   Dr Phillip Heal  . Diverticulitis    colon  . GERD (gastroesophageal reflux disease)   . Hypertension   . Menopausal symptoms   . Osteoarthrosis involving, or with mention of more than one site, but not specified as generalized, multiple sites   . Osteoporosis     Past Surgical History:  Procedure Laterality Date  . ABDOMINAL HYSTERECTOMY    . BREAST BIOPSY Left 2003   Left breast calcifications removed  . DILATION AND CURETTAGE OF UTERUS     rupture- peritonitis & hysterectomy 12/79  . VAGINAL DELIVERY     x2    Family History  Problem Relation Age of Onset  . Osteoporosis Mother   . Coronary artery disease Father   . Breast cancer Sister 33  . Cancer Maternal Grandfather        colon cancer  . Diabetes Neg Hx   . Hypertension Neg Hx     Social History   Socioeconomic History  . Marital status: Divorced    Spouse name: Not on file  . Number  of children: 2  . Years of education: Not on file  . Highest education level: Not on file  Social Needs  . Financial resource strain: Not on file  . Food insecurity - worry: Not on file  . Food insecurity - inability: Not on file  . Transportation needs - medical: Not on file  . Transportation needs - non-medical: Not on file  Occupational History  . 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 Occupation:    Tobacco Use  . Smoking status: Never Smoker  . Smokeless tobacco: Never Used  Substance and Sexual Activity  . Alcohol use: Yes    Comment: occasional  . Drug use: Not on file  . Sexual activity: Not on file  Other Topics Concern  . Not on file  Social History Narrative   No living will   Would want daughter, Veronica Stanley,  Then son Veronica Stanley to make health care decisions for her   Would accept resuscitation attempts   Not sure about tube feedings   Review of Systems Right hip is not so bad No other major joint issues (though right knee does hurt)    Objective:  Physical Exam  Constitutional: No distress.  Musculoskeletal:  Antalgic "hip" gait No internal rotation of left hip          Assessment & Plan:

## 2017-09-17 ENCOUNTER — Ambulatory Visit (INDEPENDENT_AMBULATORY_CARE_PROVIDER_SITE_OTHER): Payer: Medicare Other

## 2017-09-17 ENCOUNTER — Ambulatory Visit (INDEPENDENT_AMBULATORY_CARE_PROVIDER_SITE_OTHER): Payer: Medicare Other | Admitting: Orthopaedic Surgery

## 2017-09-17 ENCOUNTER — Encounter (INDEPENDENT_AMBULATORY_CARE_PROVIDER_SITE_OTHER): Payer: Self-pay | Admitting: Orthopaedic Surgery

## 2017-09-17 DIAGNOSIS — M1612 Unilateral primary osteoarthritis, left hip: Secondary | ICD-10-CM

## 2017-09-17 DIAGNOSIS — M25552 Pain in left hip: Secondary | ICD-10-CM | POA: Diagnosis not present

## 2017-09-17 NOTE — Progress Notes (Signed)
Office Visit Note   Patient: Veronica Stanley           Date of Birth: 12-04-1945           MRN: [id number] Visit Date: 09/17/2017              Requested by: Venia Carbon, MD [place], [place] [id number] PCP: Venia Carbon, MD   Assessment & Plan: Visit Diagnoses:  1. Pain in left hip   2. Unilateral primary osteoarthritis, left hip     Plan: At this point given her x-ray findings of severe left hip arthritis combined with her clinical exam findings we are recommending hip replacement surgery.  This would be through a direct anterior approach.  We went over hip model and her x-rays and explained in detail what the surgery involves.  We had a long and thorough discussion about hip replacement surgery including the risk and benefits of surgery.  We talked about her intraoperative and postoperative course.  All questions and concerns were answered and addressed.  She does wish to proceed with the surgery sometime in the next month or so.  We will give her a call from our surgery scheduler and, with a date and time.  Follow-Up Instructions: Return for 2 weeks post-op.   Orders:  Orders Placed This Encounter  Procedures  . XR HIP UNILAT W OR W/O PELVIS 1V LEFT   No orders of the defined types were placed in this encounter.     Procedures: No procedures performed   Clinical Data: No additional findings.   Subjective: Chief Complaint  Patient presents with  . Right Hip - Pain  . Left Hip - Pain  Patient comes in today for evaluation treatment of left hip pain and known osteoarthritis of the left hip.  She is seeing Dr. Silvio Pate for some time.  She is had pain for many years now is in the groin.  Her daughter is with her and says she is walking with more of a limp and cannot do stairs well and is complaining of pain on a daily basis.  She is worked on activity modification and weight loss.  She is taken a lot of anti-inflammatories to the point where it  started to bother her stomach.  Her pain is detrimentally affecting her active daily living, quality of life, mobility.  It can be 10 out of 10 at times.  Is mainly in the left hip and groin area.  She is not a diabetic and is not a smoker.  HPI  Review of Systems She currently denies any headache, chest pain, shortness of breath, fever, chills, nausea, vomiting.  Objective: Vital Signs: There were no vitals taken for this visit.  Physical Exam She is alert and oriented x3 and in no acute distress Ortho Exam Examination of her left hip shows significant pain on attempts of internal extra rotation and decreased motion stiffness with rotation. Specialty Comments:  No specialty comments available.  Imaging: Xr Hip Unilat W Or W/o Pelvis 1v Left  Result Date: 09/17/2017 An AP pelvis and lateral left hip shows significant joint space narrowing of both hips.  There are sclerotic changes and cystic changes in the femoral head on the left hip.  There are periarticular osteophytes as well.    PMFS History: Patient Active Problem List   Diagnosis Date Noted  . Unilateral primary osteoarthritis, left hip 09/17/2017  . Pain in left hip 09/17/2017  . Osteoarthritis  of both hips 11/10/2015  . Advance directive discussed with patient 11/03/2014  . Routine general medical examination at a health care facility 03/03/2011  . Osteoarthritis, multiple sites   . GERD 10/11/2007  . ACTINIC KERATOSIS 04/17/2007  . Essential hypertension, benign 03/28/2007  . DIVERTICULOSIS, COLON 03/28/2007  . OSTEOPOROSIS 03/28/2007   Past Medical History:  Diagnosis Date  . Basal cell carcinoma, arm 11/13   Dr Phillip Heal  . Diverticulitis    colon  . GERD (gastroesophageal reflux disease)   . Hypertension   . Menopausal symptoms   . Osteoarthrosis involving, or with mention of more than one site, but not specified as generalized, multiple sites   . Osteoporosis     Family History  Problem Relation Age of  Onset  . Osteoporosis Mother   . Coronary artery disease Father   . Breast cancer Sister 78  . Cancer Maternal Grandfather        colon cancer  . Diabetes Neg Hx   . Hypertension Neg Hx     Past Surgical History:  Procedure Laterality Date  . ABDOMINAL HYSTERECTOMY    . BREAST BIOPSY Left 2003   Left breast calcifications removed  . DILATION AND CURETTAGE OF UTERUS     rupture- peritonitis & hysterectomy 12/79  . VAGINAL DELIVERY     x2   Social History   Occupational History  . 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 Occupation:    Tobacco Use  . Smoking status: Never Smoker  . Smokeless tobacco: Never Used  Substance and Sexual Activity  . Alcohol use: Yes    Comment: occasional  . Drug use: Not on file  . Sexual activity: Not on file

## 2017-10-04 ENCOUNTER — Other Ambulatory Visit (INDEPENDENT_AMBULATORY_CARE_PROVIDER_SITE_OTHER): Payer: Self-pay | Admitting: Physician Assistant

## 2017-10-11 NOTE — Patient Instructions (Signed)
Veronica Stanley  10/11/2017   Your procedure is scheduled on: 10/19/2017    Report to [facility] Main  Entrance  Report to admitting at    [place] AM    Call this number if you have problems the morning of surgery [phone]   Remember: Do not eat food or drink liquids :After Midnight.     Take these medicines the morning of surgery with A SIP OF WATER: Prilosec                                 You may not have any metal on your body including hair pins and              piercings  Do not wear jewelry, make-up, lotions, powders or perfumes, deodorant             Do not wear nail polish.  Do not shave  48 hours prior to surgery.               Do not bring valuables to the hospital. [place].  Contacts, dentures or bridgework may not be worn into surgery.  Leave suitcase in the car. After surgery it may be brought to your room.                   Please read over the following fact sheets you were given: _____________________________________________________________________             [facility] - Preparing for Surgery Before surgery, you can play an important role.  Because skin is not sterile, your skin needs to be as free of germs as possible.  You can reduce the number of germs on your skin by washing with CHG (chlorahexidine gluconate) soap before surgery.  CHG is an antiseptic cleaner which kills germs and bonds with the skin to continue killing germs even after washing. Please DO NOT use if you have an allergy to CHG or antibacterial soaps.  If your skin becomes reddened/irritated stop using the CHG and inform your nurse when you arrive at Short Stay. Do not shave (including legs and underarms) for at least 48 hours prior to the first CHG shower.  You may shave your face/neck. Please follow these instructions carefully:  1.  Shower with CHG Soap the night before surgery and the  morning of Surgery.  2.   If you choose to wash your hair, wash your hair first as usual with your  normal  shampoo.  3.  After you shampoo, rinse your hair and body thoroughly to remove the  shampoo.                           4.  Use CHG as you would any other liquid soap.  You can apply chg directly  to the skin and wash                       Gently with a scrungie or clean washcloth.  5.  Apply the CHG Soap to your body ONLY FROM THE NECK DOWN.   Do not use on face/ open  Wound or open sores. Avoid contact with eyes, ears mouth and genitals (private parts).                       Wash face,  Genitals (private parts) with your normal soap.             6.  Wash thoroughly, paying special attention to the area where your surgery  will be performed.  7.  Thoroughly rinse your body with warm water from the neck down.  8.  DO NOT shower/wash with your normal soap after using and rinsing off  the CHG Soap.                9.  Pat yourself dry with a clean towel.            10.  Wear clean pajamas.            11.  Place clean sheets on your bed the night of your first shower and do not  sleep with pets. Day of Surgery : Do not apply any lotions/deodorants the morning of surgery.  Please wear clean clothes to the hospital/surgery center.  FAILURE TO FOLLOW THESE INSTRUCTIONS MAY RESULT IN THE CANCELLATION OF YOUR SURGERY PATIENT SIGNATURE_________________________________  NURSE SIGNATURE__________________________________  ________________________________________________________________________  WHAT IS A BLOOD TRANSFUSION? Blood Transfusion Information  A transfusion is the replacement of blood or some of its parts. Blood is made up of multiple cells which provide different functions.  Red blood cells carry oxygen and are used for blood loss replacement.  White blood cells fight against infection.  Platelets control bleeding.  Plasma helps clot blood.  Other blood products are available for  specialized needs, such as hemophilia or other clotting disorders. BEFORE THE TRANSFUSION  Who gives blood for transfusions?   Healthy volunteers who are fully evaluated to make sure their blood is safe. This is blood bank blood. Transfusion therapy is the safest it has ever been in the practice of medicine. Before blood is taken from a donor, a complete history is taken to make sure that person has no history of diseases nor engages in risky social behavior (examples are intravenous drug use or sexual activity with multiple partners). The donor's travel history is screened to minimize risk of transmitting infections, such as malaria. The donated blood is tested for signs of infectious diseases, such as HIV and hepatitis. The blood is then tested to be sure it is compatible with you in order to minimize the chance of a transfusion reaction. If you or a relative donates blood, this is often done in anticipation of surgery and is not appropriate for emergency situations. It takes many days to process the donated blood. RISKS AND COMPLICATIONS Although transfusion therapy is very safe and saves many lives, the main dangers of transfusion include:   Getting an infectious disease.  Developing a transfusion reaction. This is an allergic reaction to something in the blood you were given. Every precaution is taken to prevent this. The decision to have a blood transfusion has been considered carefully by your caregiver before blood is given. Blood is not given unless the benefits outweigh the risks. AFTER THE TRANSFUSION  Right after receiving a blood transfusion, you will usually feel much better and more energetic. This is especially true if your red blood cells have gotten low (anemic). The transfusion raises the level of the red blood cells which carry oxygen, and this usually causes an energy increase.  The  nurse administering the transfusion will monitor you carefully for complications. HOME CARE  INSTRUCTIONS  No special instructions are needed after a transfusion. You may find your energy is better. Speak with your caregiver about any limitations on activity for underlying diseases you may have. SEEK MEDICAL CARE IF:   Your condition is not improving after your transfusion.  You develop redness or irritation at the intravenous (IV) site. SEEK IMMEDIATE MEDICAL CARE IF:  Any of the following symptoms occur over the next 12 hours:  Shaking chills.  You have a temperature by mouth above 102 F (38.9 C), not controlled by medicine.  Chest, back, or muscle pain.  People around you feel you are not acting correctly or are confused.  Shortness of breath or difficulty breathing.  Dizziness and fainting.  You get a rash or develop hives.  You have a decrease in urine output.  Your urine turns a dark color or changes to pink, red, or brown. Any of the following symptoms occur over the next 10 days:  You have a temperature by mouth above 102 F (38.9 C), not controlled by medicine.  Shortness of breath.  Weakness after normal activity.  The white part of the eye turns yellow (jaundice).  You have a decrease in the amount of urine or are urinating less often.  Your urine turns a dark color or changes to pink, red, or brown. Document Released: 06/02/2000 Document Revised: 08/28/2011 Document Reviewed: 01/20/2008 ExitCare Patient Information 2014 [place].  _______________________________________________________________________  Incentive Spirometer  An incentive spirometer is a tool that can help keep your lungs clear and active. This tool measures how well you are filling your lungs with each breath. Taking long deep breaths may help reverse or decrease the chance of developing breathing (pulmonary) problems (especially infection) following:  A long period of time when you are unable to move or be active. BEFORE THE PROCEDURE   If the spirometer includes an  indicator to show your best effort, your nurse or respiratory therapist will set it to a desired goal.  If possible, sit up straight or lean slightly forward. Try not to slouch.  Hold the incentive spirometer in an upright position. INSTRUCTIONS FOR USE  1. Sit on the edge of your bed if possible, or sit up as far as you can in bed or on a chair. 2. Hold the incentive spirometer in an upright position. 3. Breathe out normally. 4. Place the mouthpiece in your mouth and seal your lips tightly around it. 5. Breathe in slowly and as deeply as possible, raising the piston or the ball toward the top of the column. 6. Hold your breath for 3-5 seconds or for as long as possible. Allow the piston or ball to fall to the bottom of the column. 7. Remove the mouthpiece from your mouth and breathe out normally. 8. Rest for a few seconds and repeat Steps 1 through 7 at least 10 times every 1-2 hours when you are awake. Take your time and take a few normal breaths between deep breaths. 9. The spirometer may include an indicator to show your best effort. Use the indicator as a goal to work toward during each repetition. 10. After each set of 10 deep breaths, practice coughing to be sure your lungs are clear. If you have an incision (the cut made at the time of surgery), support your incision when coughing by placing a pillow or rolled up towels firmly against it. Once you are able to get  out of bed, walk around indoors and cough well. You may stop using the incentive spirometer when instructed by your caregiver.  RISKS AND COMPLICATIONS  Take your time so you do not get dizzy or light-headed.  If you are in pain, you may need to take or ask for pain medication before doing incentive spirometry. It is harder to take a deep breath if you are having pain. AFTER USE  Rest and breathe slowly and easily.  It can be helpful to keep track of a log of your progress. Your caregiver can provide you with a simple table  to help with this. If you are using the spirometer at home, follow these instructions: [place] IF:   You are having difficultly using the spirometer.  You have trouble using the spirometer as often as instructed.  Your pain medication is not giving enough relief while using the spirometer.  You develop fever of 100.5 F (38.1 C) or higher. SEEK IMMEDIATE MEDICAL CARE IF:   You cough up bloody sputum that had not been present before.  You develop fever of 102 F (38.9 C) or greater.  You develop worsening pain at or near the incision site. MAKE SURE YOU:   Understand these instructions.  Will watch your condition.  Will get help right away if you are not doing well or get worse. Document Released: 10/16/2006 Document Revised: 08/28/2011 Document Reviewed: 12/17/2006 [facility] Patient Information 2014 [place], [place].   ________________________________________________________________________

## 2017-10-12 ENCOUNTER — Other Ambulatory Visit (INDEPENDENT_AMBULATORY_CARE_PROVIDER_SITE_OTHER): Payer: Self-pay

## 2017-10-15 ENCOUNTER — Encounter (HOSPITAL_COMMUNITY)
Admission: RE | Admit: 2017-10-15 | Discharge: 2017-10-15 | Disposition: A | Discharge status: Home / Self Care | Payer: Medicare Other | Source: Ambulatory Visit | Attending: Orthopaedic Surgery | Admitting: Orthopaedic Surgery

## 2017-10-15 ENCOUNTER — Other Ambulatory Visit: Payer: Self-pay

## 2017-10-15 ENCOUNTER — Encounter (HOSPITAL_COMMUNITY): Payer: Self-pay

## 2017-10-15 DIAGNOSIS — Z01812 Encounter for preprocedural laboratory examination: Secondary | ICD-10-CM | POA: Diagnosis present

## 2017-10-15 DIAGNOSIS — Z01810 Encounter for preprocedural cardiovascular examination: Secondary | ICD-10-CM | POA: Insufficient documentation

## 2017-10-15 LAB — CBC
HCT: 39.2 % (ref 36.0–46.0)
Hemoglobin: 12.3 g/dL (ref 12.0–15.0)
MCH: 29.1 pg (ref 26.0–34.0)
MCHC: 31.4 g/dL (ref 30.0–36.0)
MCV: 92.7 fL (ref 78.0–100.0)
PLATELETS: 258 10*3/uL (ref 150–400)
RBC: 4.23 MIL/uL (ref 3.87–5.11)
RDW: 13.6 % (ref 11.5–15.5)
WBC: 6.2 10*3/uL (ref 4.0–10.5)

## 2017-10-15 LAB — ABO/RH: ABO/RH(D): O NEG

## 2017-10-15 LAB — BASIC METABOLIC PANEL
Anion gap: 9 (ref 5–15)
BUN: 17 mg/dL (ref 6–20)
CHLORIDE: 106 mmol/L (ref 101–111)
CO2: 26 mmol/L (ref 22–32)
CREATININE: 0.76 mg/dL (ref 0.44–1.00)
Calcium: 9.4 mg/dL (ref 8.9–10.3)
GFR calc non Af Amer: >60 mL/min (ref >60)
Glucose, Bld: 88 mg/dL (ref 65–99)
Potassium: 4.7 mmol/L (ref 3.5–5.1)
Sodium: 141 mmol/L (ref 135–145)

## 2017-10-15 LAB — SURGICAL PCR SCREEN
MRSA, PCR: NEGATIVE
Staphylococcus aureus: NEGATIVE

## 2017-10-15 NOTE — Progress Notes (Signed)
.......  08/21/1945  patient's date of birth is  Your patient has screened at an elevated risk for obstructive sleep apnea using the STOP-Bang tool during a presurgical visit. A score of 5 or greater is an elevated risk.Stop Bang Score at preop appt was a 5.

## 2017-10-15 NOTE — Progress Notes (Signed)
Patient has a friend that is Physical therapist with [facility] [phone].  Ronette Deter- [phone] - who could do In Home Physical Therapy for her after surgery.  FYI.

## 2017-10-15 NOTE — Progress Notes (Signed)
2017- Stress Test- epic  ECHO-12/06/15-epic

## 2017-10-16 NOTE — Progress Notes (Signed)
Final EKG done 10/15/17-epic.

## 2017-10-18 MED ORDER — TRANEXAMIC ACID 1000 MG/10ML IV SOLN
1000.0000 mg | INTRAVENOUS | Status: AC
  Administered 2017-10-19: 1000 mg via INTRAVENOUS
  Filled 2017-10-18: qty 1100

## 2017-10-18 NOTE — Anesthesia Preprocedure Evaluation (Addendum)
Anesthesia Evaluation  Patient identified by MRN, date of birth, ID band Patient awake    Reviewed: Allergy & Precautions, NPO status , Patient's Chart, lab work & pertinent test results  Airway Mallampati: II  TM Distance: >3 FB Neck ROM: Full    Dental no notable dental hx.    Pulmonary neg pulmonary ROS, sleep apnea and Continuous Positive Airway Pressure Ventilation ,    Pulmonary exam normal breath sounds clear to auscultation       Cardiovascular Exercise Tolerance: Good hypertension, Normal cardiovascular exam Rhythm:Regular Rate:Normal     Neuro/Psych negative neurological ROS  negative psych ROS   GI/Hepatic Neg liver ROS,   Endo/Other  negative endocrine ROS  Renal/GU negative Renal ROS  negative genitourinary   Musculoskeletal   Abdominal (+) + obese,   Peds  Hematology negative hematology ROS (+)   Anesthesia Other Findings   Reproductive/Obstetrics negative OB ROS                          No results found for: INR, PROTIME  Lab Results  Component Value Date   WBC 6.2 10/15/2017   HGB 12.3 10/15/2017   HCT 39.2 10/15/2017   MCV 92.7 10/15/2017   PLT 258 10/15/2017   Lab Results  Component Value Date   CREATININE 0.76 10/15/2017   BUN 17 10/15/2017   NA 141 10/15/2017   K 4.7 10/15/2017   CL 106 10/15/2017   CO2 26 10/15/2017    Anesthesia Physical Anesthesia Plan  ASA: II  Anesthesia Plan: Spinal   Post-op Pain Management:  [facility] for Post-op pain   Induction:   PONV Risk Score and Plan: Treatment may vary due to age or medical condition  Airway Management Planned: Mask, Natural Airway and Awake Intubation Planned  Additional Equipment:   Intra-op Plan:   Post-operative Plan:   Informed Consent: I have reviewed the patients History and Physical, chart, labs and discussed the procedure including the risks, benefits and alternatives for the proposed  anesthesia with the patient or authorized representative who has indicated his/her understanding and acceptance.     Plan Discussed with: CRNA  Anesthesia Plan Comments:         Anesthesia Quick Evaluation

## 2017-10-19 ENCOUNTER — Inpatient Hospital Stay (HOSPITAL_COMMUNITY): Payer: Medicare Other

## 2017-10-19 ENCOUNTER — Encounter (HOSPITAL_COMMUNITY): Payer: Self-pay | Admitting: Certified Registered Nurse Anesthetist

## 2017-10-19 ENCOUNTER — Inpatient Hospital Stay (HOSPITAL_COMMUNITY): Payer: Medicare Other | Admitting: Certified Registered Nurse Anesthetist

## 2017-10-19 ENCOUNTER — Inpatient Hospital Stay (HOSPITAL_COMMUNITY)
Admission: RE | Admit: 2017-10-19 | Discharge: 2017-10-22 | DRG: 470 | Disposition: A | Discharge status: Skilled Nursing Facility | Payer: Medicare Other | Source: Ambulatory Visit | Attending: Orthopaedic Surgery | Admitting: Orthopaedic Surgery

## 2017-10-19 ENCOUNTER — Other Ambulatory Visit: Payer: Self-pay

## 2017-10-19 ENCOUNTER — Encounter (HOSPITAL_COMMUNITY)
Admission: RE | Disposition: A | Discharge status: Skilled Nursing Facility | Payer: Self-pay | Source: Ambulatory Visit | Attending: Orthopaedic Surgery

## 2017-10-19 DIAGNOSIS — Z8262 Family history of osteoporosis: Secondary | ICD-10-CM

## 2017-10-19 DIAGNOSIS — K219 Gastro-esophageal reflux disease without esophagitis: Secondary | ICD-10-CM | POA: Diagnosis present

## 2017-10-19 DIAGNOSIS — Z880 Allergy status to penicillin: Secondary | ICD-10-CM

## 2017-10-19 DIAGNOSIS — D62 Acute posthemorrhagic anemia: Secondary | ICD-10-CM | POA: Diagnosis present

## 2017-10-19 DIAGNOSIS — Z96642 Presence of left artificial hip joint: Secondary | ICD-10-CM

## 2017-10-19 DIAGNOSIS — Z803 Family history of malignant neoplasm of breast: Secondary | ICD-10-CM | POA: Diagnosis not present

## 2017-10-19 DIAGNOSIS — M1612 Unilateral primary osteoarthritis, left hip: Principal | ICD-10-CM | POA: Diagnosis present

## 2017-10-19 DIAGNOSIS — Z8249 Family history of ischemic heart disease and other diseases of the circulatory system: Secondary | ICD-10-CM

## 2017-10-19 DIAGNOSIS — Z09 Encounter for follow-up examination after completed treatment for conditions other than malignant neoplasm: Secondary | ICD-10-CM

## 2017-10-19 HISTORY — PX: TOTAL HIP ARTHROPLASTY: SHX124

## 2017-10-19 LAB — TYPE AND SCREEN
ABO/RH(D): O NEG
Antibody Screen: NEGATIVE

## 2017-10-19 SURGERY — ARTHROPLASTY, HIP, TOTAL, ANTERIOR APPROACH
Anesthesia: Spinal | Site: Hip | Laterality: Left

## 2017-10-19 MED ORDER — PROPOFOL 10 MG/ML IV BOLUS
INTRAVENOUS | Status: AC
  Filled 2017-10-19: qty 20

## 2017-10-19 MED ORDER — PROPOFOL 500 MG/50ML IV EMUL
INTRAVENOUS | Status: DC | PRN
  Administered 2017-10-19: 50 ug/kg/min via INTRAVENOUS

## 2017-10-19 MED ORDER — MENTHOL 3 MG MT LOZG: 1.0000 | LOZENGE | OROMUCOSAL | Status: DC | PRN

## 2017-10-19 MED ORDER — MEPERIDINE HCL 50 MG/ML IJ SOLN: 6.2500 mg | INTRAMUSCULAR | Status: DC | PRN

## 2017-10-19 MED ORDER — MIDAZOLAM HCL 5 MG/5ML IJ SOLN
INTRAMUSCULAR | Status: DC | PRN
  Administered 2017-10-19: 1 mg via INTRAVENOUS

## 2017-10-19 MED ORDER — MIDAZOLAM HCL 2 MG/2ML IJ SOLN
INTRAMUSCULAR | Status: AC
  Filled 2017-10-19: qty 2

## 2017-10-19 MED ORDER — PROPOFOL 10 MG/ML IV BOLUS
INTRAVENOUS | Status: AC
  Filled 2017-10-19: qty 40

## 2017-10-19 MED ORDER — HYDROCODONE-ACETAMINOPHEN 5-325 MG PO TABS: 1.0000 | ORAL_TABLET | ORAL | Status: DC | PRN

## 2017-10-19 MED ORDER — ASPIRIN 81 MG PO CHEW
81.0000 mg | CHEWABLE_TABLET | Freq: Two times a day (BID) | ORAL | Status: DC
  Administered 2017-10-19 – 2017-10-22 (×6): 81 mg via ORAL
  Filled 2017-10-19 (×6): qty 1

## 2017-10-19 MED ORDER — METHOCARBAMOL 1000 MG/10ML IJ SOLN
500.0000 mg | Freq: Four times a day (QID) | INTRAVENOUS | Status: DC | PRN
  Administered 2017-10-19: 500 mg via INTRAVENOUS
  Filled 2017-10-19: qty 550

## 2017-10-19 MED ORDER — ONDANSETRON HCL 4 MG/2ML IJ SOLN
INTRAMUSCULAR | Status: AC
  Filled 2017-10-19: qty 2

## 2017-10-19 MED ORDER — DOCUSATE SODIUM 100 MG PO CAPS
100.0000 mg | ORAL_CAPSULE | Freq: Two times a day (BID) | ORAL | Status: DC
  Administered 2017-10-19 – 2017-10-22 (×6): 100 mg via ORAL
  Filled 2017-10-19 (×6): qty 1

## 2017-10-19 MED ORDER — ACETAMINOPHEN 325 MG PO TABS
325.0000 mg | ORAL_TABLET | Freq: Four times a day (QID) | ORAL | Status: DC | PRN
  Administered 2017-10-19: 325 mg via ORAL
  Filled 2017-10-19: qty 2

## 2017-10-19 MED ORDER — FENTANYL CITRATE (PF) 100 MCG/2ML IJ SOLN
INTRAMUSCULAR | Status: AC
  Filled 2017-10-19: qty 2

## 2017-10-19 MED ORDER — ONDANSETRON HCL 4 MG/2ML IJ SOLN
INTRAMUSCULAR | Status: DC | PRN
  Administered 2017-10-19: 4 mg via INTRAVENOUS

## 2017-10-19 MED ORDER — METHOCARBAMOL 500 MG PO TABS
500.0000 mg | ORAL_TABLET | Freq: Four times a day (QID) | ORAL | Status: DC | PRN
  Administered 2017-10-21: 500 mg via ORAL
  Administered 2017-10-22: 250 mg via ORAL
  Filled 2017-10-19 (×2): qty 1

## 2017-10-19 MED ORDER — PHENYLEPHRINE HCL 10 MG/ML IJ SOLN
INTRAMUSCULAR | Status: AC
  Filled 2017-10-19: qty 1

## 2017-10-19 MED ORDER — LACTATED RINGERS IV SOLN
INTRAVENOUS | Status: DC
  Administered 2017-10-19 (×2): via INTRAVENOUS

## 2017-10-19 MED ORDER — PROMETHAZINE HCL 25 MG/ML IJ SOLN: 6.2500 mg | INTRAMUSCULAR | Status: DC | PRN

## 2017-10-19 MED ORDER — POLYETHYLENE GLYCOL 3350 17 G PO PACK: 17.0000 g | PACK | Freq: Every day | ORAL | Status: DC | PRN

## 2017-10-19 MED ORDER — ONDANSETRON HCL 4 MG PO TABS
4.0000 mg | ORAL_TABLET | Freq: Four times a day (QID) | ORAL | Status: DC | PRN
Start: 2017-10-19 — End: 2017-10-22

## 2017-10-19 MED ORDER — CHLORHEXIDINE GLUCONATE 4 % EX LIQD: 60.0000 mL | Freq: Once | CUTANEOUS | Status: DC

## 2017-10-19 MED ORDER — BUPIVACAINE IN DEXTROSE 0.75-8.25 % IT SOLN
INTRATHECAL | Status: DC | PRN
  Administered 2017-10-19: 2 mL via INTRATHECAL

## 2017-10-19 MED ORDER — SODIUM CHLORIDE 0.9 % IV SOLN
INTRAVENOUS | Status: DC
  Administered 2017-10-19 – 2017-10-20 (×2): via INTRAVENOUS

## 2017-10-19 MED ORDER — CLINDAMYCIN PHOSPHATE 600 MG/50ML IV SOLN
600.0000 mg | Freq: Four times a day (QID) | INTRAVENOUS | Status: AC
  Administered 2017-10-19 (×2): 600 mg via INTRAVENOUS
  Filled 2017-10-19 (×2): qty 50

## 2017-10-19 MED ORDER — PHENOL 1.4 % MT LIQD: 1.0000 | OROMUCOSAL | Status: DC | PRN

## 2017-10-19 MED ORDER — SODIUM CHLORIDE 0.9 % IR SOLN
Status: DC | PRN
  Administered 2017-10-19: 1000 mL

## 2017-10-19 MED ORDER — FENTANYL CITRATE (PF) 100 MCG/2ML IJ SOLN
INTRAMUSCULAR | Status: DC | PRN
  Administered 2017-10-19: 50 ug via INTRAVENOUS

## 2017-10-19 MED ORDER — ALUM & MAG HYDROXIDE-SIMETH 200-200-20 MG/5ML PO SUSP: 30.0000 mL | ORAL | Status: DC | PRN

## 2017-10-19 MED ORDER — HYDROCODONE-ACETAMINOPHEN 7.5-325 MG PO TABS: 1.0000 | ORAL_TABLET | Freq: Once | ORAL | Status: DC | PRN

## 2017-10-19 MED ORDER — HYDROCODONE-ACETAMINOPHEN 7.5-325 MG PO TABS: 1.0000 | ORAL_TABLET | ORAL | Status: DC | PRN

## 2017-10-19 MED ORDER — HYDROMORPHONE HCL 1 MG/ML IJ SOLN
INTRAMUSCULAR | Status: AC
  Filled 2017-10-19: qty 2

## 2017-10-19 MED ORDER — METOCLOPRAMIDE HCL 5 MG PO TABS
5.0000 mg | ORAL_TABLET | Freq: Three times a day (TID) | ORAL | Status: DC | PRN
Start: 2017-10-19 — End: 2017-10-22

## 2017-10-19 MED ORDER — EPHEDRINE SULFATE-NACL 50-0.9 MG/10ML-% IV SOSY
PREFILLED_SYRINGE | INTRAVENOUS | Status: DC | PRN
  Administered 2017-10-19 (×2): 10 mg via INTRAVENOUS

## 2017-10-19 MED ORDER — TRAMADOL HCL 50 MG PO TABS
50.0000 mg | ORAL_TABLET | Freq: Four times a day (QID) | ORAL | Status: DC
  Administered 2017-10-19 – 2017-10-22 (×12): 50 mg via ORAL
  Filled 2017-10-19 (×12): qty 1

## 2017-10-19 MED ORDER — EPHEDRINE 5 MG/ML INJ
INTRAVENOUS | Status: AC
  Filled 2017-10-19: qty 10

## 2017-10-19 MED ORDER — ONDANSETRON HCL 4 MG/2ML IJ SOLN: 4.0000 mg | Freq: Four times a day (QID) | INTRAMUSCULAR | Status: DC | PRN

## 2017-10-19 MED ORDER — HYDROMORPHONE HCL 1 MG/ML IJ SOLN
0.2500 mg | INTRAMUSCULAR | Status: DC | PRN
  Administered 2017-10-19 (×2): 0.5 mg via INTRAVENOUS

## 2017-10-19 MED ORDER — METOCLOPRAMIDE HCL 5 MG/ML IJ SOLN: 5.0000 mg | Freq: Three times a day (TID) | INTRAMUSCULAR | Status: DC | PRN

## 2017-10-19 MED ORDER — DIPHENHYDRAMINE HCL 12.5 MG/5ML PO ELIX: 12.5000 mg | ORAL_SOLUTION | ORAL | Status: DC | PRN

## 2017-10-19 MED ORDER — ACETAMINOPHEN 10 MG/ML IV SOLN: 1000.0000 mg | Freq: Once | INTRAVENOUS | Status: DC | PRN

## 2017-10-19 MED ORDER — SODIUM CHLORIDE 0.9 % IV SOLN
INTRAVENOUS | Status: DC | PRN
  Administered 2017-10-19: 25 ug/min via INTRAVENOUS

## 2017-10-19 MED ORDER — MORPHINE SULFATE (PF) 2 MG/ML IV SOLN: 0.5000 mg | INTRAVENOUS | Status: DC | PRN

## 2017-10-19 MED ORDER — CLINDAMYCIN PHOSPHATE 900 MG/50ML IV SOLN
900.0000 mg | INTRAVENOUS | Status: AC
  Administered 2017-10-19: 900 mg via INTRAVENOUS
  Filled 2017-10-19: qty 50

## 2017-10-19 MED ORDER — PANTOPRAZOLE SODIUM 40 MG PO TBEC
40.0000 mg | DELAYED_RELEASE_TABLET | Freq: Every day | ORAL | Status: DC
  Administered 2017-10-19 – 2017-10-22 (×4): 40 mg via ORAL
  Filled 2017-10-19 (×4): qty 1

## 2017-10-19 SURGICAL SUPPLY — 35 items
BAG ZIPLOCK 12X15 (MISCELLANEOUS) ×3 IMPLANT
BENZOIN TINCTURE PRP APPL 2/3 (GAUZE/BANDAGES/DRESSINGS) IMPLANT
BLADE SAW SGTL 18X1.27X75 (BLADE) ×2 IMPLANT
BLADE SAW SGTL 18X1.27X75MM (BLADE) ×1
CAPT HIP TOTAL 2 ×3 IMPLANT
CLOSURE WOUND 1/2 X4 (GAUZE/BANDAGES/DRESSINGS)
COVER PERINEAL POST (MISCELLANEOUS) ×3 IMPLANT
COVER SURGICAL LIGHT HANDLE (MISCELLANEOUS) ×3 IMPLANT
DRAPE STERI IOBAN 125X83 (DRAPES) ×3 IMPLANT
DRAPE TOP SHEET (DRAPES) ×3 IMPLANT
DRAPE U-SHAPE 47X51 STRL (DRAPES) ×6 IMPLANT
DRSG AQUACEL AG ADV 3.5X10 (GAUZE/BANDAGES/DRESSINGS) ×3 IMPLANT
DURAPREP 26ML APPLICATOR (WOUND CARE) ×3 IMPLANT
ELECT REM PT RETURN 15FT ADLT (MISCELLANEOUS) ×3 IMPLANT
GAUZE XEROFORM 1X8 LF (GAUZE/BANDAGES/DRESSINGS) ×3 IMPLANT
GLOVE BIO SURGEON STRL SZ7.5 (GLOVE) ×3 IMPLANT
GLOVE BIOGEL PI IND STRL 8 (GLOVE) ×2 IMPLANT
GLOVE BIOGEL PI INDICATOR 8 (GLOVE) ×4
GLOVE ECLIPSE 8.0 STRL XLNG CF (GLOVE) ×3 IMPLANT
GOWN STRL REUS W/TWL XL LVL3 (GOWN DISPOSABLE) ×6 IMPLANT
HANDPIECE INTERPULSE COAX TIP (DISPOSABLE) ×2
HOLDER FOLEY CATH W/STRAP (MISCELLANEOUS) ×3 IMPLANT
PACK ANTERIOR HIP CUSTOM (KITS) ×3 IMPLANT
SET HNDPC FAN SPRY TIP SCT (DISPOSABLE) ×1 IMPLANT
STAPLER VISISTAT 35W (STAPLE) ×3 IMPLANT
STRIP CLOSURE SKIN 1/2X4 (GAUZE/BANDAGES/DRESSINGS) IMPLANT
SUT ETHIBOND NAB CT1 #1 30IN (SUTURE) ×3 IMPLANT
SUT MNCRL AB 4-0 PS2 18 (SUTURE) IMPLANT
SUT VIC AB 0 CT1 36 (SUTURE) ×3 IMPLANT
SUT VIC AB 1 CT1 36 (SUTURE) ×3 IMPLANT
SUT VIC AB 2-0 CT1 27 (SUTURE) ×4
SUT VIC AB 2-0 CT1 TAPERPNT 27 (SUTURE) ×2 IMPLANT
TRAY FOLEY W/METER SILVER 14FR (SET/KITS/TRAYS/PACK) ×3 IMPLANT
TRAY FOLEY W/METER SILVER 16FR (SET/KITS/TRAYS/PACK) IMPLANT
YANKAUER SUCT BULB TIP 10FT TU (MISCELLANEOUS) ×3 IMPLANT

## 2017-10-19 NOTE — H&P (Signed)
TOTAL HIP ADMISSION H&P  Patient is admitted for left total hip arthroplasty.  Subjective:  Chief Complaint: left hip pain  HPI: Veronica Stanley, 72 y.o. female, has a history of pain and functional disability in the left hip(s) due to arthritis and patient has failed non-surgical conservative treatments for greater than 12 weeks to include NSAID's and/or analgesics, corticosteriod injections and activity modification.  Onset of symptoms was gradual starting 3 years ago with gradually worsening course since that time.The patient noted no past surgery on the left hip(s).  Patient currently rates pain in the left hip at 10 out of 10 with activity. Patient has night pain, worsening of pain with activity and weight bearing, pain that interfers with activities of daily living, pain with passive range of motion and crepitus. Patient has evidence of subchondral cysts, subchondral sclerosis, periarticular osteophytes and joint space narrowing by imaging studies. This condition presents safety issues increasing the risk of falls.  There is no current active infection.  Patient Active Problem List   Diagnosis Date Noted  . Unilateral primary osteoarthritis, left hip 09/17/2017  . Pain in left hip 09/17/2017  . Osteoarthritis of both hips 11/10/2015  . Advance directive discussed with patient 11/03/2014  . Routine general medical examination at a health care facility 03/03/2011  . Osteoarthritis, multiple sites   . GERD 10/11/2007  . ACTINIC KERATOSIS 04/17/2007  . Essential hypertension, benign 03/28/2007  . DIVERTICULOSIS, COLON 03/28/2007  . OSTEOPOROSIS 03/28/2007   Past Medical History:  Diagnosis Date  . Basal cell carcinoma, arm 11/13   on arm and face   . Diverticulitis    colon  . GERD (gastroesophageal reflux disease)   . Hypertension    patient denies not on meds   . Menopausal symptoms   . Osteoarthrosis involving, or with mention of more than one site, but not specified as  generalized, multiple sites   . Osteoporosis     Past Surgical History:  Procedure Laterality Date  . ABDOMINAL HYSTERECTOMY    . BREAST BIOPSY Left 2003   Left breast calcifications removed  . DILATION AND CURETTAGE OF UTERUS     rupture- peritonitis & hysterectomy 12/79  . VAGINAL DELIVERY     x2    Current Facility-Administered Medications  Medication Dose Route Frequency Provider Last Rate Last Dose  . chlorhexidine (HIBICLENS) 4 % liquid 4 application  60 mL Topical Once Erskine Emery W, PA-C      . clindamycin (CLEOCIN) IVPB 900 mg  900 mg Intravenous On Call to OR Pete Pelt, PA-C      . lactated ringers infusion   Intravenous Continuous Janeece Riggers, MD 20 mL/hr at 10/19/17 0708    . tranexamic acid (CYKLOKAPRON) 1,000 mg in sodium chloride 0.9 % 100 mL IVPB  1,000 mg Intravenous On Call to [place], Cindie Laroche, RPH       Allergies  Allergen Reactions  . Penicillins Anaphylaxis, Shortness Of Breath, Swelling and Rash    Has patient had a PCN reaction causing immediate rash, facial/tongue/throat swelling, SOB or lightheadedness with hypotension: Yes Has patient had a PCN reaction causing severe rash involving mucus membranes or skin necrosis: Yes Has patient had a PCN reaction that required hospitalization: No Has patient had a PCN reaction occurring within the last 10 years: No If all of the above answers are "NO", then may proceed with Cephalosporin use.   . Ibandronate Sodium Other (See Comments)    aches    Social History  Tobacco Use  . Smoking status: Never Smoker  . Smokeless tobacco: Never Used  Substance Use Topics  . Alcohol use: Yes    Comment: occasional    Family History  Problem Relation Age of Onset  . Osteoporosis Mother   . Coronary artery disease Father   . Breast cancer Sister 33  . Cancer Maternal Grandfather        colon cancer  . Diabetes Neg Hx   . Hypertension Neg Hx      Review of Systems  Musculoskeletal: Positive for joint  pain.  All other systems reviewed and are negative.   Objective:  Physical Exam  Constitutional: She is oriented to person, place, and time. She appears well-developed and well-nourished.  HENT:  Head: Normocephalic and atraumatic.  Eyes: Pupils are equal, round, and reactive to light. EOM are normal.  Neck: Normal range of motion. Neck supple.  Cardiovascular: Normal rate and regular rhythm.  Respiratory: Effort normal and breath sounds normal.  GI: Soft. Bowel sounds are normal.  Musculoskeletal:       Left hip: She exhibits decreased range of motion, decreased strength, tenderness and bony tenderness.  Neurological: She is alert and oriented to person, place, and time.  Skin: Skin is warm and dry.  Psychiatric: She has a normal mood and affect.    Vital signs in last 24 hours: Temp:  [98.3 F (36.8 C)] 98.3 F (36.8 C) (05/03 0639) Pulse Rate:  [81] 81 (05/03 0639) Resp:  [18] 18 (05/03 0639) BP: (141)/(89) 141/89 (05/03 0639) SpO2:  [98 %] 98 % (05/03 0639) Weight:  [227 lb (103 kg)] 227 lb (103 kg) (05/03 0701)  Labs:   Estimated body mass index is 35.55 kg/m as calculated from the following:   Height as of this encounter: 5\' 7"  (1.702 m).   Weight as of this encounter: 227 lb (103 kg).   Imaging Review Plain radiographs demonstrate severe degenerative joint disease of the left hip(s). The bone quality appears to be good for age and reported activity level.    Preoperative templating of the joint replacement has been completed, documented, and submitted to the Operating Room personnel in order to optimize intra-operative equipment managemen    Assessment/Plan:  End stage arthritis, left hip(s)  The patient history, physical examination, clinical judgement of the provider and imaging studies are consistent with end stage degenerative joint disease of the left hip(s) and total hip arthroplasty is deemed medically necessary. The treatment options including  medical management, injection therapy, arthroscopy and arthroplasty were discussed at length. The risks and benefits of total hip arthroplasty were presented and reviewed. The risks due to aseptic loosening, infection, stiffness, dislocation/subluxation,  thromboembolic complications and other imponderables were discussed.  The patient acknowledged the explanation, agreed to proceed with the plan and consent was signed. Patient is being admitted for inpatient treatment for surgery, pain control, PT, OT, prophylactic antibiotics, VTE prophylaxis, progressive ambulation and ADL's and discharge planning.The patient is planning to be discharged home with home health services

## 2017-10-19 NOTE — Evaluation (Signed)
Physical Therapy Evaluation Patient Details Name: Veronica Stanley MRN: [id number] DOB: [birth year] Today's Date: 10/19/2017   History of Present Illness  Pt s/p L THR   Clinical Impression  Pt s/p L THR and presents with decreased L LE strength/ROM and post op pain limiting functional mobility.  Pt would benefit from follow up rehab at SNF level to maximize IND and safety prior to return home with limited assist.    Follow Up Recommendations SNF    Equipment Recommendations  None recommended by PT    Recommendations for Other Services       Precautions / Restrictions Precautions Precautions: Fall Restrictions Weight Bearing Restrictions: No Other Position/Activity Restrictions: WBAT      Mobility  Bed Mobility Overal bed mobility: Needs Assistance Bed Mobility: Supine to Sit     Supine to sit: Min assist;Mod assist     General bed mobility comments: cues for sequence, posture and position from RW  Transfers Overall transfer level: Needs assistance Equipment used: Rolling walker (2 wheeled) Transfers: Sit to/from Stand Sit to Stand: Min assist;Mod assist         General transfer comment: cues for LE management and use of UEs to self assist  Ambulation/Gait Ambulation/Gait assistance: Min assist Ambulation Distance (Feet): 22 Feet Assistive device: Rolling walker (2 wheeled) Gait Pattern/deviations: Step-to pattern;Decreased step length - right;Decreased step length - left;Shuffle;Trunk flexed Gait velocity: decr   General Gait Details: cues for sequence, posture and position from [employer]            Wheelchair Mobility    Modified Rankin (Stroke Patients Only)       Balance                                             Pertinent Vitals/Pain Pain Assessment: 0-10 Pain Score: 3  Pain Location: L hip Pain Descriptors / Indicators: Aching;Sore Pain Intervention(s): Limited activity within patient's tolerance;Monitored during  session;Premedicated before session;Ice applied    Home Living Family/patient expects to be discharged to:: Skilled nursing facility Living Arrangements: Alone                    Prior Function Level of Independence: Independent;Independent with assistive device(s)               Hand Dominance        Extremity/Trunk Assessment   Upper Extremity Assessment Upper Extremity Assessment: Overall WFL for tasks assessed    Lower Extremity Assessment Lower Extremity Assessment: LLE deficits/detail       Communication   Communication: No difficulties  Cognition Arousal/Alertness: Awake/alert Behavior During Therapy: WFL for tasks assessed/performed Overall Cognitive Status: Within Functional Limits for tasks assessed                                        General Comments      Exercises     Assessment/Plan    PT Assessment Patient needs continued PT services  PT Problem List Decreased strength;Decreased range of motion;Decreased activity tolerance;Decreased mobility;Decreased knowledge of use of DME;Pain       PT Treatment Interventions DME instruction;Gait training;Stair training;Functional mobility training;Therapeutic activities;Therapeutic exercise;Patient/family education    PT Goals (Current goals can be found in the Care Plan section)  Acute Rehab  PT Goals Patient Stated Goal: Rehab and then home PT Goal Formulation: With patient Time For Goal Achievement: 10/26/17 Potential to Achieve Goals: Good    Frequency 7X/week   Barriers to discharge Decreased caregiver support Lives alone    Co-evaluation               AM-PAC PT "6 Clicks" Daily Activity  Outcome Measure Difficulty turning over in bed (including adjusting bedclothes, sheets and blankets)?: Unable Difficulty moving from lying on back to sitting on the side of the bed? : Unable Difficulty sitting down on and standing up from a chair with arms (e.g., wheelchair,  bedside commode, etc,.)?: Unable Help needed moving to and from a bed to chair (including a wheelchair)?: A Lot Help needed walking in hospital room?: A Little Help needed climbing 3-5 steps with a railing? : A Lot 6 Click Score: 10    End of Session Equipment Utilized During Treatment: Gait belt Activity Tolerance: Patient tolerated treatment well Patient left: in chair;with call bell/phone within reach;with family/visitor present Nurse Communication: Mobility status PT Visit Diagnosis: Difficulty in walking, not elsewhere classified (R26.2)    Time: 1610-1630 PT Time Calculation (min) (ACUTE ONLY): 20 min   Charges:   PT Evaluation $PT Eval Low Complexity: 1 Low     PT G Codes:        Pg [phone]   [place] Latulippe 10/19/2017, 5:57 PM

## 2017-10-19 NOTE — Anesthesia Procedure Notes (Signed)
Spinal  Patient location during procedure: OR Start time: 10/19/2017 8:49 AM End time: 10/19/2017 8:54 AM Staffing Anesthesiologist: Barnet Glasgow, MD Performed: anesthesiologist  Preanesthetic Checklist Completed: patient identified, surgical consent, pre-op evaluation, timeout performed, IV checked, risks and benefits discussed and monitors and equipment checked Spinal Block Patient position: sitting Prep: site prepped and draped and DuraPrep Patient monitoring: heart rate, cardiac monitor, continuous pulse ox and blood pressure Approach: midline Location: L3-4 Injection technique: single-shot Needle Needle type: Pencan  Needle gauge: 24 G Needle length: 10 cm Assessment Sensory level: T4

## 2017-10-19 NOTE — Brief Op Note (Signed)
10/19/2017  10:23 AM  PATIENT:  Veronica Stanley  72 y.o. female  PRE-OPERATIVE DIAGNOSIS:  osteoarthritis left hip  POST-OPERATIVE DIAGNOSIS:  osteoarthritis left hip  PROCEDURE:  Procedure(s): LEFT TOTAL HIP ARTHROPLASTY ANTERIOR APPROACH (Left)  SURGEON:  Surgeon(s) and Role:    Mcarthur Rossetti, MD - Primary  PHYSICIAN ASSISTANT: Benita Stabile, PA-C  ANESTHESIA:   spinal  EBL:  250 mL   COUNTS:  YES  DICTATION: .Other Dictation: Dictation Number [id number]  PLAN OF CARE: Admit to inpatient   PATIENT DISPOSITION:  PACU - hemodynamically stable.   Delay start of Pharmacological VTE agent (>24hrs) due to surgical blood loss or risk of bleeding: no

## 2017-10-19 NOTE — Transfer of Care (Signed)
Immediate Anesthesia Transfer of Care Note  Patient: Veronica Stanley  Procedure(s) Performed: LEFT TOTAL HIP ARTHROPLASTY ANTERIOR APPROACH (Left Hip)  Patient Location: PACU  Anesthesia Type:Spinal  Level of Consciousness: awake, alert  and oriented  Airway & Oxygen Therapy: Patient Spontanous Breathing and Patient connected to face mask oxygen  Post-op Assessment: Report given to RN and Post -op Vital signs reviewed and stable  Post vital signs: Reviewed and stable  Last Vitals:  Vitals Value Taken Time  BP 85/52 10/19/2017 10:37 AM  Temp    Pulse 72 10/19/2017 10:40 AM  Resp 16 10/19/2017 10:40 AM  SpO2 100 % 10/19/2017 10:40 AM  Vitals shown include unvalidated device data.  Last Pain:  Vitals:   10/19/17 0701  TempSrc:   PainSc: 2       Patients Stated Pain Goal: 4 (85/92/76 3943)  Complications: No apparent anesthesia complications

## 2017-10-19 NOTE — Anesthesia Postprocedure Evaluation (Signed)
Anesthesia Post Note  Patient: Preeti I Gish  Procedure(s) Performed: LEFT TOTAL HIP ARTHROPLASTY ANTERIOR APPROACH (Left Hip)     Patient location during evaluation: PACU Anesthesia Type: Spinal Level of consciousness: oriented and awake and alert Pain management: pain level controlled Vital Signs Assessment: post-procedure vital signs reviewed and stable Respiratory status: spontaneous breathing, respiratory function stable and patient connected to nasal cannula oxygen Cardiovascular status: blood pressure returned to baseline and stable Postop Assessment: no headache, no backache and no apparent nausea or vomiting Anesthetic complications: no    Last Vitals:  Vitals:   10/19/17 1230 10/19/17 1311  BP: 122/82 128/78  Pulse: 68 71  Resp: 16   Temp: 36.4 C 36.8 C  SpO2: 99% 100%    Last Pain:  Vitals:   10/19/17 1311  TempSrc: Oral  PainSc:                  Barnet Glasgow

## 2017-10-19 NOTE — Op Note (Signed)
NAME: [place], [place] RECORD AY:[id number] ACCOUNT [id number] DATE OF BIRTH:18-Jul-1945 FACILITY: [facility] LOCATION: [facility] PHYSICIAN:CHRISTOPHER Kerry Fort, MD  OPERATIVE REPORT  DATE OF PROCEDURE:  10/19/2017  PREOPERATIVE DIAGNOSES: 1.  Primary osteoarthritis. 2.  Degenerative joint disease, left hip.  POSTOPERATIVE DIAGNOSES:   1.  Primary osteoarthritis. 2.  Degenerative joint disease, left hip.  PROCEDURE:  Left total hip arthroplasty through direct anterior approach.  IMPLANTS:  DePuy Sector Gription acetabular component size 52, size 36+0 polyethylene liner, size 13 Corail femoral component with standard offset, size 36+1.5 ceramic head ball.  SURGEON:  Lind Guest. Ninfa Linden, MD  ASSISTANT:  Erskine Emery, PA-C  ANESTHESIA:  Spinal.  ANTIBIOTICS:  Two grams IV Ancef.  ESTIMATED BLOOD LOSS:  250 mL.  COMPLICATIONS:  None.  INDICATIONS:  The patient is a very pleasant 72 year old female well known to me.  She has debilitating arthritis and degenerative joint disease of her left hip.  This has been well documented.  At this point, she has tried and failed all forms of  conservative treatment and does wish to proceed with a total hip arthroplasty.  She understands fully the risk of acute blood loss anemia, nerve or vessel injury, fracture, infection, dislocation, DVT.  She understands our goals are to decrease pain,  improve mobility, and overall improve quality of life.  DESCRIPTION OF PROCEDURE:  After informed consent was obtained, the appropriate left hip was marked.  She was brought to the operating room where spinal anesthesia was obtained while she was on her stretcher.  A Foley catheter was placed, and then both  feet were placed in traction boots with in-line skeletal traction with no traction applied.  She was placed supine on the Hana fracture table with a perineal post in place, and again the feet were in skeletal traction device with no traction applied.    The left operative hip was prepped and draped with DuraPrep and sterile drapes.  A time-out was called to identify correct patient and correct left hip.  We then made an incision just inferior and posterior to the anterior and superior iliac spine and  carried this obliquely down the leg.  We dissected down the tensor fascia lata muscle, and tensor fascia was then divided longitudinally to proceed with a direct anterior approach to the hip.  We identified and cauterized the circumflex vessels.  Then,  identified the hip capsule.  Opened up the hip capsule in a tight format, finding moderate joint effusion and significant arthritis throughout her left hip.  We then placed Corail retractors on the medial and lateral femoral neck and made a femoral neck  cut with an oscillating saw just proximal to the lesser trochanter and completed this with an osteotome.  We placed a corkscrew down the femoral head and removed the femoral head in its entirety and found it to be completely devoid of cartilage.  We then  placed a bent Hohmann over the medial acetabular rim and removed periarticular osteophytes around the acetabulum and the acetabular labrum.  We then began reaming under direct visualization from a size 44 reamer in step-wise increments up to a size 51  with all reamers under direct visualization, the last reamer under direct fluoroscopy so we could attain our depth in reaming and our inclination and anteversion.  Once we were pleased with this, we placed the real DePuy Sector Gription acetabular  component size 52 and then a 36+0 polyethylene liner for that size acetabular component.  Attention was then turned  to the femur.  With the leg externally rotated to 120 degrees, extended and adducted, we were able to place a Mueller retractor medially  and a Hohmann retractor around the greater trochanter.  We released the lateral joint capsule and used a box-cutting osteotome in the femoral canal and a rongeur to  lateralize and began broaching from a size 8 broach, using the Corail broaching system up  to a size 13 with the 13 in place to trial the standard offset femoral neck and 36+1.5 hip ball.  We reduced into the acetabulum under direct fluoroscopy.  We assessed as well under direct clinical exam, and we were pleased with leg length, offset,  range of motion, and stability.  We then dislocated the hip and removed the trial components.  We placed the real Corail femoral component with standard offset and the real 36+1.5 ceramic hip ball and reduced this in the acetabulum, and it was stable.   We then irrigated soft tissue with normal saline solution using pulsatile lavage.  We closed the joint capsule with interrupted #1 Ethibond suture, followed by running #1 Vicryl in the tensor fascia, 0 Vicryl in the deep tissue, 2-0 Vicryl in the  subcutaneous tissue, and interrupted staples on the skin.  Xeroform and Aquacel dressing was applied.  She was taken off the Hana table and taken to recovery room in stable condition.  All final counts were correct.  There were no complications noted.   Of note, Erskine Emery, PA-C, assisted in the entire case.  His assistance was crucial for facilitating all aspects of this case.  LN/NUANCE  D:10/19/2017 T:10/19/2017 JOB:[id number]

## 2017-10-20 LAB — BASIC METABOLIC PANEL
ANION GAP: 7 (ref 5–15)
BUN: 14 mg/dL (ref 6–20)
CALCIUM: 8.4 mg/dL — AB (ref 8.9–10.3)
CHLORIDE: 102 mmol/L (ref 101–111)
CO2: 25 mmol/L (ref 22–32)
Creatinine, Ser: 0.64 mg/dL (ref 0.44–1.00)
GFR calc non Af Amer: >60 mL/min (ref >60)
GLUCOSE: 130 mg/dL — AB (ref 65–99)
Potassium: 4.6 mmol/L (ref 3.5–5.1)
Sodium: 134 mmol/L — ABNORMAL LOW (ref 135–145)

## 2017-10-20 LAB — CBC
HEMATOCRIT: 31.8 % — AB (ref 36.0–46.0)
Hemoglobin: 9.9 g/dL — ABNORMAL LOW (ref 12.0–15.0)
MCH: 29 pg (ref 26.0–34.0)
MCHC: 31.1 g/dL (ref 30.0–36.0)
MCV: 93.3 fL (ref 78.0–100.0)
Platelets: 199 10*3/uL (ref 150–400)
RBC: 3.41 MIL/uL — ABNORMAL LOW (ref 3.87–5.11)
RDW: 13.8 % (ref 11.5–15.5)
WBC: 7.3 10*3/uL (ref 4.0–10.5)

## 2017-10-20 MED ORDER — HYDROCODONE-ACETAMINOPHEN 5-325 MG PO TABS: 1.0000 | ORAL_TABLET | Freq: Four times a day (QID) | ORAL | 0 refills | Status: DC | PRN

## 2017-10-20 MED ORDER — ASPIRIN EC 81 MG PO TBEC: 81.0000 mg | DELAYED_RELEASE_TABLET | Freq: Two times a day (BID) | ORAL | 0 refills | Status: DC

## 2017-10-20 MED ORDER — ONDANSETRON HCL 4 MG PO TABS: 4.0000 mg | ORAL_TABLET | Freq: Three times a day (TID) | ORAL | 0 refills | Status: DC | PRN

## 2017-10-20 NOTE — Discharge Summary (Signed)
Patient ID: Veronica Stanley MRN: [id number] DOB/AGE: 1945/12/26 72 y.o.  Admit date: 10/19/2017 Discharge date: 10/20/2017  Admission Diagnoses:  Principal Problem:   Unilateral primary osteoarthritis, left hip Active Problems:   Status post total replacement of left hip   Discharge Diagnoses:  Same  Past Medical History:  Diagnosis Date  . Basal cell carcinoma, arm 11/13   on arm and face   . Diverticulitis    colon  . GERD (gastroesophageal reflux disease)   . Hypertension    patient denies not on meds   . Menopausal symptoms   . Osteoarthrosis involving, or with mention of more than one site, but not specified as generalized, multiple sites   . Osteoporosis     Surgeries: Procedure(s): LEFT TOTAL HIP ARTHROPLASTY ANTERIOR APPROACH on 10/19/2017   Consultants:   Discharged Condition: Improved  Hospital Course: Veronica Stanley is an 72 y.o. female who was admitted 10/19/2017 for operative treatment ofUnilateral primary osteoarthritis, left hip. Patient has severe unremitting pain that affects sleep, daily activities, and work/hobbies. After pre-op clearance the patient was taken to the operating room on 10/19/2017 and underwent  Procedure(s): LEFT TOTAL HIP ARTHROPLASTY ANTERIOR APPROACH.    Patient was given perioperative antibiotics:  Anti-infectives (From admission, onward)   Start     Dose/Rate Route Frequency Ordered Stop   10/19/17 1500  clindamycin (CLEOCIN) IVPB 600 mg     600 mg 100 mL/hr over 30 Minutes Intravenous Every 6 hours 10/19/17 1240 10/19/17 2119   10/19/17 0640  clindamycin (CLEOCIN) IVPB 900 mg     900 mg 100 mL/hr over 30 Minutes Intravenous On call to O.R. 10/19/17 1308 10/19/17 6578       Patient was given sequential compression devices, early ambulation, and chemoprophylaxis to prevent DVT.  Patient benefited maximally from hospital stay and there were no complications.    Recent vital signs:  Patient Vitals for the past 24 hrs:  BP Temp Temp src  Pulse Resp SpO2  10/20/17 0534 118/66 98 F (36.7 C) Oral 84 11 94 %  10/20/17 0229 114/65 98.4 F (36.9 C) Oral 84 14 98 %  10/19/17 2257 99/69 (!) 97.5 F (36.4 C) Oral 82 12 97 %  10/19/17 1755 110/68 98.3 F (36.8 C) Oral 78 16 98 %  10/19/17 1515 111/67 97.7 F (36.5 C) Oral 67 16 100 %  10/19/17 1412 108/69 (!) 97.5 F (36.4 C) Oral 70 - 100 %  10/19/17 1311 128/78 98.3 F (36.8 C) Oral 71 - 100 %  10/19/17 1230 122/82 97.6 F (36.4 C) Oral 68 16 99 %  10/19/17 1215 103/65 (!) 97.2 F (36.2 C) - 65 14 99 %  10/19/17 1200 98/62 - - 63 13 99 %  10/19/17 1145 98/60 - - (!) 58 12 100 %  10/19/17 1130 (!) 100/58 - - 65 15 99 %  10/19/17 1127 (!) 95/58 - - 67 13 97 %  10/19/17 1120 (!) 85/54 - - 64 10 98 %  10/19/17 1115 (!) 77/50 - - 70 12 98 %  10/19/17 1112 (!) 100/58 - - 72 12 99 %  10/19/17 1100 106/64 - - 79 16 100 %  10/19/17 1045 104/65 - - 74 18 100 %  10/19/17 1037 (!) 85/52 (!) 97.5 F (36.4 C) - - 14 100 %     Recent laboratory studies:  Recent Labs    10/20/17 0530  WBC 7.3  HGB 9.9*  HCT 31.8*  PLT 199  NA 134*  K 4.6  CL 102  CO2 25  BUN 14  CREATININE 0.64  GLUCOSE 130*  CALCIUM 8.4*     Discharge Medications:   Allergies as of 10/20/2017      Reactions   Penicillins Anaphylaxis, Shortness Of Breath, Swelling, Rash   Has patient had a PCN reaction causing immediate rash, facial/tongue/throat swelling, SOB or lightheadedness with hypotension: Yes Has patient had a PCN reaction causing severe rash involving mucus membranes or skin necrosis: Yes Has patient had a PCN reaction that required hospitalization: No Has patient had a PCN reaction occurring within the last 10 years: No If all of the above answers are "NO", then may proceed with Cephalosporin use.   Ibandronate Sodium Other (See Comments)   aches      Medication List    STOP taking these medications   acetaminophen 650 MG CR tablet Commonly known as:  TYLENOL   BC FAST PAIN  RELIEF 650-195-33.3 MG Pack Generic drug:  Aspirin-Salicylamide-Caffeine     TAKE these medications   aspirin EC 81 MG tablet Take 1 tablet (81 mg total) by mouth 2 (two) times daily.   HYDROcodone-acetaminophen 5-325 MG tablet Commonly known as:  NORCO Take 1-2 tablets by mouth every 6 (six) hours as needed for moderate pain.   omeprazole 20 MG capsule Commonly known as:  PRILOSEC TAKE ONE CAPSULE BY MOUTH DAILY What changed:    how much to take  how to take this  when to take this   ondansetron 4 MG tablet Commonly known as:  ZOFRAN Take 1 tablet (4 mg total) by mouth every 8 (eight) hours as needed for nausea or vomiting.            Durable Medical Equipment  (From admission, onward)        Start     Ordered   10/19/17 1241  DME 3 n 1  Once     10/19/17 1240   10/19/17 1241  DME Walker rolling  Once    Question:  Patient needs a walker to treat with the following condition  Answer:  Status post total replacement of left hip   10/19/17 1240      Diagnostic Studies: Dg Pelvis Portable  Result Date: 10/19/2017 CLINICAL DATA:  Total hip replacement. EXAM: PORTABLE PELVIS 1-2 VIEWS COMPARISON:  No prior. FINDINGS: Total left hip replacement. Hardware intact. No acute bony or joint abnormality. No evidence of fracture. IMPRESSION: Total left hip replacement. Hardware intact. No acute bony abnormality identified. Electronically Signed   By: Marcello Moores  Register   On: 10/19/2017 11:03   Dg C-arm 1-60 Min-no Report  Result Date: 10/19/2017 Fluoroscopy was utilized by the requesting physician.  No radiographic interpretation.   Dg Hip Operative Unilat W Or W/o Pelvis Left  Result Date: 10/19/2017 CLINICAL DATA:  Left hip prosthesis placement. EXAM: OPERATIVE LEFT HIP (WITH PELVIS IF PERFORMED) C-arm VIEWS TECHNIQUE: Fluoroscopic spot image(s) were submitted for interpretation post-operatively. COMPARISON:  None. FINDINGS: Left hip prosthesis in satisfactory position and  alignment. No fracture or dislocation seen. IMPRESSION: Satisfactory appearance of a left hip prosthesis. Electronically Signed   By: Claudie Revering M.D.   On: 10/19/2017 15:26    Disposition: Discharge disposition: 03-Skilled Nursing Facility            Signed: Aundra Dubin 10/20/2017, 9:47 AM

## 2017-10-20 NOTE — Progress Notes (Signed)
Subjective: 1 Day Post-Op Procedure(s) (LRB): LEFT TOTAL HIP ARTHROPLASTY ANTERIOR APPROACH (Left) Patient reports pain as mild.  No nausea/vomiting, lightheadedness/dizziness, chest pain/sob.  Objective: Vital signs in last 24 hours: Temp:  [97.2 F (36.2 C)-98.4 F (36.9 C)] 98 F (36.7 C) (05/04 0534) Pulse Rate:  [58-84] 84 (05/04 0534) Resp:  [10-18] 11 (05/04 0534) BP: (77-128)/(50-82) 118/66 (05/04 0534) SpO2:  [94 %-100 %] 94 % (05/04 0534)  Intake/Output from previous day: 05/03 0701 - 05/04 0700 In: 4008.4 [P.O.:1140; I.V.:2658.4; IV Piggyback:210] Out: 1191 [Urine:1575; Blood:250] Intake/Output this shift: Total I/O In: 240 [P.O.:240] Out: -   Recent Labs    10/20/17 0530  HGB 9.9*   Recent Labs    10/20/17 0530  WBC 7.3  RBC 3.41*  HCT 31.8*  PLT 199   Recent Labs    10/20/17 0530  NA 134*  K 4.6  CL 102  CO2 25  BUN 14  CREATININE 0.64  GLUCOSE 130*  CALCIUM 8.4*   No results for input(s): LABPT, INR in the last 72 hours.  Neurologically intact Neurovascular intact Sensation intact distally Intact pulses distally Dorsiflexion/Plantar flexion intact Incision: dressing C/D/I No cellulitis present Compartment soft    Assessment/Plan: 1 Day Post-Op Procedure(s) (LRB): LEFT TOTAL HIP ARTHROPLASTY ANTERIOR APPROACH (Left) Advance diet Up with therapy D/C IV fluids Discharge to SNF once approved by insurance (likely Sunday or Monday) WBAT LLE-anterior hip precautions ABLA-mild and stable    Aundra Dubin 10/20/2017, 9:43 AM

## 2017-10-20 NOTE — Discharge Instructions (Signed)

## 2017-10-20 NOTE — Clinical Social Work Note (Signed)
Clinical Social Work Assessment  Patient Details  Name: Veronica Stanley MRN: [id number] Date of Birth: 1946/04/09  Date of referral:  10/20/17               Reason for consult:  Facility Placement, Discharge Planning                Permission sought to share information with:  Family Supports Permission granted to share information::     Name::        Agency::  SNF  Relationship::  Daughter/ Son   Sport and exercise psychologist Information:     Housing/Transportation Living arrangements for the past 2 months:  [place] of Information:  Patient Patient Interpreter Needed:  None Criminal Activity/Legal Involvement Pertinent to Current Situation/Hospitalization:  No - Comment as needed Significant Relationships:  Adult Children Lives with:  Self Do you feel safe going back to the place where you live?  Yes Need for family participation in patient care:  Yes (Comment)  Care giving concerns:    Admitted for left total hip arthroplasty-PT recommends SNF placement.   Social Worker assessment / plan: CSW met with the patient and her son at bedside. Patient agreeable to SNF placement for short rehab to reagin strength before returning. Patient explain she is independent and "still able to move her lawn and do yard work." She reports when she started to " walk like a penguin" she decided it was time to have surgery on her left hip. Patient reports she prefers a SNF close to her home in [place], [place]. Patient provided CSW with SNF choices she is familiar with-[facility], [facility], [place]. Patient states "it has to be a Private Room." Patient reports she has also arranged for family aide and [place] services after rehab stay.  FL2 done.  Plan: SNF  Employment status:  Retired Forensic scientist:  Managed Care PT Recommendations:  [place] / Referral to community resources:  [place]  Patient/Family's  Response to care:  Agreeable and Responding well to care.  Patient/Family's Understanding of and Emotional Response to Diagnosis, Current Treatment, and Prognosis:  Patient alert and orientedx4 and has good understanding of diagnosis and follow up treatment plan.   Emotional Assessment Appearance:  Appears stated age Attitude/Demeanor/Rapport:    Affect (typically observed):  Accepting, Pleasant Orientation:  Oriented to Situation, Oriented to  Time, Oriented to Place, Oriented to Self Alcohol / Substance use:  Not Applicable Psych involvement (Current and /or in the community):  No (Comment)  Discharge Needs  Concerns to be addressed:  Discharge Planning Concerns Readmission within the last 30 days:  No Current discharge risk:  None Barriers to Discharge:  [place], [place] 10/20/2017, 10:19 AM

## 2017-10-20 NOTE — Progress Notes (Signed)
Physical Therapy Treatment Patient Details Name: Veronica Stanley MRN: [id number] DOB: May 15, 1946 Today's Date: 10/20/2017    History of Present Illness Pt s/p L THR     PT Comments    Pt in good spirits and progressing steadily with mobility.   Follow Up Recommendations  SNF     Equipment Recommendations  None recommended by PT    Recommendations for Other Services       Precautions / Restrictions Precautions Precautions: Fall Restrictions Weight Bearing Restrictions: No Other Position/Activity Restrictions: WBAT    Mobility  Bed Mobility               General bed mobility comments: Pt OOB in chair and request back to same  Transfers Overall transfer level: Needs assistance Equipment used: Rolling walker (2 wheeled) Transfers: Sit to/from Stand Sit to Stand: Min assist         General transfer comment: cues for LE management and use of UEs to self assist  Ambulation/Gait Ambulation/Gait assistance: Min assist Ambulation Distance (Feet): 90 Feet Assistive device: Rolling walker (2 wheeled) Gait Pattern/deviations: Step-to pattern;Step-through pattern;Decreased step length - right;Decreased step length - left;Shuffle;Trunk flexed Gait velocity: decr   General Gait Details: cues for sequence, posture and position from [employer]             Wheelchair Mobility    Modified Rankin (Stroke Patients Only)       Balance                                            Cognition Arousal/Alertness: Awake/alert Behavior During Therapy: WFL for tasks assessed/performed Overall Cognitive Status: Within Functional Limits for tasks assessed                                        Exercises Total Joint Exercises Ankle Circles/Pumps: AROM;Both;20 reps;Supine Quad Sets: AROM;Both;10 reps;Supine Heel Slides: AAROM;Left;20 reps;Supine Hip ABduction/ADduction: AAROM;Left;15 reps;Supine    General Comments         Pertinent Vitals/Pain Pain Assessment: 0-10 Pain Score: 3  Pain Location: L hip Pain Descriptors / Indicators: Aching;Sore Pain Intervention(s): Limited activity within patient's tolerance;Monitored during session;Premedicated before session;Ice applied    Home Living                      Prior Function            PT Goals (current goals can now be found in the care plan section) Acute Rehab PT Goals Patient Stated Goal: Rehab and then home PT Goal Formulation: With patient Time For Goal Achievement: 10/26/17 Potential to Achieve Goals: Good Progress towards PT goals: Progressing toward goals    Frequency    7X/week      PT Plan Current plan remains appropriate    Co-evaluation              AM-PAC PT "6 Clicks" Daily Activity  Outcome Measure  Difficulty turning over in bed (including adjusting bedclothes, sheets and blankets)?: Unable Difficulty moving from lying on back to sitting on the side of the bed? : Unable Difficulty sitting down on and standing up from a chair with arms (e.g., wheelchair, bedside commode, etc,.)?: Unable Help needed moving to and from a bed to chair (including  a wheelchair)?: A Lot Help needed walking in hospital room?: A Little Help needed climbing 3-5 steps with a railing? : A Lot 6 Click Score: 10    End of Session Equipment Utilized During Treatment: Gait belt Activity Tolerance: Patient tolerated treatment well Patient left: in chair;with call bell/phone within reach;with family/visitor present Nurse Communication: Mobility status PT Visit Diagnosis: Difficulty in walking, not elsewhere classified (R26.2)     Time: 0623-7628 PT Time Calculation (min) (ACUTE ONLY): 31 min  Charges:  $Gait Training: 8-22 mins $Therapeutic Exercise: 8-22 mins                    G Codes:       Pg [phone]    Emmauel Hallums 10/20/2017, 1:43 PM

## 2017-10-20 NOTE — Progress Notes (Signed)
Physical Therapy Treatment Patient Details Name: Veronica Stanley MRN: [id number] DOB: 1946/03/01 Today's Date: 10/20/2017    History of Present Illness Pt s/p L THR     PT Comments    Pt continues very cooperative but ltd this pm by fatigue.   Follow Up Recommendations  SNF     Equipment Recommendations  None recommended by PT    Recommendations for Other Services       Precautions / Restrictions Precautions Precautions: Fall Restrictions Weight Bearing Restrictions: No Other Position/Activity Restrictions: WBAT    Mobility  Bed Mobility               General bed mobility comments: Pt OOB in chair and request back to same  Transfers Overall transfer level: Needs assistance Equipment used: Rolling walker (2 wheeled) Transfers: Sit to/from Stand Sit to Stand: Min assist         General transfer comment: cues for LE management and use of UEs to self assist  Ambulation/Gait Ambulation/Gait assistance: Min assist Ambulation Distance (Feet): 83 Feet Assistive device: Rolling walker (2 wheeled) Gait Pattern/deviations: Step-to pattern;Step-through pattern;Decreased step length - right;Decreased step length - left;Shuffle;Trunk flexed Gait velocity: decr   General Gait Details: cues for sequence, posture and position from [employer]             Wheelchair Mobility    Modified Rankin (Stroke Patients Only)       Balance                                            Cognition Arousal/Alertness: Awake/alert Behavior During Therapy: WFL for tasks assessed/performed Overall Cognitive Status: Within Functional Limits for tasks assessed                                        Exercises Total Joint Exercises Ankle Circles/Pumps: AROM;Both;20 reps;Supine Quad Sets: AROM;Both;10 reps;Supine Heel Slides: AAROM;Left;20 reps;Supine Hip ABduction/ADduction: AAROM;Left;15 reps;Supine    General Comments         Pertinent Vitals/Pain Pain Assessment: 0-10 Pain Score: 3  Pain Location: L hip Pain Descriptors / Indicators: Tightness Pain Intervention(s): Limited activity within patient's tolerance;Monitored during session;Ice applied    Home Living                      Prior Function            PT Goals (current goals can now be found in the care plan section) Acute Rehab PT Goals Patient Stated Goal: Rehab and then home PT Goal Formulation: With patient Time For Goal Achievement: 10/26/17 Potential to Achieve Goals: Good Progress towards PT goals: Progressing toward goals    Frequency    7X/week      PT Plan Current plan remains appropriate    Co-evaluation              AM-PAC PT "6 Clicks" Daily Activity  Outcome Measure  Difficulty turning over in bed (including adjusting bedclothes, sheets and blankets)?: Unable Difficulty moving from lying on back to sitting on the side of the bed? : Unable Difficulty sitting down on and standing up from a chair with arms (e.g., wheelchair, bedside commode, etc,.)?: Unable Help needed moving to and from a bed to chair (including a  wheelchair)?: A Lot Help needed walking in hospital room?: A Little Help needed climbing 3-5 steps with a railing? : A Lot 6 Click Score: 10    End of Session Equipment Utilized During Treatment: Gait belt Activity Tolerance: Patient tolerated treatment well Patient left: in chair;with call bell/phone within reach;with family/visitor present Nurse Communication: Mobility status PT Visit Diagnosis: Difficulty in walking, not elsewhere classified (R26.2)     Time: 1510-1530 PT Time Calculation (min) (ACUTE ONLY): 20 min  Charges:  $Gait Training: 8-22 mins $Therapeutic Exercise: 8-22 mins                    G Codes:       Pg [phone]    Dominic Mahaney 10/20/2017, 3:59 PM

## 2017-10-20 NOTE — NC FL2 (Addendum)
[place] MEDICAID FL2 LEVEL OF CARE SCREENING TOOL     IDENTIFICATION  Patient Name: Veronica Stanley Birthdate: Oct 20, 1945 Sex: female Admission Date (Current Location): 10/19/2017  [facility] and [place] Number:  Herbalist and Address:  [facility],  [address], [place]      Provider Number: [id number]  Attending Physician Name and Address:  Mcarthur Rossetti  Relative Name and Phone Number:       Current Level of Care: Hospital Recommended Level of Care: [place] Prior Approval Number:    Date Approved/Denied:   PASRR Number:   [id number]  Discharge Plan: SNF    Current Diagnoses: Patient Active Problem List   Diagnosis Date Noted  . 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 Unilateral primary osteoarthritis, left hip 09/17/2017  . Pain in left hip 09/17/2017  . Osteoarthritis of both hips 11/10/2015  . Advance directive discussed with patient 11/03/2014  . Routine general medical examination at a health care facility 03/03/2011  . Osteoarthritis, multiple sites   . GERD 10/11/2007  . ACTINIC KERATOSIS 04/17/2007  . Essential hypertension, benign 03/28/2007  . DIVERTICULOSIS, COLON 03/28/2007  . OSTEOPOROSIS 03/28/2007    Orientation RESPIRATION BLADDER Height & Weight     Self, Time, Situation, Place  Normal Continent Weight: 227 lb (103 kg) Height:  5\' 7"  (170.2 cm)  BEHAVIORAL SYMPTOMS/MOOD NEUROLOGICAL BOWEL NUTRITION STATUS      Continent Diet(Heart Healthy )  AMBULATORY STATUS COMMUNICATION OF NEEDS Skin   Extensive Assist Verbally Surgical wounds(Left Hip)                       Personal Care Assistance Level of Assistance  Bathing, Feeding, Dressing Bathing Assistance: Limited assistance Feeding assistance: Independent Dressing Assistance: Limited assistance     Functional Limitations Info  Sight, Hearing, Speech Sight Info: Adequate Hearing Info: Adequate Speech  Info: Adequate    SPECIAL CARE FACTORS FREQUENCY  PT (By licensed PT), OT (By licensed OT)     PT Frequency: 7x/week  OT Frequency: 7x/week             Contractures Contractures Info: Not present    Additional Factors Info  Code Status, Allergies Code Status Info: Fullcode Allergies Info: Allergies: Penicillins, Ibandronate Sodium           Current Medications (10/20/2017):  This is the current hospital active medication list Current Facility-Administered Medications  Medication Dose Route Frequency Provider Last Rate Last Dose  . acetaminophen (TYLENOL) tablet 325-650 mg  325-650 mg Oral Q6H PRN Mcarthur Rossetti, MD   325 mg at 10/19/17 1315  . alum & mag hydroxide-simeth (MAALOX/MYLANTA) 200-200-20 MG/5ML suspension 30 mL  30 mL Oral Q4H PRN Mcarthur Rossetti, MD      . aspirin chewable tablet 81 mg  81 mg Oral BID Mcarthur Rossetti, MD   81 mg at 10/20/17 1062  . diphenhydrAMINE (BENADRYL) 12.5 MG/5ML elixir 12.5-25 mg  12.5-25 mg Oral Q4H PRN Mcarthur Rossetti, MD      . docusate sodium (COLACE) capsule 100 mg  100 mg Oral BID Mcarthur Rossetti, MD   100 mg at 10/20/17 6948  . HYDROcodone-acetaminophen (NORCO) 7.5-325 MG per tablet 1-2 tablet  1-2 tablet Oral Q4H PRN Mcarthur Rossetti, MD      . HYDROcodone-acetaminophen (NORCO/VICODIN) 5-325 MG per tablet 1-2 tablet  1-2 tablet Oral Q4H PRN Mcarthur Rossetti, MD      .  menthol-cetylpyridinium (CEPACOL) lozenge 3 mg  1 lozenge Oral PRN Mcarthur Rossetti, MD       Or  . phenol (CHLORASEPTIC) mouth spray 1 spray  1 spray Mouth/Throat PRN Mcarthur Rossetti, MD      . methocarbamol (ROBAXIN) tablet 500 mg  500 mg Oral Q6H PRN Mcarthur Rossetti, MD       Or  . methocarbamol (ROBAXIN) 500 mg in dextrose 5 % 50 mL IVPB  500 mg Intravenous Q6H PRN Mcarthur Rossetti, MD   Stopped at 10/19/17 1359  . metoCLOPramide (REGLAN) tablet 5-10 mg  5-10 mg Oral Q8H PRN  Mcarthur Rossetti, MD       Or  . metoCLOPramide (REGLAN) injection 5-10 mg  5-10 mg Intravenous Q8H PRN Mcarthur Rossetti, MD      . morphine 2 MG/ML injection 0.5-1 mg  0.5-1 mg Intravenous Q2H PRN Mcarthur Rossetti, MD      . ondansetron [facility]) tablet 4 mg  4 mg Oral Q6H PRN Mcarthur Rossetti, MD       Or  . ondansetron [facility]) injection 4 mg  4 mg Intravenous Q6H PRN Mcarthur Rossetti, MD      . pantoprazole (PROTONIX) EC tablet 40 mg  40 mg Oral Daily Mcarthur Rossetti, MD   40 mg at 10/20/17 5456  . polyethylene glycol (MIRALAX / GLYCOLAX) packet 17 g  17 g Oral Daily PRN Mcarthur Rossetti, MD      . traMADol [facility]at[facility]bon) tablet 50 mg  50 mg Oral Q6H Mcarthur Rossetti, MD   50 mg at 10/20/17 2563     Discharge Medications: Please see discharge summary for a list of discharge medications.  Relevant Imaging Results:  Relevant Lab Results:   Additional Information ssn:[id number]  Lia Hopping, LCSW

## 2017-10-21 LAB — CBC
HCT: 29.7 % — ABNORMAL LOW (ref 36.0–46.0)
Hemoglobin: 9.4 g/dL — ABNORMAL LOW (ref 12.0–15.0)
MCH: 29.4 pg (ref 26.0–34.0)
MCHC: 31.6 g/dL (ref 30.0–36.0)
MCV: 92.8 fL (ref 78.0–100.0)
PLATELETS: 203 10*3/uL (ref 150–400)
RBC: 3.2 MIL/uL — ABNORMAL LOW (ref 3.87–5.11)
RDW: 13.8 % (ref 11.5–15.5)
WBC: 8.8 10*3/uL (ref 4.0–10.5)

## 2017-10-21 NOTE — Progress Notes (Signed)
   10/21/17 1500  PT Visit Information  Last PT Received On 10/21/17; pt progressing well, continue plan for SNF therapies post acute  Assistance Needed +1  History of Present Illness Pt s/p L THR   Subjective Data  Patient Stated Goal Rehab and then home  Precautions  Precautions Fall  Restrictions  Weight Bearing Restrictions No  Other Position/Activity Restrictions WBAT  Pain Assessment  Pain Assessment No/denies pain  Cognition  Arousal/Alertness Awake/alert  Behavior During Therapy WFL for tasks assessed/performed  Overall Cognitive Status Within Functional Limits for tasks assessed  Bed Mobility  General bed mobility comments in chair  Transfers  Overall transfer level Needs assistance  Equipment used Rolling walker (2 wheeled)  Transfers Sit to/from Stand  Sit to Stand Min assist;Min guard  General transfer comment cues for hand placement, light assist to rise and transition to RW  Ambulation/Gait  Ambulation/Gait assistance Min guard  Ambulation Distance (Feet) 98 Feet  Assistive device Rolling walker (2 wheeled)  Gait Pattern/deviations Step-to pattern;Step-through pattern;Decreased stride length  General Gait Details cues for sequence, posture and position from RW  Gait velocity decr  PT - End of Session  Equipment Utilized During Treatment Gait belt  Activity Tolerance Patient tolerated treatment well  Patient left in chair;with call bell/phone within reach;with family/visitor present  Nurse Communication Mobility status   PT - Assessment/Plan  PT Plan Current plan remains appropriate  PT Visit Diagnosis Difficulty in walking, not elsewhere classified (R26.2)  PT Frequency (ACUTE ONLY) 7X/week  Follow Up Recommendations SNF  PT equipment None recommended by PT  AM-PAC PT "6 Clicks" Daily Activity Outcome Measure  Difficulty turning over in bed (including adjusting bedclothes, sheets and blankets)? 1  Difficulty moving from lying on back to sitting on the side  of the bed?  1  Difficulty sitting down on and standing up from a chair with arms (e.g., wheelchair, bedside commode, etc,.)? 1  Help needed moving to and from a bed to chair (including a wheelchair)? 3  Help needed walking in hospital room? 3  Help needed climbing 3-5 steps with a railing?  2  6 Click Score 11  Mobility G Code  CL  PT Goal Progression  Progress towards PT goals Progressing toward goals  Acute Rehab PT Goals  PT Goal Formulation With patient  Time For Goal Achievement 10/26/17  Potential to Achieve Goals Good  PT Time Calculation  PT Start Time (ACUTE ONLY) 1440  PT Stop Time (ACUTE ONLY) 1500  PT Time Calculation (min) (ACUTE ONLY) 20 min  PT General Charges  $$ ACUTE PT VISIT 1 Visit  PT Treatments  $Gait Training 8-22 mins

## 2017-10-21 NOTE — Progress Notes (Signed)
Physical Therapy Treatment Patient Details Name: Veronica Stanley MRN: [id number] DOB: Nov 18, 1945 Today's Date: 10/21/2017    History of Present Illness Pt s/p L THR     PT Comments    Pt progressing well; continue PT POC  Follow Up Recommendations  SNF     Equipment Recommendations  None recommended by PT    Recommendations for Other Services       Precautions / Restrictions Precautions Precautions: Fall Restrictions Other Position/Activity Restrictions: WBAT    Mobility  Bed Mobility               General bed mobility comments: in chair  Transfers Overall transfer level: Needs assistance Equipment used: Rolling walker (2 wheeled) Transfers: Sit to/from Stand Sit to Stand: Min guard;Min assist         General transfer comment: cues for hand placement, light assist to rise and transition to RW  Ambulation/Gait Ambulation/Gait assistance: Min guard Ambulation Distance (Feet): 95 Feet Assistive device: Rolling walker (2 wheeled) Gait Pattern/deviations: Shuffle;Trunk flexed;Scissoring;Decreased stance time - left;Step-through pattern;Step-to pattern Gait velocity: decr   General Gait Details: cues for sequence, posture and position from [employer]             Wheelchair Mobility    Modified Rankin (Stroke Patients Only)       Balance                                            Cognition Arousal/Alertness: Awake/alert Behavior During Therapy: WFL for tasks assessed/performed Overall Cognitive Status: Within Functional Limits for tasks assessed                                        Exercises Total Joint Exercises Ankle Circles/Pumps: AROM;Both;20 reps Quad Sets: AROM;Both;15 reps Heel Slides: AAROM;Left;20 reps Hip ABduction/ADduction: AAROM;Left;20 reps;AROM    General Comments        Pertinent Vitals/Pain Pain Assessment: No/denies pain Pain Score: 2  Pain Location: L hip Pain Descriptors  / Indicators: Sore Pain Intervention(s): Limited activity within patient's tolerance;Monitored during session;Premedicated before session;Repositioned    Home Living Family/patient expects to be discharged to:: Skilled nursing facility Living Arrangements: Alone                  Prior Function Level of Independence: Independent;Independent with assistive device(s)          PT Goals (current goals can now be found in the care plan section) Acute Rehab PT Goals Patient Stated Goal: Rehab and then home PT Goal Formulation: With patient Time For Goal Achievement: 10/26/17 Potential to Achieve Goals: Good Progress towards PT goals: Progressing toward goals    Frequency    7X/week      PT Plan Current plan remains appropriate    Co-evaluation              AM-PAC PT "6 Clicks" Daily Activity  Outcome Measure  Difficulty turning over in bed (including adjusting bedclothes, sheets and blankets)?: Unable Difficulty moving from lying on back to sitting on the side of the bed? : Unable Difficulty sitting down on and standing up from a chair with arms (e.g., wheelchair, bedside commode, etc,.)?: Unable Help needed moving to and from a bed to chair (including a wheelchair)?: A Little  Help needed walking in hospital room?: A Little Help needed climbing 3-5 steps with a railing? : A Lot 6 Click Score: 11    End of Session Equipment Utilized During Treatment: Gait belt Activity Tolerance: Patient tolerated treatment well Patient left: in chair;with call bell/phone within reach;with family/visitor present   PT Visit Diagnosis: Difficulty in walking, not elsewhere classified (R26.2)     Time: 8159-4707 PT Time Calculation (min) (ACUTE ONLY): 18 min  Charges:  $Gait Training: 8-22 mins                    G CodesKenyon Ana, PT Pager: [phone] 10/21/2017    Kenyon Ana 10/21/2017, 1:37 PM

## 2017-10-21 NOTE — Plan of Care (Signed)
Plan of care discussed with patient 

## 2017-10-21 NOTE — Progress Notes (Signed)
Subjective: 2 Days Post-Op Procedure(s) (LRB): LEFT TOTAL HIP ARTHROPLASTY ANTERIOR APPROACH (Left) Patient reports pain as mild.  Continuing to do well.  Objective: Vital signs in last 24 hours: Temp:  [97.8 F (36.6 C)-99.8 F (37.7 C)] 99.8 F (37.7 C) (05/05 0525) Pulse Rate:  [83-95] 95 (05/05 0525) Resp:  [14-16] 16 (05/05 0525) BP: (116-144)/(57-75) 144/75 (05/05 0525) SpO2:  [97 %-99 %] 97 % (05/05 0525)  Intake/Output from previous day: 05/04 0701 - 05/05 0700 In: 600 [P.O.:600] Out: 600 [Urine:600] Intake/Output this shift: Total I/O In: 240 [P.O.:240] Out: 300 [Urine:300]  Recent Labs    10/20/17 0530 10/21/17 0452  HGB 9.9* 9.4*   Recent Labs    10/20/17 0530 10/21/17 0452  WBC 7.3 8.8  RBC 3.41* 3.20*  HCT 31.8* 29.7*  PLT 199 203   Recent Labs    10/20/17 0530  NA 134*  K 4.6  CL 102  CO2 25  BUN 14  CREATININE 0.64  GLUCOSE 130*  CALCIUM 8.4*   No results for input(s): LABPT, INR in the last 72 hours.  Neurologically intact Neurovascular intact Sensation intact distally Intact pulses distally Dorsiflexion/Plantar flexion intact Incision: dressing C/D/I No cellulitis present Compartment soft    Assessment/Plan: 2 Days Post-Op Procedure(s) (LRB): LEFT TOTAL HIP ARTHROPLASTY ANTERIOR APPROACH (Left) Advance diet Up with therapy Plan for discharge tomorrow to SNF WBAT LLE-anterior hip precautions ABLA-mild and stable    Aundra Dubin 10/21/2017, 12:02 PM

## 2017-10-21 NOTE — Evaluation (Signed)
Occupational Therapy Evaluation Patient Details Name: Veronica Stanley MRN: [id number] DOB: [birth year] Today's Date: 10/21/2017    History of Present Illness Pt s/p L THR    Clinical Impression   This 72 year old female was admitted for the above. She will benefit from continued OT to increase independence with adls. Goals in acute setting are for min guard to min A  Level. She lives alone and will benefit from [facility] for rehab.     Follow Up Recommendations  SNF    Equipment Recommendations  3:1 commode   Recommendations for Other Services       Precautions / Restrictions Precautions Precautions: Fall Restrictions Other Position/Activity Restrictions: WBAT      Mobility Bed Mobility               General bed mobility comments: OOB in bathroom  Transfers   Equipment used: Rolling walker (2 wheeled)   Sit to Stand: Min assist;Mod assist         General transfer comment: from comfort height commode with grab bar    Balance                                           ADL either performed or assessed with clinical judgement   ADL Overall ADL's : Needs assistance/impaired Eating/Feeding: Independent   Grooming: Oral care;Supervision/safety;Standing   Upper Body Bathing: Set up;Sitting   Lower Body Bathing: Minimal assistance;Moderate assistance;Sit to/from stand   Upper Body Dressing : Set up   Lower Body Dressing: Maximal assistance;Sit to/from stand   Toilet Transfer: Minimal assistance;Moderate assistance;Ambulation;Comfort height toilet;RW Armed forces technical officer Details (indicate cue type and reason): more assistance to get up from lower surface Toileting- Clothing Manipulation and Hygiene: Minimal assistance;Moderate assistance;Sit to/from stand(for sit to stand)         General ADL Comments: performed ADL in bathroom     Vision         Perception     Praxis      Pertinent Vitals/Pain Pain Score: 2  Pain Location: L  hip Pain Descriptors / Indicators: Sore Pain Intervention(s): Limited activity within patient's tolerance;Monitored during session;Premedicated before session;Repositioned     Hand Dominance     Extremity/Trunk Assessment Upper Extremity Assessment Upper Extremity Assessment: Overall WFL for tasks assessed           Communication Communication Communication: No difficulties   Cognition Arousal/Alertness: Awake/alert Behavior During Therapy: WFL for tasks assessed/performed Overall Cognitive Status: Within Functional Limits for tasks assessed                                     General Comments       Exercises     Shoulder Instructions      Home Living Family/patient expects to be discharged to:: Skilled nursing facility Living Arrangements: Alone                                      Prior Functioning/Environment Level of Independence: Independent;Independent with assistive device(s)                 OT Problem List: Pain;Decreased knowledge of use of DME or AE;Decreased strength      OT Treatment/Interventions:  Self-care/ADL training;DME and/or AE instruction;Patient/family education    OT Goals(Current goals can be found in the care plan section) Acute Rehab OT Goals Patient Stated Goal: Rehab and then home OT Goal Formulation: With patient Time For Goal Achievement: 11/04/17 Potential to Achieve Goals: Good ADL Goals Pt Will Transfer to Toilet: with min guard assist;ambulating;bedside commode Pt Will Perform Toileting - Clothing Manipulation and hygiene: with min guard assist;sit to/from stand Additional ADL Goal #1: pt will perform LB adls with AE and min A  OT Frequency: Min 2X/week   Barriers to D/C:            Co-evaluation              AM-PAC PT "6 Clicks" Daily Activity     Outcome Measure Help from another person eating meals?: None Help from another person taking care of personal grooming?: A  Little Help from another person toileting, which includes using toliet, bedpan, or urinal?: A Lot Help from another person bathing (including washing, rinsing, drying)?: A Lot Help from another person to put on and taking off regular upper body clothing?: A Little Help from another person to put on and taking off regular lower body clothing?: A Lot 6 Click Score: 16   End of Session    Activity Tolerance: Patient tolerated treatment well Patient left: in chair;with call bell/phone within reach;with family/visitor present  OT Visit Diagnosis: Pain Pain - Right/Left: Left Pain - part of body: Hip                Time: 7169-6789 OT Time Calculation (min): 21 min Charges:  OT General Charges $OT Visit: 1 Visit OT Evaluation $OT Eval Low Complexity: 1 Low G-Codes:     [place], OTR/L [phone] 10/21/2017  Veronica Stanley 10/21/2017, 10:31 AM

## 2017-10-21 NOTE — Progress Notes (Addendum)
CSW met with patient and son at bedside. CSW confirmed patient's preference for SNF and informed patient that [facility] has offered a bed. [facility] is patient's only bed offer. [facility] to [facility] if [facility] have a private room. CSW confirmed with [facility] admissions that they do have a private room available. [facility] to start [facility]'s [facility] authorization Monday, 10/22/17 and can admit patient on Monday. CSW to follow and support.  Estanislado Emms, [place]

## 2017-10-22 ENCOUNTER — Encounter (HOSPITAL_COMMUNITY): Payer: Self-pay | Admitting: Orthopaedic Surgery

## 2017-10-22 LAB — CBC
HEMATOCRIT: 27.7 % — AB (ref 36.0–46.0)
Hemoglobin: 9 g/dL — ABNORMAL LOW (ref 12.0–15.0)
MCH: 30.1 pg (ref 26.0–34.0)
MCHC: 32.5 g/dL (ref 30.0–36.0)
MCV: 92.6 fL (ref 78.0–100.0)
PLATELETS: 195 10*3/uL (ref 150–400)
RBC: 2.99 MIL/uL — AB (ref 3.87–5.11)
RDW: 13.9 % (ref 11.5–15.5)
WBC: 7.9 10*3/uL (ref 4.0–10.5)

## 2017-10-22 MED ORDER — METHOCARBAMOL 500 MG PO TABS: 500.0000 mg | ORAL_TABLET | Freq: Four times a day (QID) | ORAL | 0 refills | Status: DC | PRN

## 2017-10-22 NOTE — Progress Notes (Signed)
Pt alert and oriented, tolerating diet. Pt was transported via [facility] to  [facility].  Packet was sent with pt.

## 2017-10-22 NOTE — Discharge Summary (Signed)
Patient ID: Veronica Stanley MRN: [id number] DOB/AGE: 1945-08-01 72 y.o.  Admit date: 10/19/2017 Discharge date: 10/22/2017  Admission Diagnoses:  Principal Problem:   Unilateral primary osteoarthritis, left hip Active Problems:   Status post total replacement of left hip   Discharge Diagnoses:  Same  Past Medical History:  Diagnosis Date  . Basal cell carcinoma, arm 11/13   on arm and face   . Diverticulitis    colon  . GERD (gastroesophageal reflux disease)   . Hypertension    patient denies not on meds   . Menopausal symptoms   . Osteoarthrosis involving, or with mention of more than one site, but not specified as generalized, multiple sites   . Osteoporosis     Surgeries: Procedure(s): LEFT TOTAL HIP ARTHROPLASTY ANTERIOR APPROACH on 10/19/2017   Consultants:   Discharged Condition: Improved  Hospital Course: LAJADA JANES is an 72 y.o. female who was admitted 10/19/2017 for operative treatment ofUnilateral primary osteoarthritis, left hip. Patient has severe unremitting pain that affects sleep, daily activities, and work/hobbies. After pre-op clearance the patient was taken to the operating room on 10/19/2017 and underwent  Procedure(s): LEFT TOTAL HIP ARTHROPLASTY ANTERIOR APPROACH.    Patient was given perioperative antibiotics:  Anti-infectives (From admission, onward)   Start     Dose/Rate Route Frequency Ordered Stop   10/19/17 1500  clindamycin (CLEOCIN) IVPB 600 mg     600 mg 100 mL/hr over 30 Minutes Intravenous Every 6 hours 10/19/17 1240 10/19/17 2119   10/19/17 0640  clindamycin (CLEOCIN) IVPB 900 mg     900 mg 100 mL/hr over 30 Minutes Intravenous On call to O.R. 10/19/17 7322 10/19/17 0254       Patient was given sequential compression devices, early ambulation, and chemoprophylaxis to prevent DVT.  Patient benefited maximally from hospital stay and there were no complications.    Recent vital signs:  Patient Vitals for the past 24 hrs:  BP Temp Temp  src Pulse Resp SpO2  10/22/17 0619 129/75 98.7 F (37.1 C) Oral 92 16 97 %  10/21/17 2245 (!) 154/71 98.2 F (36.8 C) Oral 86 16 100 %  10/21/17 1403 118/66 99 F (37.2 C) Oral 89 16 -     Recent laboratory studies:  Recent Labs    10/20/17 0530 10/21/17 0452 10/22/17 0443  WBC 7.3 8.8 7.9  HGB 9.9* 9.4* 9.0*  HCT 31.8* 29.7* 27.7*  PLT 199 203 195  NA 134*  --   --   K 4.6  --   --   CL 102  --   --   CO2 25  --   --   BUN 14  --   --   CREATININE 0.64  --   --   GLUCOSE 130*  --   --   CALCIUM 8.4*  --   --      Discharge Medications:   Allergies as of 10/22/2017      Reactions   Penicillins Anaphylaxis, Shortness Of Breath, Swelling, Rash   Has patient had a PCN reaction causing immediate rash, facial/tongue/throat swelling, SOB or lightheadedness with hypotension: Yes Has patient had a PCN reaction causing severe rash involving mucus membranes or skin necrosis: Yes Has patient had a PCN reaction that required hospitalization: No Has patient had a PCN reaction occurring within the last 10 years: No If all of the above answers are "NO", then may proceed with Cephalosporin use.   Ibandronate Sodium Other (See Comments)   aches  Medication List    STOP taking these medications   acetaminophen 650 MG CR tablet Commonly known as:  TYLENOL   BC FAST PAIN RELIEF 650-195-33.3 MG Pack Generic drug:  Aspirin-Salicylamide-Caffeine     TAKE these medications   aspirin EC 81 MG tablet Take 1 tablet (81 mg total) by mouth 2 (two) times daily.   HYDROcodone-acetaminophen 5-325 MG tablet Commonly known as:  NORCO Take 1-2 tablets by mouth every 6 (six) hours as needed for moderate pain.   methocarbamol 500 MG tablet Commonly known as:  ROBAXIN Take 1 tablet (500 mg total) by mouth every 6 (six) hours as needed for muscle spasms.   omeprazole 20 MG capsule Commonly known as:  PRILOSEC TAKE ONE CAPSULE BY MOUTH DAILY What changed:    how much to take  how to  take this  when to take this   ondansetron 4 MG tablet Commonly known as:  ZOFRAN Take 1 tablet (4 mg total) by mouth every 8 (eight) hours as needed for nausea or vomiting.            Durable Medical Equipment  (From admission, onward)        Start     Ordered   10/19/17 1241  DME 3 n 1  Once     10/19/17 1240   10/19/17 1241  DME Walker rolling  Once    Question:  Patient needs a walker to treat with the following condition  Answer:  Status post total replacement of left hip   10/19/17 1240      Diagnostic Studies: Dg Pelvis Portable  Result Date: 10/19/2017 CLINICAL DATA:  Total hip replacement. EXAM: PORTABLE PELVIS 1-2 VIEWS COMPARISON:  No prior. FINDINGS: Total left hip replacement. Hardware intact. No acute bony or joint abnormality. No evidence of fracture. IMPRESSION: Total left hip replacement. Hardware intact. No acute bony abnormality identified. Electronically Signed   By: Marcello Moores  Register   On: 10/19/2017 11:03   Dg C-arm 1-60 Min-no Report  Result Date: 10/19/2017 Fluoroscopy was utilized by the requesting physician.  No radiographic interpretation.   Dg Hip Operative Unilat W Or W/o Pelvis Left  Result Date: 10/19/2017 CLINICAL DATA:  Left hip prosthesis placement. EXAM: OPERATIVE LEFT HIP (WITH PELVIS IF PERFORMED) C-arm VIEWS TECHNIQUE: Fluoroscopic spot image(s) were submitted for interpretation post-operatively. COMPARISON:  None. FINDINGS: Left hip prosthesis in satisfactory position and alignment. No fracture or dislocation seen. IMPRESSION: Satisfactory appearance of a left hip prosthesis. Electronically Signed   By: Claudie Revering M.D.   On: 10/19/2017 15:26    Disposition: Discharge disposition: 03-Skilled [place]       Discharge Instructions    Discharge patient   Complete by:  As directed    Discharge disposition:  03-Skilled [place]   Discharge patient date:  10/22/2017       Contact information for follow-up providers     Mcarthur Rossetti, MD Follow up in 2 week(s).   Specialty:  Orthopedic Surgery Contact information: [place] [address] [phone]            Contact information for after-discharge care    Destination    [facility] .   Service:  Skilled Nursing Contact information: [address] [place] [phone]                   Signed: Mcarthur Rossetti 10/22/2017, 7:09 AM

## 2017-10-22 NOTE — Progress Notes (Signed)
CSW following to assist patient with discharge to [facility]. CSW contacted [facility] staff member Broadus John to confirm patient's bed offer and to inquire about patient's insurance authorization, no answer nor option to leave a voicemail. CSW will continue to reach out to [facility].  Abundio Miu, [place] Social Worker [facility] Cell#: [phone]

## 2017-10-22 NOTE — Progress Notes (Signed)
CSW contacted [facility] and spoke with admissions staff member Tammy. Staff reported that they are still waiting to hear back from [place] regarding patient's authorization. Staff agreed to contact CSW once authorization is received to schedule time for patient to arrive.  CSW awaiting return call from [facility] to schedule patient's discharge. CSW will continue to follow and assist with discharge planning.  Abundio Miu, [place] Social Worker [facility] Cell#: [phone]

## 2017-10-22 NOTE — Clinical Social Work Placement (Signed)
Patient received and accepted bed offer at [facility]. Facility aware of patient's discharge and confirmed patient's bed offer. [facility] contacted, patient's family notified. Patient's RN can call report to [phone], packet complete. CSW signing off, no other needs identified at this time.  CLINICAL SOCIAL WORK PLACEMENT  NOTE  Date:  10/22/2017  Patient Details  Name: Veronica Stanley MRN: [id number] Date of Birth: May 01, 1946  Clinical Social Work is seeking post-discharge placement for this patient at the [place] level of care (*CSW will initial, date and re-position this form in  chart as items are completed):  Yes   Patient/family provided with [place] Work Department's list of facilities offering this level of care within the geographic area requested by the patient (or if unable, by the patient's family).  Yes   Patient/family informed of their freedom to choose among providers that offer the needed level of care, that participate in Medicare, Medicaid or managed care program needed by the patient, have an available bed and are willing to accept the patient.  Yes   Patient/family informed of [facility]'s ownership interest in [facility] and [facility], as well as of the fact that they are under no obligation to receive care at these facilities.  PASRR submitted to EDS on       PASRR number received on 10/20/17     Existing PASRR number confirmed on       FL2 transmitted to all facilities in geographic area requested by pt/family on 10/20/17     FL2 transmitted to all facilities within larger geographic area on       Patient informed that his/her managed care company has contracts with or will negotiate with certain facilities, including the following:        Yes   Patient/family informed of bed offers received.  Patient chooses bed at [facility]     Physician recommends and patient chooses bed at      Patient  to be transferred to [facility] [place] on 10/22/17.  Patient to be transferred to facility by [facility]     Patient family notified on 10/22/17 of transfer.  Name of family member notified:  [facility]     PHYSICIAN       Additional Comment:    _______________________________________________ Burnis Medin, LCSW 10/22/2017, 12:06 PM

## 2017-10-22 NOTE — Progress Notes (Signed)
Patient ID: Veronica Stanley, female   DOB: 28-Dec-1945, 72 y.o.   MRN: [id number] Doing well overall.  Vitals stable and hip stable.  Can be discharged to skilled nursing today.

## 2017-10-29 ENCOUNTER — Telehealth: Payer: Self-pay

## 2017-10-29 NOTE — Telephone Encounter (Signed)
Copied from [place] [id number]. Topic: Inquiry >> Oct 29, 2017  9:12 AM Margot Ables wrote: Reason for CRM: Pt states that she had a message to call the office. I do not see notes in Epic. She came home from rehab Saturday morning. She has f/u with ortho Dr. Rush Farmer 11/01/17. Please advise.

## 2017-10-29 NOTE — Telephone Encounter (Signed)
Just make sure she is doing okay No appointment here necessary unless she has other issues

## 2017-10-29 NOTE — Telephone Encounter (Signed)
Veronica Stanley had called the pt to see how she was doing after Veronica Stanley sent a note for [name] to check on her.

## 2017-11-01 ENCOUNTER — Ambulatory Visit (INDEPENDENT_AMBULATORY_CARE_PROVIDER_SITE_OTHER): Payer: Medicare Other | Admitting: Orthopaedic Surgery

## 2017-11-01 ENCOUNTER — Encounter (INDEPENDENT_AMBULATORY_CARE_PROVIDER_SITE_OTHER): Payer: Self-pay | Admitting: Orthopaedic Surgery

## 2017-11-01 DIAGNOSIS — Z96642 Presence of left artificial hip joint: Secondary | ICD-10-CM

## 2017-11-01 NOTE — Progress Notes (Signed)
The patient is now 2 weeks status post a left total hip arthroplasty.  She is doing well overall.  She has more therapy sessions to have at home and feels like she is made great progress.  On exam the staples been removed and Steri-Strips applied.  There is a mild seroma but nothing needs to be drained right now.  Her leg lengths appear equal.  She tolerates me putting her hip through motion.  At this point should continue increase her activities as comfort allows.  We will see her back in a month see how she is doing overall but obviously sooner if there is any issues.

## 2017-11-10 ENCOUNTER — Ambulatory Visit
Admission: EM | Admit: 2017-11-10 | Discharge: 2017-11-10 | Disposition: A | Discharge status: Home / Self Care | Payer: Medicare Other | Attending: Family Medicine | Admitting: Family Medicine

## 2017-11-10 ENCOUNTER — Other Ambulatory Visit: Payer: Self-pay

## 2017-11-10 ENCOUNTER — Encounter: Payer: Self-pay | Admitting: Gynecology

## 2017-11-10 DIAGNOSIS — N3001 Acute cystitis with hematuria: Secondary | ICD-10-CM

## 2017-11-10 DIAGNOSIS — R300 Dysuria: Secondary | ICD-10-CM

## 2017-11-10 LAB — URINALYSIS, COMPLETE (UACMP) WITH MICROSCOPIC
Bilirubin Urine: NEGATIVE
Glucose, UA: NEGATIVE mg/dL
KETONES UR: NEGATIVE mg/dL
Nitrite: NEGATIVE
PH: 6.5 (ref 5.0–8.0)
PROTEIN: 100 mg/dL — AB
RBC / HPF: >50 RBC/hpf (ref 0–5)
Specific Gravity, Urine: 1.015 (ref 1.005–1.030)
WBC, UA: >50 WBC/hpf (ref 0–5)

## 2017-11-10 MED ORDER — SULFAMETHOXAZOLE-TRIMETHOPRIM 800-160 MG PO TABS: 1.0000 | ORAL_TABLET | Freq: Two times a day (BID) | ORAL | 0 refills | Status: AC

## 2017-11-10 NOTE — ED Provider Notes (Signed)
[facility]    CSN: [id number] Arrival date & time: 11/10/17  1332     History   Chief Complaint Chief Complaint  Patient presents with  . Urinary Tract Infection    HPI Veronica Stanley is a 72 y.o. female.   Presents to the urgent care facility for evaluation of possible urinary tract infection.  Patient states last night she developed painful urination and increase in urinary frequency.  She has had some mild intermittent symptoms on and off for 3 weeks but last night her symptoms increased.  She is also complaining of some very mild intermittent left lower back pain.  No nausea or vomiting.  No fevers.  She denies any history of recent UTI.  HPI  Past Medical History:  Diagnosis Date  . Basal cell carcinoma, arm 11/13   on arm and face   . Diverticulitis    colon  . GERD (gastroesophageal reflux disease)   . Hypertension    patient denies not on meds   . Menopausal symptoms   . Osteoarthrosis involving, or with mention of more than one site, but not specified as generalized, multiple sites   . Osteoporosis     Patient Active Problem List   Diagnosis Date Noted  . 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 Unilateral primary osteoarthritis, left hip 09/17/2017  . Pain in left hip 09/17/2017  . Osteoarthritis of both hips 11/10/2015  . Advance directive discussed with patient 11/03/2014  . Routine general medical examination at a health care facility 03/03/2011  . Osteoarthritis, multiple sites   . GERD 10/11/2007  . ACTINIC KERATOSIS 04/17/2007  . Essential hypertension, benign 03/28/2007  . DIVERTICULOSIS, COLON 03/28/2007  . OSTEOPOROSIS 03/28/2007    Past Surgical History:  Procedure Laterality Date  . ABDOMINAL HYSTERECTOMY    . BREAST BIOPSY Left 2003   Left breast calcifications removed  . DILATION AND CURETTAGE OF UTERUS     rupture- peritonitis & hysterectomy 12/79  . TOTAL HIP ARTHROPLASTY Left 10/19/2017   Procedure: LEFT  TOTAL HIP ARTHROPLASTY ANTERIOR APPROACH;  Surgeon: Mcarthur Rossetti, MD;  Location: [facility];  Service: Orthopedics;  Laterality: Left;  Marland Kitchen VAGINAL DELIVERY     x2    OB History   None      Home Medications    Prior to Admission medications   Medication Sig Start Date End Date Taking? Authorizing Provider  aspirin EC 81 MG tablet Take 1 tablet (81 mg total) by mouth 2 (two) times daily. 10/20/17  Yes Aundra Dubin, PA-C  omeprazole (PRILOSEC) 20 MG capsule TAKE ONE CAPSULE BY MOUTH DAILY Patient taking differently: TAKE ONE CAPSULE BY MOUTH DAILY AS NEEDED FOR HEARTBURN OR INDIGESTION. 04/16/17  Yes Venia Carbon, MD  HYDROcodone-acetaminophen (NORCO) 5-325 MG tablet Take 1-2 tablets by mouth every 6 (six) hours as needed for moderate pain. 10/20/17   Aundra Dubin, PA-C  methocarbamol (ROBAXIN) 500 MG tablet Take 1 tablet (500 mg total) by mouth every 6 (six) hours as needed for muscle spasms. 10/22/17   Mcarthur Rossetti, MD  ondansetron (ZOFRAN) 4 MG tablet Take 1 tablet (4 mg total) by mouth every 8 (eight) hours as needed for nausea or vomiting. 10/20/17   Aundra Dubin, PA-C  sulfamethoxazole-trimethoprim (BACTRIM DS,SEPTRA DS) 800-160 MG tablet Take 1 tablet by mouth 2 (two) times daily for 7 days. 11/10/17 11/17/17  Duanne Guess, PA-C    Family History Family History  Problem  Relation Age of Onset  . Osteoporosis Mother   . Coronary artery disease Father   . Breast cancer Sister 39  . Cancer Maternal Grandfather        colon cancer  . Diabetes Neg Hx   . Hypertension Neg Hx     Social History Social History   Tobacco Use  . Smoking status: Never Smoker  . Smokeless tobacco: Never Used  Substance Use Topics  . Alcohol use: Yes    Comment: occasional  . Drug use: Never     Allergies   Penicillins and Ibandronate sodium   Review of Systems Review of Systems  Constitutional: Negative for fever.  Cardiovascular: Negative for leg swelling.    Gastrointestinal: Negative for abdominal pain, nausea and vomiting.  Genitourinary: Positive for dysuria, frequency and urgency. Negative for hematuria.  Musculoskeletal: Positive for back pain.  Neurological: Negative for dizziness, light-headedness, numbness and headaches.     Physical Exam Triage Vital Signs ED Triage Vitals  Enc Vitals Group     BP 11/10/17 1356 (!) 160/80     Pulse Rate 11/10/17 1356 91     Resp 11/10/17 1356 16     Temp 11/10/17 1356 98.3 F (36.8 C)     Temp Source 11/10/17 1356 Oral     SpO2 11/10/17 1356 98 %     Weight 11/10/17 1354 225 lb (102.1 kg)     Height --      Head Circumference --      [facility] --      Pain Score 11/10/17 1354 3     Pain Loc --      Pain Edu? --      Excl. in [place]? --    No data found.  Updated Vital Signs BP (!) 160/80   Pulse 91   Temp 98.3 F (36.8 C) (Oral)   Resp 16   Wt 225 lb (102.1 kg)   SpO2 98%   BMI 35.24 kg/m   Visual Acuity Right Eye Distance:   Left Eye Distance:   Bilateral Distance:    Right Eye Near:   Left Eye Near:    Bilateral Near:     Physical Exam  Constitutional: She is oriented to person, place, and time. She appears well-developed and well-nourished. No distress.  HENT:  Head: Normocephalic and atraumatic.  Mouth/Throat: Oropharynx is clear and moist.  Eyes: Pupils are equal, round, and reactive to light. EOM are normal. Right eye exhibits no discharge. Left eye exhibits no discharge.  Neck: Normal range of motion. Neck supple.  Cardiovascular: Normal rate, regular rhythm and intact distal pulses.  Pulmonary/Chest: Effort normal and breath sounds normal. No respiratory distress. She exhibits no tenderness.  Abdominal: Soft. She exhibits no distension. There is no tenderness.  No CVA tenderness bilaterally.  Musculoskeletal: Normal range of motion. She exhibits no edema.  Neurological: She is alert and oriented to person, place, and time. She has normal reflexes.  Skin: Skin  is warm and dry.  Psychiatric: She has a normal mood and affect. Her behavior is normal. Thought content normal.     UC Treatments / Results  Labs (all labs ordered are listed, but only abnormal results are displayed) Labs Reviewed  URINALYSIS, COMPLETE (UACMP) WITH MICROSCOPIC - Abnormal; Notable for the following components:      Result Value   Color, Urine STRAW (*)    APPearance CLOUDY (*)    Hgb urine dipstick MODERATE (*)    Protein, ur 100 (*)  Leukocytes, UA SMALL (*)    Bacteria, UA MANY (*)    All other components within normal limits  URINE CULTURE    EKG None  Radiology No results found.  Procedures Procedures (including critical care time)  Medications Ordered in UC Medications - No data to display  Initial Impression / Assessment and Plan / UC Course  I have reviewed the triage vital signs and the nursing notes.  Pertinent labs & imaging results that were available during my care of the patient were reviewed by me and considered in my medical decision making (see chart for details).    72 year old female positive for acute UTI with hematuria.  Will treat with Bactrim DS for 7 days that she was having some mild left lower back pain.  She is tolerating p.o. well.  She is afebrile.  Final Clinical Impressions(s) / UC Diagnoses   Final diagnoses:  Dysuria  Acute cystitis with hematuria     Discharge Instructions     Please drink lots of fluids.  Take antibiotics as prescribed.  If any fevers, nausea, vomiting, increasing back pain return to the urgent care facility or emergency department.    ED Prescriptions    Medication Sig Dispense Auth. Provider   sulfamethoxazole-trimethoprim (BACTRIM DS,SEPTRA DS) 800-160 MG tablet Take 1 tablet by mouth 2 (two) times daily for 7 days. 14 tablet Renata Caprice       Duanne Guess, [place] 11/10/17 1419

## 2017-11-10 NOTE — Discharge Instructions (Addendum)
Please drink lots of fluids.  Take antibiotics as prescribed.  If any fevers, nausea, vomiting, increasing back pain return to the urgent care facility or emergency department.

## 2017-11-10 NOTE — ED Triage Notes (Signed)
Patient c/o painful urination x last night.

## 2017-11-13 ENCOUNTER — Telehealth (HOSPITAL_COMMUNITY): Payer: Self-pay

## 2017-11-13 ENCOUNTER — Telehealth: Payer: Self-pay

## 2017-11-13 LAB — URINE CULTURE

## 2017-11-13 NOTE — Telephone Encounter (Signed)
PLEASE NOTE: All timestamps contained within this report are represented as [place] Standard Time. CONFIDENTIALTY NOTICE: This fax transmission is intended only for the addressee. It contains information that is legally privileged, confidential or otherwise protected from use or disclosure. If you are not the intended recipient, you are strictly prohibited from reviewing, disclosing, copying using or disseminating any of this information or taking any action in reliance on or regarding this information. If you have received this fax in error, please notify us immediately by telephone so that we can arrange for its return to [name]. Phone: [phone], Toll-Free: [phone], Fax: [phone] Page: 1 of 2 Call Id: [id number] [place] Patient Name: Veronica Stanley Gender: Female DOB: 02-23-1946 Age: 71 Y 10 M 20 D Return Phone Number: [phone] (Primary), [phone] (Secondary) Address: City/State/Zip: [place] [address] Client [facility] Night - Client Client Site [place] - Night Physician Viviana Simpler - MD Contact Type Call Who Is Calling Patient / Member / Family / Caregiver Call Type Triage / Clinical Relationship To Patient Self Return Phone Number [phone] (Primary) Chief Complaint Urination Pain Reason for Call Symptomatic / Request for [place] states she had a hip replacement 3 weeks ago. Thinks she has a UTI. Did a home test and it is purple and pink. No fever. Does not feel right after urination. Has felt this way since catheter was removed. Pharmacy open until 2pm today. Sunday hours 1-5pm. Caller is allergic to penicillin. Translation No Nurse Assessment Nurse: Allene Dillon, RN, Tabatha Date/Time (Eastern Time): 11/10/2017 12:27:30 PM Confirm and document reason for call. If symptomatic, describe  symptoms. ---Caller states she had a hip replacement 3 weeks ago. Thinks she has a UTI. Did a home test and it is purple and pink. No fever. Does not feel right after urination. Has felt this way since catheter was removed. Pharmacy open until 2pm today. Sunday hours 1-5pm. Caller is allergic to penicillin. Does the patient have any new or worsening symptoms? ---Yes Will a triage be completed? ---Yes Related visit to physician within the last 2 weeks? ---Yes Does the PT have any chronic conditions? (i.e. diabetes, asthma, etc.) ---No Is this a behavioral health or substance abuse call? ---No Guidelines Guideline Title Affirmed Question Affirmed Notes Nurse Date/Time Eilene Ghazi Time) Urination Pain - Female Side (flank) or lower back pain present Allene Dillon, RN, Tabatha 11/10/2017 12:28:41 PM Disp. Time Eilene Ghazi Time) Disposition Final User 11/10/2017 12:37:39 PM See Physician within 4 Hours (or PCP triage) Yes Allene Dillon, RN, Tabatha PLEASE NOTE: All timestamps contained within this report are represented as [place] Standard Time. CONFIDENTIALTY NOTICE: This fax transmission is intended only for the addressee. It contains information that is legally privileged, confidential or otherwise protected from use or disclosure. If you are not the intended recipient, you are strictly prohibited from reviewing, disclosing, copying using or disseminating any of this information or taking any action in reliance on or regarding this information. If you have received this fax in error, please notify us immediately by telephone so that we can arrange for its return to [name]. Phone: [phone], Toll-Free: [phone], Fax: [phone] Page: 2 of 2 Call Id: [id number] [place] Disagree/Comply Comply Caller Understands Yes PreDisposition InappropriateToAsk Care Advice Given Per Guideline SEE PHYSICIAN WITHIN 4 HOURS (or PCP triage): CARE ADVICE given per Urination Pain - Female (Adult) guideline. * You become  worse. CALL BACK IF: Referrals [facility] [place]

## 2017-11-13 NOTE — Telephone Encounter (Signed)
Pt was seen [facility] on 11/10/17.

## 2017-11-13 NOTE — Telephone Encounter (Signed)
Patient contacted regarding positive urine culture.  Positive for E.coli, this was treated with Bactrim at urgent care visit.

## 2017-11-13 NOTE — Telephone Encounter (Signed)
Please check on her today

## 2017-11-13 NOTE — Telephone Encounter (Signed)
Spoke to pt. She said she is feeling much better with the antibiotic.

## 2017-11-22 ENCOUNTER — Other Ambulatory Visit: Payer: Medicare Other

## 2017-11-28 ENCOUNTER — Ambulatory Visit (INDEPENDENT_AMBULATORY_CARE_PROVIDER_SITE_OTHER): Payer: Medicare Other | Admitting: Internal Medicine

## 2017-11-28 ENCOUNTER — Encounter: Payer: Self-pay | Admitting: Internal Medicine

## 2017-11-28 VITALS — BP 110/76 | HR 79 | Temp 98.1°F | Ht 67.0 in | Wt 226.0 lb

## 2017-11-28 DIAGNOSIS — K2100 Gastro-esophageal reflux disease with esophagitis, without bleeding: Secondary | ICD-10-CM

## 2017-11-28 DIAGNOSIS — Z7189 Other specified counseling: Secondary | ICD-10-CM

## 2017-11-28 DIAGNOSIS — Z23 Encounter for immunization: Secondary | ICD-10-CM

## 2017-11-28 DIAGNOSIS — I10 Essential (primary) hypertension: Secondary | ICD-10-CM | POA: Diagnosis not present

## 2017-11-28 DIAGNOSIS — M160 Bilateral primary osteoarthritis of hip: Secondary | ICD-10-CM | POA: Diagnosis not present

## 2017-11-28 DIAGNOSIS — Z0000 Encounter for general adult medical examination without abnormal findings: Secondary | ICD-10-CM

## 2017-11-28 LAB — COMPREHENSIVE METABOLIC PANEL
ALBUMIN: 3.8 g/dL (ref 3.5–5.2)
ALT: 8 U/L (ref 0–35)
AST: 13 U/L (ref 0–37)
Alkaline Phosphatase: 84 U/L (ref 39–117)
BUN: 16 mg/dL (ref 6–23)
CHLORIDE: 103 meq/L (ref 96–112)
CO2: 31 mEq/L (ref 19–32)
Calcium: 9.8 mg/dL (ref 8.4–10.5)
Creatinine, Ser: 0.68 mg/dL (ref 0.40–1.20)
GFR: 89.91 mL/min (ref >60.00)
Glucose, Bld: 102 mg/dL — ABNORMAL HIGH (ref 70–99)
Potassium: 4.3 mEq/L (ref 3.5–5.1)
SODIUM: 141 meq/L (ref 135–145)
Total Bilirubin: 0.4 mg/dL (ref 0.2–1.2)
Total Protein: 7.5 g/dL (ref 6.0–8.3)

## 2017-11-28 LAB — CBC
HEMATOCRIT: 35.5 % — AB (ref 36.0–46.0)
Hemoglobin: 11.4 g/dL — ABNORMAL LOW (ref 12.0–15.0)
MCHC: 32.2 g/dL (ref 30.0–36.0)
MCV: 87.5 fl (ref 78.0–100.0)
PLATELETS: 327 10*3/uL (ref 150.0–400.0)
RBC: 4.06 Mil/uL (ref 3.87–5.11)
RDW: 14.5 % (ref 11.5–15.5)
WBC: 5.7 10*3/uL (ref 4.0–10.5)

## 2017-11-28 NOTE — Addendum Note (Signed)
Addended by: Pilar Grammes on: 11/28/2017 10:15 AM   Modules accepted: Orders

## 2017-11-28 NOTE — Progress Notes (Signed)
Subjective:    Patient ID: Veronica Stanley, female    DOB: Dec 14, 1945, 72 y.o.   MRN: [id number]  HPI Here for Medicare wellness visit and follow up of chronic health conditions Reviewed form and advanced directives Reviewed other doctors Occasional drink of alcohol No tobacco Exercising regularly Vision and hearing are fine [place] once in bathroom in slick slippers (tangled in rug). Had knot on head and right elbow Independent with instrumental ADLs No sig memory issues  Did great with the THR Has recovered completely Some right hip pain---but not bad enough to consider  Recent UTI Better from this now Seen at urgent care  Recent trip with friends She snores a lot---they thought she may have had some apnea Some AM grogginess Does have some daytime somnolence---but nothing all that significance  Continues on the PPI This controls her symptoms No dysphagia  Current Outpatient Medications on File Prior to Visit  Medication Sig Dispense Refill  . omeprazole (PRILOSEC) 20 MG capsule TAKE ONE CAPSULE BY MOUTH DAILY (Patient taking differently: TAKE ONE CAPSULE BY MOUTH DAILY AS NEEDED FOR HEARTBURN OR INDIGESTION.) 30 capsule 11   No current facility-administered medications on file prior to visit.     Allergies  Allergen Reactions  . Penicillins Anaphylaxis, Shortness Of Breath, Swelling and Rash    Has patient had a PCN reaction causing immediate rash, facial/tongue/throat swelling, SOB or lightheadedness with hypotension: Yes Has patient had a PCN reaction causing severe rash involving mucus membranes or skin necrosis: Yes Has patient had a PCN reaction that required hospitalization: No Has patient had a PCN reaction occurring within the last 10 years: No If all of the above answers are "NO", then may proceed with Cephalosporin use.   . Ibandronate Sodium Other (See Comments)    aches    Past Medical History:  Diagnosis Date  . Basal cell carcinoma, arm 11/13   on  arm and face   . Diverticulitis    colon  . GERD (gastroesophageal reflux disease)   . Hypertension    patient denies not on meds   . Menopausal symptoms   . Osteoarthrosis involving, or with mention of more than one site, but not specified as generalized, multiple sites   . Osteoporosis     Past Surgical History:  Procedure Laterality Date  . ABDOMINAL HYSTERECTOMY    . BREAST BIOPSY Left 2003   Left breast calcifications removed  . DILATION AND CURETTAGE OF UTERUS     rupture- peritonitis & hysterectomy 12/79  . TOTAL HIP ARTHROPLASTY Left 10/19/2017   Procedure: LEFT TOTAL HIP ARTHROPLASTY ANTERIOR APPROACH;  Surgeon: Mcarthur Rossetti, MD;  Location: [facility];  Service: Orthopedics;  Laterality: Left;  Marland Kitchen VAGINAL DELIVERY     x2    Family History  Problem Relation Age of Onset  . Osteoporosis Mother   . Coronary artery disease Father   . Breast cancer Sister 57  . Cancer Maternal Grandfather        colon cancer  . Diabetes Neg Hx   . Hypertension Neg Hx     Social History   Socioeconomic History  . Marital status: Divorced    Spouse name: Not on file  . Number of children: 2  . Years of education: Not on file  . Highest education level: Not on file  Occupational History  . 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 Occupation:    Social Needs  . Financial resource strain:  Not on file  . Food insecurity:    Worry: Not on file    Inability: Not on file  . Transportation needs:    Medical: Not on file    Non-medical: Not on file  Tobacco Use  . Smoking status: Never Smoker  . Smokeless tobacco: Never Used  Substance and Sexual Activity  . Alcohol use: Yes    Comment: occasional  . Drug use: Never  . Sexual activity: Not on file  Lifestyle  . Physical activity:    Days per week: Not on file    Minutes per session: Not on file  . Stress: Not on file  Relationships  . Social connections:    Talks on phone: Not on  file    Gets together: Not on file    Attends religious service: Not on file    Active member of club or organization: Not on file    Attends meetings of clubs or organizations: Not on file    Relationship status: Not on file  . Intimate partner violence:    Fear of current or ex partner: Not on file    Emotionally abused: Not on file    Physically abused: Not on file    Forced sexual activity: Not on file  Other Topics Concern  . Not on file  Social History Narrative   No living will   Would want daughter, Margarita Grizzle,  Then son Ginnie Smart to make health care decisions for her   Would accept resuscitation attempts   Not sure about tube feedings   Review of Systems Appetite is fine Weight is 5# down from last year Generally sleeps okay Wears seat belt Teeth are fine---keeps up with dentist No rash or suspicious skin lesions now Bowels are fine. No blood unless hemorrhoid flares No urinary problems---but urgency at night (nocturia x 2-3). Occasional urge incontinence No chest pain or SOB No palpitations No dizziness or syncope No edema except mild in left leg post-op No other sig back or joint pains (right hip only)    Objective:   Physical Exam  Constitutional: She is oriented to person, place, and time. She appears well-developed. No distress.  HENT:  Mouth/Throat: Oropharynx is clear and moist. No oropharyngeal exudate.  Neck: No thyromegaly present.  Cardiovascular: Normal rate, regular rhythm, normal heart sounds and intact distal pulses. Exam reveals no gallop.  No murmur heard. Respiratory: Effort normal and breath sounds normal. No respiratory distress. She has no wheezes. She has no rales.  GI: Soft. There is no tenderness.  Musculoskeletal: She exhibits no edema or tenderness.  Only mildly decreased internal rotation of right hip  Lymphadenopathy:    She has no cervical adenopathy.  Neurological: She is alert and oriented to person, place, and time.  President---  "Dwaine Deter, Bush" [phone] [id number] Recall 3/3  Skin: No rash noted. No erythema.  Psychiatric: She has a normal mood and affect. Her behavior is normal.           Assessment & Plan:

## 2017-11-28 NOTE — Assessment & Plan Note (Signed)
Elevated BP and bad arthritis complicating this Discussed DASH and increasing her exercise Goal 10-15# loss in next year

## 2017-11-28 NOTE — Assessment & Plan Note (Signed)
Quiet on the PPI No evidence of esophagitis now

## 2017-11-28 NOTE — Progress Notes (Signed)
Hearing Screening   Method: Audiometry   125Hz 250Hz 500Hz 1000Hz 2000Hz 3000Hz 4000Hz 6000Hz 8000Hz  Right ear:   20 20 20  20    Left ear:   20 20 20  20      Visual Acuity Screening   Right eye Left eye Both eyes  Without correction: 20/25 20/25 20/20  With correction:       

## 2017-11-28 NOTE — Assessment & Plan Note (Signed)
BP Readings from Last 3 Encounters:  11/28/17 110/76  11/10/17 (!) 160/80  10/22/17 129/75   BP much better now No Rx

## 2017-11-28 NOTE — Assessment & Plan Note (Signed)
Still no advanced directive formalized See social history

## 2017-11-28 NOTE — Assessment & Plan Note (Signed)
Doing great after left THR

## 2017-11-28 NOTE — Patient Instructions (Addendum)
Let me know if you have more trouble with day time alertness---I will refer you for a sleep evaluation.    DASH Eating Plan DASH stands for "Dietary Approaches to Stop Hypertension." The DASH eating plan is a healthy eating plan that has been shown to reduce high blood pressure (hypertension). It may also reduce your risk for type 2 diabetes, heart disease, and stroke. The DASH eating plan may also help with weight loss. What are tips for following this plan? General guidelines  Avoid eating more than 2,300 mg (milligrams) of salt (sodium) a day. If you have hypertension, you may need to reduce your sodium intake to 1,500 mg a day.  Limit alcohol intake to no more than 1 drink a day for nonpregnant women and 2 drinks a day for men. One drink equals 12 oz of beer, 5 oz of wine, or 1 oz of hard liquor.  Work with your health care provider to maintain a healthy body weight or to lose weight. Ask what an ideal weight is for you.  Get at least 30 minutes of exercise that causes your heart to beat faster (aerobic exercise) most days of the week. Activities may include walking, swimming, or biking.  Work with your health care provider or diet and nutrition specialist (dietitian) to adjust your eating plan to your individual calorie needs. Reading food labels  Check food labels for the amount of sodium per serving. Choose foods with less than 5 percent of the Daily Value of sodium. Generally, foods with less than 300 mg of sodium per serving fit into this eating plan.  To find whole grains, look for the word "whole" as the first word in the ingredient list. Shopping  Buy products labeled as "low-sodium" or "no salt added."  Buy fresh foods. Avoid canned foods and premade or frozen meals. Cooking  Avoid adding salt when cooking. Use salt-free seasonings or herbs instead of table salt or sea salt. Check with your health care provider or pharmacist before using salt substitutes.  Do not fry  foods. Cook foods using healthy methods such as baking, boiling, grilling, and broiling instead.  Cook with heart-healthy oils, such as olive, canola, soybean, or sunflower oil. Meal planning   Eat a balanced diet that includes: ? 5 or more servings of fruits and vegetables each day. At each meal, try to fill half of your plate with fruits and vegetables. ? Up to 6-8 servings of whole grains each day. ? Less than 6 oz of lean meat, poultry, or fish each day. A 3-oz serving of meat is about the same size as a deck of cards. One egg equals 1 oz. ? 2 servings of low-fat dairy each day. ? A serving of nuts, seeds, or beans 5 times each week. ? Heart-healthy fats. Healthy fats called Omega-3 fatty acids are found in foods such as flaxseeds and coldwater fish, like sardines, salmon, and mackerel.  Limit how much you eat of the following: ? Canned or prepackaged foods. ? Food that is high in trans fat, such as fried foods. ? Food that is high in saturated fat, such as fatty meat. ? Sweets, desserts, sugary drinks, and other foods with added sugar. ? Full-fat dairy products.  Do not salt foods before eating.  Try to eat at least 2 vegetarian meals each week.  Eat more home-cooked food and less restaurant, buffet, and fast food.  When eating at a restaurant, ask that your food be prepared with less salt or no  salt, if possible. What foods are recommended? The items listed may not be a complete list. Talk with your dietitian about what dietary choices are best for you. Grains Whole-grain or whole-wheat bread. Whole-grain or whole-wheat pasta. Brown rice. [place]. Bulgur. Whole-grain and low-sodium cereals. Pita bread. Low-fat, low-sodium crackers. Whole-wheat flour tortillas. Vegetables Fresh or frozen vegetables (raw, steamed, roasted, or grilled). Low-sodium or reduced-sodium tomato and vegetable juice. Low-sodium or reduced-sodium tomato sauce and tomato paste. Low-sodium or  reduced-sodium canned vegetables. Fruits All fresh, dried, or frozen fruit. Canned fruit in natural juice (without added sugar). Meat and other protein foods Skinless chicken or [place]. Ground chicken or [place]. Pork with fat trimmed off. Fish and seafood. Egg whites. Dried beans, peas, or lentils. Unsalted nuts, nut butters, and seeds. Unsalted canned beans. Lean cuts of beef with fat trimmed off. Low-sodium, lean deli meat. Dairy Low-fat (1%) or fat-free (skim) milk. Fat-free, low-fat, or reduced-fat cheeses. Nonfat, low-sodium ricotta or cottage cheese. Low-fat or nonfat yogurt. Low-fat, low-sodium cheese. Fats and oils Soft margarine without trans fats. Vegetable oil. Low-fat, reduced-fat, or light mayonnaise and salad dressings (reduced-sodium). Canola, safflower, olive, soybean, and sunflower oils. Avocado. Seasoning and other foods Herbs. Spices. Seasoning mixes without salt. Unsalted popcorn and pretzels. Fat-free sweets. What foods are not recommended? The items listed may not be a complete list. Talk with your dietitian about what dietary choices are best for you. Grains Baked goods made with fat, such as croissants, muffins, or some breads. Dry pasta or rice meal packs. Vegetables Creamed or fried vegetables. Vegetables in a cheese sauce. Regular canned vegetables (not low-sodium or reduced-sodium). Regular canned tomato sauce and paste (not low-sodium or reduced-sodium). Regular tomato and vegetable juice (not low-sodium or reduced-sodium). Angie Fava. Olives. Fruits Canned fruit in a light or heavy syrup. Fried fruit. Fruit in cream or butter sauce. Meat and other protein foods Fatty cuts of meat. Ribs. Fried meat. Berniece Salines. Sausage. Bologna and other processed lunch meats. Salami. Fatback. Hotdogs. Bratwurst. Salted nuts and seeds. Canned beans with added salt. Canned or smoked fish. Whole eggs or egg yolks. Chicken or [place] with skin. Dairy Whole or 2% milk, cream, and half-and-half.  Whole or full-fat cream cheese. Whole-fat or sweetened yogurt. Full-fat cheese. Nondairy creamers. Whipped toppings. Processed cheese and cheese spreads. Fats and oils Butter. Stick margarine. Lard. Shortening. Ghee. Bacon fat. Tropical oils, such as coconut, palm kernel, or palm oil. Seasoning and other foods Salted popcorn and pretzels. Onion salt, garlic salt, seasoned salt, table salt, and sea salt. Worcestershire sauce. Tartar sauce. Barbecue sauce. Teriyaki sauce. Soy sauce, including reduced-sodium. Steak sauce. Canned and packaged gravies. Fish sauce. Oyster sauce. Cocktail sauce. Horseradish that you find on the shelf. Ketchup. Mustard. Meat flavorings and tenderizers. Bouillon cubes. Hot sauce and Tabasco sauce. Premade or packaged marinades. Premade or packaged taco seasonings. Relishes. Regular salad dressings. Where to find more information:  National Heart, Lung, and [place]: https://wilson-eaton.com/  American Heart Association: www.heart.org Summary  The DASH eating plan is a healthy eating plan that has been shown to reduce high blood pressure (hypertension). It may also reduce your risk for type 2 diabetes, heart disease, and stroke.  With the DASH eating plan, you should limit salt (sodium) intake to 2,300 mg a day. If you have hypertension, you may need to reduce your sodium intake to 1,500 mg a day.  When on the DASH eating plan, aim to eat more fresh fruits and vegetables, whole grains, lean proteins, low-fat dairy, and heart-healthy  fats.  Work with your health care provider or diet and nutrition specialist (dietitian) to adjust your eating plan to your individual calorie needs. This information is not intended to replace advice given to you by your health care provider. Make sure you discuss any questions you have with your health care provider. Document Released: 05/25/2011 Document Revised: 05/29/2016 Document Reviewed: 05/29/2016 Elsevier Interactive Patient Education   [employer].

## 2017-11-28 NOTE — Assessment & Plan Note (Addendum)
I have personally reviewed the Medicare Annual Wellness questionnaire and have noted 1. The patient's medical and social history 2. Their use of alcohol, tobacco or illicit drugs 3. Their current medications and supplements 4. The patient's functional ability including ADL's, fall risks, home safety risks and hearing or visual             impairment. 5. Diet and physical activities 6. Evidence for depression or mood disorders  The patients weight, height, BMI and visual acuity have been recorded in the chart I have made referrals, counseling and provided education to the patient based review of the above and I have provided the pt with a written personalized care plan for preventive services.  I have provided you with a copy of your personalized plan for preventive services. Please take the time to review along with your updated medication list.  Yearly flu vaccine Discussed shingrix Mammogram due 11/20 Colon fine 2014--doesn't need it (was recalled by GI though) Increasing exercise Pneumovax booster today

## 2017-11-29 ENCOUNTER — Encounter (INDEPENDENT_AMBULATORY_CARE_PROVIDER_SITE_OTHER): Payer: Self-pay | Admitting: Physician Assistant

## 2017-11-29 ENCOUNTER — Ambulatory Visit (INDEPENDENT_AMBULATORY_CARE_PROVIDER_SITE_OTHER): Payer: Medicare Other | Admitting: Physician Assistant

## 2017-11-29 DIAGNOSIS — Z96642 Presence of left artificial hip joint: Secondary | ICD-10-CM

## 2017-11-29 NOTE — Progress Notes (Signed)
HPI:Veronica Stanley returns today 41 days status post left total hip arthroplasty.  She is overall doing very well.  She has had no fever chills shortness of breath chest pain.  She has no concerns.  She is returned back to exercising with an exercise group.  She reports that she does not usually use a cane to ambulate but did not bring it in today with her to the fact she felt that we would "fuss at her" if she did not use it.  Physical exam: General well-developed well-nourished female no acute distress mood affect appropriate.  Psych alert and oriented x3.  Left lower extremity: Surgical incisions healing well no signs of infection.  Good range of motion left hip without pain.  Calf supple nontender.  Good range of motion of the left ankle with dorsiflexion plantarflexion.  She ambulates without any assistive device about the room easily gets up from a sitting position on her own.  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  She will continue to work on strengthening and range of motion of the left hip.  She will follow-up with [name] at 1 year postop and at that time we will obtain AP pelvis and lateral view of the left hip.  She will follow with [name] sooner if there is any questions or concerns.

## 2017-12-03 ENCOUNTER — Ambulatory Visit (INDEPENDENT_AMBULATORY_CARE_PROVIDER_SITE_OTHER): Payer: Medicare Other | Admitting: Orthopaedic Surgery

## 2018-02-07 ENCOUNTER — Telehealth (INDEPENDENT_AMBULATORY_CARE_PROVIDER_SITE_OTHER): Payer: Self-pay | Admitting: Orthopaedic Surgery

## 2018-02-07 NOTE — Telephone Encounter (Signed)
Emailed to provided [employer]

## 2018-02-07 NOTE — Telephone Encounter (Signed)
Faxed to provided number  

## 2018-02-07 NOTE — Telephone Encounter (Signed)
Dr. Magda Paganini [facility] in [place] needs a letter faxed to them explaining patients surgery and protocol as far as needing pre-dental antibiotics. Her surgery was 10/19/17. Please fax letter to [phone]  * she would like it faxed this morning if possible  Their phone # [phone].

## 2018-02-07 NOTE — Telephone Encounter (Signed)
Veronica Stanley at this office left a voicemail saying they have not received letter still and is asking that it is either faxed again to # [phone] or emailed to [email or web address]

## 2018-02-27 ENCOUNTER — Telehealth: Payer: Self-pay | Admitting: Internal Medicine

## 2018-02-27 ENCOUNTER — Telehealth: Payer: Self-pay

## 2018-02-27 DIAGNOSIS — I739 Peripheral vascular disease, unspecified: Secondary | ICD-10-CM

## 2018-02-27 NOTE — Telephone Encounter (Signed)
I am not sure what action should be done based on the study they did Can we get more specifics about what really was done (ABI??)

## 2018-02-27 NOTE — Telephone Encounter (Signed)
Copied from [place] [phone]. Topic: General - Other >> Feb 27, 2018  3:27 PM Yvette Rack wrote: Reason for CRM: Talmage Nap NP with [facility] states pt had mild PAD on circulation test (quantaflo). Please run this by Dr. Silvio Pate to be sure of recent lipid panel and to see if he would need any further intervention. Cb# [phone]

## 2018-02-27 NOTE — Telephone Encounter (Signed)
Copied from [place] [id number]. Topic: Quick Communication - See Telephone Encounter >> Feb 27, 2018 12:07 PM Gardiner Ramus wrote: CRM for notification. See Telephone encounter for: 02/27/18. Talmage Nap (NP) from [facility] called and stated that she would like patients last cholesterol check and what the levels were. Please advise [phone]

## 2018-02-27 NOTE — Telephone Encounter (Signed)
Left a message for Veronica Stanley.Marland KitchenMarland KitchenAs per [name]/[facility] Protocol, they will need to send a request to our HIM Department and they will send them what they need.

## 2018-02-28 DIAGNOSIS — I739 Peripheral vascular disease, unspecified: Secondary | ICD-10-CM | POA: Insufficient documentation

## 2018-02-28 NOTE — Assessment & Plan Note (Signed)
Mildly abnormal screening test on home assessment Unclear clinical signifcance

## 2018-02-28 NOTE — Telephone Encounter (Signed)
I am not sure of the clinical significance of this finding. If she has no pain or limitation in walking, we can defer further evaluation about this till her next routine visit. Check with her and let her know she had a borderline test for circulation to her feet and we should discuss this the next time (unless she has symptoms)

## 2018-02-28 NOTE — Telephone Encounter (Signed)
I spoke to Veronica Stanley. She said the Quantaflo is the [employer] equivalent to a doppler ultrasound. The pt had very mild PAD and that is why they were asking when her last Lipid panel was done (2017)

## 2018-03-04 ENCOUNTER — Other Ambulatory Visit: Payer: Self-pay | Admitting: Internal Medicine

## 2018-03-04 DIAGNOSIS — Z1231 Encounter for screening mammogram for malignant neoplasm of breast: Secondary | ICD-10-CM

## 2018-03-07 NOTE — Telephone Encounter (Signed)
Left detailed message on VM per DPR. 

## 2018-03-13 ENCOUNTER — Encounter: Payer: Self-pay | Admitting: Internal Medicine

## 2018-03-13 ENCOUNTER — Ambulatory Visit: Payer: Medicare Other | Admitting: Internal Medicine

## 2018-03-13 VITALS — BP 118/82 | HR 80 | Temp 98.2°F | Ht 67.0 in | Wt 227.0 lb

## 2018-03-13 DIAGNOSIS — K2100 Gastro-esophageal reflux disease with esophagitis, without bleeding: Secondary | ICD-10-CM

## 2018-03-13 DIAGNOSIS — I10 Essential (primary) hypertension: Secondary | ICD-10-CM | POA: Diagnosis not present

## 2018-03-13 DIAGNOSIS — G479 Sleep disorder, unspecified: Secondary | ICD-10-CM | POA: Diagnosis not present

## 2018-03-13 LAB — LIPID PANEL
CHOLESTEROL: 179 mg/dL (ref 0–200)
HDL: 49.3 mg/dL (ref >39.00)
LDL Cholesterol: 102 mg/dL — ABNORMAL HIGH (ref 0–99)
NonHDL: 129.74
TRIGLYCERIDES: 138 mg/dL (ref 0.0–149.0)
Total CHOL/HDL Ratio: 4
VLDL: 27.6 mg/dL (ref 0.0–40.0)

## 2018-03-13 LAB — GLUCOSE, RANDOM: Glucose, Bld: 96 mg/dL (ref 70–99)

## 2018-03-13 NOTE — Assessment & Plan Note (Signed)
Okay to try to cut back on prilosec to every other day

## 2018-03-13 NOTE — Progress Notes (Signed)
Subjective:    Patient ID: TA FAIR, female    DOB: 08/23/1945, 72 y.o.   MRN: [id number]  HPI Here due to concerns about possible plaque buildup Back at [employer] and walking regularly Interested in getting her cholesterol checked again  No chest pain Is gaining stamina with her walking--but still gets DOE when walking up a hill No history of stroke FH of CAD in dad, paternal uncles  Wonders about the prilosec--read about the side effects Is set up for the colonoscopy--saw GI NP Was told to try every other day  BP remains good Relates it to decreased stress since retiring  Current Outpatient Medications on File Prior to Visit  Medication Sig Dispense Refill  . BLACK CURRANT SEED OIL PO Take by mouth.    . Cholecalciferol (VITAMIN D3) 1000 units CAPS Take 1 capsule by mouth daily.    Marland Kitchen omeprazole (PRILOSEC) 20 MG capsule TAKE ONE CAPSULE BY MOUTH DAILY (Patient taking differently: TAKE ONE CAPSULE BY MOUTH DAILY AS NEEDED FOR HEARTBURN OR INDIGESTION.) 30 capsule 11   No current facility-administered medications on file prior to visit.     Allergies  Allergen Reactions  . Penicillins Anaphylaxis, Shortness Of Breath, Swelling and Rash    Has patient had a PCN reaction causing immediate rash, facial/tongue/throat swelling, SOB or lightheadedness with hypotension: Yes Has patient had a PCN reaction causing severe rash involving mucus membranes or skin necrosis: Yes Has patient had a PCN reaction that required hospitalization: No Has patient had a PCN reaction occurring within the last 10 years: No If all of the above answers are "NO", then may proceed with Cephalosporin use.   . Ibandronate Sodium Other (See Comments)    aches    Past Medical History:  Diagnosis Date  . Basal cell carcinoma, arm 11/13   on arm and face   . Diverticulitis    colon  . GERD (gastroesophageal reflux disease)   . Hypertension    patient denies not on meds   . Menopausal  symptoms   . Osteoarthrosis involving, or with mention of more than one site, but not specified as generalized, multiple sites   . Osteoporosis     Past Surgical History:  Procedure Laterality Date  . ABDOMINAL HYSTERECTOMY    . BREAST BIOPSY Left 2003   Left breast calcifications removed  . DILATION AND CURETTAGE OF UTERUS     rupture- peritonitis & hysterectomy 12/79  . TOTAL HIP ARTHROPLASTY Left 10/19/2017   Procedure: LEFT TOTAL HIP ARTHROPLASTY ANTERIOR APPROACH;  Surgeon: Mcarthur Rossetti, MD;  Location: [facility];  Service: Orthopedics;  Laterality: Left;  Marland Kitchen VAGINAL DELIVERY     x2    Family History  Problem Relation Age of Onset  . Osteoporosis Mother   . Coronary artery disease Father   . Breast cancer Sister 96  . Cancer Maternal Grandfather        colon cancer  . Diabetes Neg Hx   . Hypertension Neg Hx     Social History   Socioeconomic History  . Marital status: Divorced    Spouse name: Not on file  . Number of children: 2  . Years of education: Not on file  . Highest education level: Not on file  Occupational History  . 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 Occupation:    Social Needs  . Financial resource strain: Not on file  . Food insecurity:    Worry: Not  on file    Inability: Not on file  . Transportation needs:    Medical: Not on file    Non-medical: Not on file  Tobacco Use  . Smoking status: Never Smoker  . Smokeless tobacco: Never Used  Substance and Sexual Activity  . Alcohol use: Yes    Comment: occasional  . Drug use: Never  . Sexual activity: Not on file  Lifestyle  . Physical activity:    Days per week: Not on file    Minutes per session: Not on file  . Stress: Not on file  Relationships  . Social connections:    Talks on phone: Not on file    Gets together: Not on file    Attends religious service: Not on file    Active member of club or organization: Not on file    Attends  meetings of clubs or organizations: Not on file    Relationship status: Not on file  . Intimate partner violence:    Fear of current or ex partner: Not on file    Emotionally abused: Not on file    Physically abused: Not on file    Forced sexual activity: Not on file  Other Topics Concern  . Not on file  Social History Narrative   No living will   Would want daughter, Margarita Grizzle,  Then son Ginnie Smart to make health care decisions for her   Would accept resuscitation attempts   Not sure about tube feedings   Review of Systems Weight stable in the past year--but down 10# from 2 years ago Sleep is not great---does snore a lot. No known apnea Some AM grogginess--- some excess daytime somnolence    Objective:   Physical Exam  Constitutional: She appears well-developed. No distress.  Psychiatric: She has a normal mood and affect. Her behavior is normal.           Assessment & Plan:

## 2018-03-13 NOTE — Assessment & Plan Note (Signed)
Has snoring and daytime somnolence Will set up with sleep evaluation

## 2018-03-13 NOTE — Assessment & Plan Note (Signed)
BP Readings from Last 3 Encounters:  03/13/18 118/82  11/28/17 110/76  11/10/17 (!) 160/80   BP good now without Rx She is concerned about atherosclerosis--but has no evidence or symptoms of vascular disease Cholesterol is low though---and low risk profile Discussed uncertainty about primary prevention with statins---will recheck

## 2018-03-14 ENCOUNTER — Encounter: Payer: Self-pay | Admitting: Internal Medicine

## 2018-03-14 ENCOUNTER — Ambulatory Visit (INDEPENDENT_AMBULATORY_CARE_PROVIDER_SITE_OTHER): Payer: Medicare Other | Admitting: Internal Medicine

## 2018-03-14 VITALS — BP 138/72 | HR 78 | Resp 16 | Ht 67.0 in | Wt 230.0 lb

## 2018-03-14 DIAGNOSIS — G4719 Other hypersomnia: Secondary | ICD-10-CM

## 2018-03-14 NOTE — Progress Notes (Signed)
[name]      Assessment and Plan:  Excessive daytime sleepiness.  -Symptoms and signs of obstructive sleep apnea. -We will send for sleep study.  Insomnia.  - Wakes up a few times a night, sometimes have difficulty falling asleep. - This can be contributed to by sleep apnea, will see if this improves with treatment. -Also noted that she is napping during the day for at least 1 hour, advised to avoid this nap to make her more sleepy at bedtime.  Orders Placed This Encounter  Procedures  . Home sleep test   Return in about 3 months (around 06/13/2018).    Date: 03/14/2018  MRN# [id number] Veronica Stanley [birth year]   MALEKA Stanley is a 72 y.o. old female seen in consultation for chief complaint of:    Chief Complaint  Patient presents with  . Consult    Referred by Dr. Silvio Pate for eval of sleep apnea:  . excessilve daytime sleepiness    pt does not sleep well at night. She does have restless sleep.  . Snoring    pt has had episodes of apnea years ago.    HPI:   The patient is a 72 year old female referred for restless sleep.  She usually goes to bed between 10 and 11 PM.  She falls asleep in 30 minutes or more.  She gets out of bed between 6 and 6:30 AM.  Mallampati score is elevated at 9 today. Her problem has been going on for several years. She snores a lot, she has been told. She is sleepy the day, she usually takes a daily nap for about "a good hour". It usually happens in her recliner after lunch.  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   No sleep walking, denies sleep paralysis or cataplexy.  She denies jaw pain, no dentures, no TMJ.    PMHX:   Past Medical History:  Diagnosis Date  . Basal cell carcinoma, arm 11/13   on arm and face   . Diverticulitis    colon  . GERD (gastroesophageal reflux disease)   . Hypertension    patient denies not on meds   . Menopausal symptoms   . Osteoarthrosis involving, or  with mention of more than one site, but not specified as generalized, multiple sites   . Osteoporosis    Surgical Hx:  Past Surgical History:  Procedure Laterality Date  . ABDOMINAL HYSTERECTOMY    . BREAST BIOPSY Left 2003   Left breast calcifications removed  . DILATION AND CURETTAGE OF UTERUS     rupture- peritonitis & hysterectomy 12/79  . TOTAL HIP ARTHROPLASTY Left 10/19/2017   Procedure: LEFT TOTAL HIP ARTHROPLASTY ANTERIOR APPROACH;  Surgeon: Mcarthur Rossetti, MD;  Location: [facility];  Service: Orthopedics;  Laterality: Left;  Marland Kitchen VAGINAL DELIVERY     x2   Family Hx:  Family History  Problem Relation Age of Onset  . Osteoporosis Mother   . Coronary artery disease Father   . Breast cancer Sister 75  . Cancer Maternal Grandfather        colon cancer  . Diabetes Neg Hx   . Hypertension Neg Hx    Social Hx:   Social History   Tobacco Use  . Smoking status: Never Smoker  . Smokeless tobacco: Never Used  Substance Use Topics  . Alcohol use: Yes    Comment: occasional  . Drug use: Never   Medication:  Current Outpatient Medications:  .  BLACK CURRANT SEED OIL PO, Take by mouth., Disp: , Rfl:  .  Cholecalciferol (VITAMIN D3) 1000 units CAPS, Take 1 capsule by mouth daily., Disp: , Rfl:  .  ibuprofen (ADVIL,MOTRIN) 200 MG tablet, Take 200 mg by mouth daily as needed., Disp: , Rfl:  .  omeprazole (PRILOSEC) 20 MG capsule, TAKE ONE CAPSULE BY MOUTH DAILY (Patient taking differently: TAKE ONE CAPSULE BY MOUTH DAILY AS NEEDED FOR HEARTBURN OR INDIGESTION.), Disp: 30 capsule, Rfl: 11 .  TURMERIC PO, Take 900 mg by mouth daily., Disp: , Rfl:    Allergies:  Penicillins and Ibandronate sodium  Review of Systems: Gen:  Denies  fever, sweats, chills HEENT: Denies blurred vision, double vision. bleeds, sore throat Cvc:  No dizziness, chest pain. Resp:   Denies cough or sputum production, shortness of breath Gi: Denies swallowing difficulty, stomach pain. Gu:  Denies  bladder incontinence, burning urine Ext:   No Joint pain, stiffness. Skin: No skin rash,  hives  Endoc:  No polyuria, polydipsia. Psych: No depression, insomnia. Other:  All other systems were reviewed with the patient and were negative other that what is mentioned in the HPI.   Physical Examination:   VS: BP 138/72 (BP Location: Left Arm, Cuff Size: Large)   Pulse 78   Resp 16   Ht 5\' 7"  (1.702 m)   Wt 230 lb (104.3 kg)   SpO2 95%   BMI 36.02 kg/m   General Appearance: No distress  Neuro:without focal findings,  speech normal,  HEENT: PERRLA, EOM intact.   Pulmonary: normal breath sounds, No wheezing.  CardiovascularNormal S1,S2.  No m/r/g.   Abdomen: Benign, Soft, non-tender. Renal:  No costovertebral tenderness  GU:  No performed at this time. Endoc: No evident thyromegaly, no signs of acromegaly. Skin:   warm, no rashes, no ecchymosis  Extremities: normal, no cyanosis, clubbing.  Other findings:    LABORATORY PANEL:   CBC No results for input(s): WBC, HGB, HCT, PLT in the last 168 hours. ------------------------------------------------------------------------------------------------------------------  Chemistries  Recent Labs  Lab 03/13/18 1223  GLUCOSE 96   ------------------------------------------------------------------------------------------------------------------  Cardiac Enzymes No results for input(s): TROPONINI in the last 168 hours. ------------------------------------------------------------  RADIOLOGY:  No results found.     Thank  you for the consultation and for allowing [facility] to assist in the care of your patient. Our recommendations are noted above.  Please contact us if we can be of further service.   [name], M.D., F.C.C.P.  Board Certified in Internal Medicine, Pulmonary Medicine, [place], and Sleep Medicine.  [facility] Office Number:  [phone]   03/14/2018

## 2018-03-14 NOTE — Patient Instructions (Addendum)
Will send for sleep study.  Avoid napping during the day.    Sleep Apnea    Sleep apnea is disorder that affects a person's sleep. A person with sleep apnea has abnormal pauses in their breathing when they sleep. It is hard for them to get a good sleep. This makes a person tired during the day. It also can lead to other physical problems. There are three types of sleep apnea. One type is when breathing stops for a short time because your airway is blocked (obstructive sleep apnea). Another type is when the brain sometimes fails to give the normal signal to breathe to the muscles that control your breathing (central sleep apnea). The third type is a combination of the other two types.  HOME CARE   Take all medicine as told by your doctor.  Avoid alcohol, calming medicines (sedatives), and depressant drugs.  Try to lose weight if you are overweight. Talk to your doctor about a healthy weight goal.  Your doctor may have you use a device that helps to open your airway. It can help you get the air that you need. It is called a positive airway pressure (PAP) device.   MAKE SURE YOU:   Understand these instructions.  Will watch your condition.  Will get help right away if you are not doing well or get worse.  It may take approximately 1 month for you to get used to wearing her CPAP every night.  Be sure to work with your machine to get used to it, be patient, it may take time!  If you have trouble tolerating CPAP DO NOT RETURN YOUR MACHINE; Contact our office to see if we can help you tolerate the CPAP better first!

## 2018-03-25 ENCOUNTER — Encounter: Payer: Self-pay | Admitting: Internal Medicine

## 2018-03-25 DIAGNOSIS — G4719 Other hypersomnia: Secondary | ICD-10-CM

## 2018-03-25 DIAGNOSIS — G4733 Obstructive sleep apnea (adult) (pediatric): Secondary | ICD-10-CM

## 2018-03-28 ENCOUNTER — Telehealth: Payer: Self-pay | Admitting: *Deleted

## 2018-03-28 DIAGNOSIS — G4733 Obstructive sleep apnea (adult) (pediatric): Secondary | ICD-10-CM | POA: Diagnosis not present

## 2018-03-28 NOTE — Telephone Encounter (Signed)
Pt aware Orders placed Nothing further needed. 

## 2018-04-17 ENCOUNTER — Encounter: Payer: Self-pay | Admitting: Internal Medicine

## 2018-04-17 ENCOUNTER — Ambulatory Visit: Payer: Medicare Other | Admitting: Internal Medicine

## 2018-04-17 VITALS — BP 132/84 | HR 65 | Temp 97.9°F | Ht 67.0 in | Wt 231.0 lb

## 2018-04-17 DIAGNOSIS — G4733 Obstructive sleep apnea (adult) (pediatric): Secondary | ICD-10-CM | POA: Diagnosis not present

## 2018-04-17 NOTE — Assessment & Plan Note (Signed)
Reviewed her home sleep study which seems to have been good quality AHI was 47.7 Discussed that the test seems to be reliable Fairly severe sleep apnea Discussed this for the entire 12 minute visit  She will proceed to get the CPAP today

## 2018-04-17 NOTE — Progress Notes (Signed)
Subjective:    Patient ID: Veronica Stanley, female    DOB: 13-Jun-1946, 72 y.o.   MRN: [id number]  HPI Here to review the sleep study Did see Dr Ashby Dawes and had home sleep study She thinks she didn't do the test right at home  She set up the chest monitor Nose plugs, etc were in--she wonders if she put them on right The lights were lit--not yellow except for when she took off finger oximeter Thinks she didn't stay on her back--would roll onto her side and lights were still green She felt like she was awake a lot of the time  Current Outpatient Medications on File Prior to Visit  Medication Sig Dispense Refill  . BLACK CURRANT SEED OIL PO Take by mouth.    . Cholecalciferol (VITAMIN D3) 1000 units CAPS Take 1 capsule by mouth daily.    Marland Kitchen ibuprofen (ADVIL,MOTRIN) 200 MG tablet Take 200 mg by mouth daily as needed.    Marland Kitchen omeprazole (PRILOSEC) 20 MG capsule TAKE ONE CAPSULE BY MOUTH DAILY (Patient taking differently: TAKE ONE CAPSULE BY MOUTH DAILY AS NEEDED FOR HEARTBURN OR INDIGESTION.) 30 capsule 11  . TURMERIC PO Take 900 mg by mouth daily.     No current facility-administered medications on file prior to visit.     Allergies  Allergen Reactions  . Penicillins Anaphylaxis, Shortness Of Breath, Swelling and Rash    Has patient had a PCN reaction causing immediate rash, facial/tongue/throat swelling, SOB or lightheadedness with hypotension: Yes Has patient had a PCN reaction causing severe rash involving mucus membranes or skin necrosis: Yes Has patient had a PCN reaction that required hospitalization: No Has patient had a PCN reaction occurring within the last 10 years: No If all of the above answers are "NO", then may proceed with Cephalosporin use.   . Ibandronate Sodium Other (See Comments)    aches    Past Medical History:  Diagnosis Date  . Basal cell carcinoma, arm 11/13   on arm and face   . Diverticulitis    colon  . GERD (gastroesophageal reflux disease)   .  Hypertension    patient denies not on meds   . Menopausal symptoms   . Osteoarthrosis involving, or with mention of more than one site, but not specified as generalized, multiple sites   . Osteoporosis     Past Surgical History:  Procedure Laterality Date  . ABDOMINAL HYSTERECTOMY    . BREAST BIOPSY Left 2003   Left breast calcifications removed  . DILATION AND CURETTAGE OF UTERUS     rupture- peritonitis & hysterectomy 12/79  . TOTAL HIP ARTHROPLASTY Left 10/19/2017   Procedure: LEFT TOTAL HIP ARTHROPLASTY ANTERIOR APPROACH;  Surgeon: Mcarthur Rossetti, MD;  Location: [facility];  Service: Orthopedics;  Laterality: Left;  Marland Kitchen VAGINAL DELIVERY     x2    Family History  Problem Relation Age of Onset  . Osteoporosis Mother   . Coronary artery disease Father   . Breast cancer Sister 87  . Cancer Maternal Grandfather        colon cancer  . Diabetes Neg Hx   . Hypertension Neg Hx     Social History   Socioeconomic History  . Marital status: Divorced    Spouse name: Not on file  . Number of children: 2  . Years of education: Not on file  . Highest education level: Not on file  Occupational History  . 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 Occupation:    Social Needs  . Financial resource strain: Not on file  . Food insecurity:    Worry: Not on file    Inability: Not on file  . Transportation needs:    Medical: Not on file    Non-medical: Not on file  Tobacco Use  . Smoking status: Never Smoker  . Smokeless tobacco: Never Used  Substance and Sexual Activity  . Alcohol use: Yes    Comment: occasional  . Drug use: Never  . Sexual activity: Not on file  Lifestyle  . Physical activity:    Days per week: Not on file    Minutes per session: Not on file  . Stress: Not on file  Relationships  . Social connections:    Talks on phone: Not on file    Gets together: Not on file    Attends religious service: Not on file    Active  member of club or organization: Not on file    Attends meetings of clubs or organizations: Not on file    Relationship status: Not on file  . Intimate partner violence:    Fear of current or ex partner: Not on file    Emotionally abused: Not on file    Physically abused: Not on file    Forced sexual activity: Not on file  Other Topics Concern  . Not on file  Social History Narrative   No living will   Would want daughter, Veronica Stanley,  Then son Veronica Stanley to make health care decisions for her   Would accept resuscitation attempts   Not sure about tube feedings   Review of Systems     Objective:   Physical Exam  Psychiatric: She has a normal mood and affect. Her behavior is normal.           Assessment & Plan:

## 2018-04-19 ENCOUNTER — Encounter: Payer: Self-pay | Admitting: *Deleted

## 2018-04-22 ENCOUNTER — Encounter: Payer: Self-pay | Admitting: *Deleted

## 2018-04-22 ENCOUNTER — Ambulatory Visit: Payer: Medicare Other | Admitting: Anesthesiology

## 2018-04-22 ENCOUNTER — Encounter
Admission: RE | Disposition: A | Discharge status: Home / Self Care | Payer: Self-pay | Source: Ambulatory Visit | Attending: Unknown Physician Specialty

## 2018-04-22 ENCOUNTER — Other Ambulatory Visit: Payer: Self-pay

## 2018-04-22 ENCOUNTER — Ambulatory Visit
Admission: RE | Admit: 2018-04-22 | Discharge: 2018-04-22 | Disposition: A | Discharge status: Home / Self Care | Payer: Medicare Other | Source: Ambulatory Visit | Attending: Unknown Physician Specialty | Admitting: Unknown Physician Specialty

## 2018-04-22 DIAGNOSIS — Z8262 Family history of osteoporosis: Secondary | ICD-10-CM | POA: Diagnosis not present

## 2018-04-22 DIAGNOSIS — Z90710 Acquired absence of both cervix and uterus: Secondary | ICD-10-CM | POA: Insufficient documentation

## 2018-04-22 DIAGNOSIS — M1990 Unspecified osteoarthritis, unspecified site: Secondary | ICD-10-CM | POA: Diagnosis not present

## 2018-04-22 DIAGNOSIS — Z800 Family history of malignant neoplasm of digestive organs: Secondary | ICD-10-CM | POA: Diagnosis not present

## 2018-04-22 DIAGNOSIS — K219 Gastro-esophageal reflux disease without esophagitis: Secondary | ICD-10-CM | POA: Insufficient documentation

## 2018-04-22 DIAGNOSIS — Z1211 Encounter for screening for malignant neoplasm of colon: Secondary | ICD-10-CM | POA: Insufficient documentation

## 2018-04-22 DIAGNOSIS — Z96642 Presence of left artificial hip joint: Secondary | ICD-10-CM | POA: Diagnosis not present

## 2018-04-22 DIAGNOSIS — Z85828 Personal history of other malignant neoplasm of skin: Secondary | ICD-10-CM | POA: Diagnosis not present

## 2018-04-22 DIAGNOSIS — Z803 Family history of malignant neoplasm of breast: Secondary | ICD-10-CM | POA: Diagnosis not present

## 2018-04-22 DIAGNOSIS — I739 Peripheral vascular disease, unspecified: Secondary | ICD-10-CM | POA: Diagnosis not present

## 2018-04-22 DIAGNOSIS — I10 Essential (primary) hypertension: Secondary | ICD-10-CM | POA: Diagnosis not present

## 2018-04-22 DIAGNOSIS — G4730 Sleep apnea, unspecified: Secondary | ICD-10-CM | POA: Insufficient documentation

## 2018-04-22 DIAGNOSIS — K640 First degree hemorrhoids: Secondary | ICD-10-CM | POA: Diagnosis not present

## 2018-04-22 DIAGNOSIS — K5730 Diverticulosis of large intestine without perforation or abscess without bleeding: Secondary | ICD-10-CM | POA: Diagnosis not present

## 2018-04-22 DIAGNOSIS — Z79899 Other long term (current) drug therapy: Secondary | ICD-10-CM | POA: Diagnosis not present

## 2018-04-22 DIAGNOSIS — Z8371 Family history of colonic polyps: Secondary | ICD-10-CM | POA: Diagnosis not present

## 2018-04-22 DIAGNOSIS — Z8249 Family history of ischemic heart disease and other diseases of the circulatory system: Secondary | ICD-10-CM | POA: Insufficient documentation

## 2018-04-22 HISTORY — DX: Peripheral vascular disease, unspecified: I73.9

## 2018-04-22 HISTORY — PX: COLONOSCOPY WITH PROPOFOL: SHX5780

## 2018-04-22 SURGERY — COLONOSCOPY WITH PROPOFOL
Anesthesia: General

## 2018-04-22 MED ORDER — GENTAMICIN SULFATE 40 MG/ML IJ SOLN
100.0000 mg | Freq: Once | INTRAVENOUS | Status: AC
  Administered 2018-04-22: 100 mg via INTRAVENOUS
  Filled 2018-04-22: qty 2.5

## 2018-04-22 MED ORDER — PROPOFOL 10 MG/ML IV BOLUS
INTRAVENOUS | Status: DC | PRN
  Administered 2018-04-22: 10 mg via INTRAVENOUS
  Administered 2018-04-22 (×2): 40 mg via INTRAVENOUS

## 2018-04-22 MED ORDER — LIDOCAINE HCL (CARDIAC) PF 100 MG/5ML IV SOSY
PREFILLED_SYRINGE | INTRAVENOUS | Status: DC | PRN
  Administered 2018-04-22: 50 mg via INTRAVENOUS

## 2018-04-22 MED ORDER — VANCOMYCIN HCL IN DEXTROSE 1-5 GM/200ML-% IV SOLN
INTRAVENOUS | Status: AC
  Filled 2018-04-22: qty 200

## 2018-04-22 MED ORDER — LIDOCAINE HCL (PF) 2 % IJ SOLN
INTRAMUSCULAR | Status: AC
  Filled 2018-04-22: qty 10

## 2018-04-22 MED ORDER — SODIUM CHLORIDE 0.9 % IV SOLN
INTRAVENOUS | Status: DC
  Administered 2018-04-22: 13:00:00 via INTRAVENOUS

## 2018-04-22 MED ORDER — PROPOFOL 500 MG/50ML IV EMUL
INTRAVENOUS | Status: AC
  Filled 2018-04-22: qty 50

## 2018-04-22 MED ORDER — PROPOFOL 500 MG/50ML IV EMUL
INTRAVENOUS | Status: DC | PRN
  Administered 2018-04-22: 150 ug/kg/min via INTRAVENOUS

## 2018-04-22 MED ORDER — VANCOMYCIN HCL IN DEXTROSE 1-5 GM/200ML-% IV SOLN: 1000.0000 mg | Freq: Once | INTRAVENOUS | Status: DC

## 2018-04-22 MED ORDER — GENTAMICIN IN SALINE 1-0.9 MG/ML-% IV SOLN
100.0000 mg | Freq: Once | INTRAVENOUS | Status: DC
  Filled 2018-04-22: qty 100

## 2018-04-22 NOTE — Transfer of Care (Signed)
Immediate Anesthesia Transfer of Care Note  Patient: Veronica Stanley  Procedure(s) Performed: COLONOSCOPY WITH PROPOFOL (N/A )  Patient Location: PACU  Anesthesia Type:General  Level of Consciousness: awake  Airway & Oxygen Therapy: Patient Spontanous Breathing and Patient connected to nasal cannula oxygen  Post-op Assessment: Report given to RN and Post -op Vital signs reviewed and stable  Post vital signs: Reviewed and stable  Last Vitals:  Vitals Value Taken Time  BP 135/73 04/22/2018  3:22 PM  Temp 35.8 C 04/22/2018  3:22 PM  Pulse 73 04/22/2018  3:22 PM  Resp 14 04/22/2018  3:22 PM  SpO2 99 % 04/22/2018  3:22 PM    Last Pain:  Vitals:   04/22/18 1522  TempSrc: Tympanic  PainSc: 0-No pain         Complications: No apparent anesthesia complications

## 2018-04-22 NOTE — Op Note (Signed)
[facility] Gastroenterology Patient Name: Veronica Stanley Procedure Date: 04/22/2018 2:33 PM MRN: [id number] Account #: [id number] Date of Birth: Apr 09, 1946 Admit Type: Outpatient Age: 72 Room: [facility] ENDO ROOM 1 Gender: Female Note Status: Finalized Procedure:            Colonoscopy Indications:          Colon cancer screening in patient at increased risk:                        Family history of 1st-degree relative with colon polyps Providers:            Manya Silvas, MD Referring MD:         Venia Carbon (Referring MD) Medicines:            Propofol per Anesthesia Complications:        No immediate complications. Procedure:            Pre-Anesthesia Assessment:                       - After reviewing the risks and benefits, the patient                        was deemed in satisfactory condition to undergo the                        procedure.                       After obtaining informed consent, the colonoscope was                        passed under direct vision. Throughout the procedure,                        the patient's blood pressure, pulse, and oxygen                        saturations were monitored continuously. The                        Colonoscope was introduced through the anus and                        advanced to the the cecum, identified by appendiceal                        orifice and ileocecal valve. The colonoscopy was                        somewhat difficult due to restricted mobility of the                        colon. Successful completion of the procedure was aided                        by applying abdominal pressure. The patient tolerated                        the procedure well. The quality of the bowel  preparation was excellent. Findings:      Multiple small-mouthed diverticula were found in the sigmoid colon.      Internal hemorrhoids were found during endoscopy. The hemorrhoids were       small  and Grade I (internal hemorrhoids that do not prolapse).      The exam was otherwise without abnormality. Impression:           - Diverticulosis in the sigmoid colon.                       - Internal hemorrhoids.                       - The examination was otherwise normal.                       - No specimens collected. Recommendation:       - Repeat colonoscopy in 5 years for surveillance. Manya Silvas, MD 04/22/2018 3:21:23 PM This report has been signed electronically. Number of Addenda: 0 Note Initiated On: 04/22/2018 2:33 PM Scope Withdrawal Time: 0 hours 5 minutes 17 seconds  Total Procedure Duration: 0 hours 17 minutes 33 seconds       [facility]

## 2018-04-22 NOTE — Anesthesia Postprocedure Evaluation (Signed)
Anesthesia Post Note  Patient: Jesyka I Strohmeyer  Procedure(s) Performed: COLONOSCOPY WITH PROPOFOL (N/A )  Patient location during evaluation: Endoscopy Anesthesia Type: General Level of consciousness: awake and alert Pain management: pain level controlled Vital Signs Assessment: post-procedure vital signs reviewed and stable Respiratory status: spontaneous breathing, nonlabored ventilation, respiratory function stable and patient connected to nasal cannula oxygen Cardiovascular status: blood pressure returned to baseline and stable Postop Assessment: no apparent nausea or vomiting Anesthetic complications: no     Last Vitals:  Vitals:   04/22/18 1532 04/22/18 1542  BP: (!) 152/84 (!) 158/79  Pulse: 68 70  Resp: 16 15  Temp:    SpO2: 95% 99%    Last Pain:  Vitals:   04/22/18 1542  TempSrc:   PainSc: 0-No pain                 Precious Haws Piscitello

## 2018-04-22 NOTE — H&P (Signed)
Primary Care Physician:  Venia Carbon, MD Primary Gastroenterologist:  Dr. Vira Agar  Pre-Procedure History & Physical: HPI:  Veronica Stanley is a 73 y.o. female is here for an colonoscopy.   Past Medical History:  Diagnosis Date  . Basal cell carcinoma, arm 11/13   on arm and face   . Diverticulitis    colon  . GERD (gastroesophageal reflux disease)   . Hypertension    patient denies not on meds   . Menopausal symptoms   . Osteoarthrosis involving, or with mention of more than one site, but not specified as generalized, multiple sites   . Osteoporosis   . Peripheral vascular disease [facility])     Past Surgical History:  Procedure Laterality Date  . ABDOMINAL HYSTERECTOMY    . BREAST BIOPSY Left 2003   Left breast calcifications removed  . DILATION AND CURETTAGE OF UTERUS     rupture- peritonitis & hysterectomy 12/79  . TOTAL HIP ARTHROPLASTY Left 10/19/2017   Procedure: LEFT TOTAL HIP ARTHROPLASTY ANTERIOR APPROACH;  Surgeon: Mcarthur Rossetti, MD;  Location: [facility];  Service: Orthopedics;  Laterality: Left;  Marland Kitchen VAGINAL DELIVERY     x2    Prior to Admission medications   Medication Sig Start Date End Date Taking? Authorizing Provider  BLACK CURRANT SEED OIL PO Take by mouth.   Yes [provider]  Cholecalciferol (VITAMIN D3) 1000 units CAPS Take 1 capsule by mouth daily.   Yes [provider]  ibuprofen (ADVIL,MOTRIN) 200 MG tablet Take 200 mg by mouth daily as needed.   Yes [provider]  omeprazole (PRILOSEC) 20 MG capsule TAKE ONE CAPSULE BY MOUTH DAILY Patient taking differently: TAKE ONE CAPSULE BY MOUTH DAILY AS NEEDED FOR HEARTBURN OR INDIGESTION. 04/16/17  Yes Viviana Simpler I, MD  TURMERIC PO Take 900 mg by mouth daily.   Yes [provider]    Allergies as of 04/08/2018 - Review Complete 03/14/2018  Allergen Reaction Noted  . Penicillins Anaphylaxis, Shortness Of Breath, Swelling, and Rash 01/14/2007  . Ibandronate  sodium Other (See Comments) 04/17/2007    Family History  Problem Relation Age of Onset  . Osteoporosis Mother   . Coronary artery disease Father   . Breast cancer Sister 57  . Cancer Maternal Grandfather        colon cancer  . Diabetes Neg Hx   . Hypertension Neg Hx     Social History   Socioeconomic History  . Marital status: Divorced    Spouse name: Not on file  . Number of children: 2  . Years of education: Not on file  . Highest education level: Not on file  Occupational History  . 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 Occupation:    Social Needs  . Financial resource strain: Not on file  . Food insecurity:    Worry: Not on file    Inability: Not on file  . Transportation needs:    Medical: Not on file    Non-medical: Not on file  Tobacco Use  . Smoking status: Never Smoker  . Smokeless tobacco: Never Used  Substance and Sexual Activity  . Alcohol use: Yes    Alcohol/week: 3.0 standard drinks    Types: 3 Glasses of wine per week  . Drug use: Never  . Sexual activity: Not on file  Lifestyle  . Physical activity:    Days per week: Not on file    Minutes per session:  Not on file  . Stress: Not on file  Relationships  . Social connections:    Talks on phone: Not on file    Gets together: Not on file    Attends religious service: Not on file    Active member of club or organization: Not on file    Attends meetings of clubs or organizations: Not on file    Relationship status: Not on file  . Intimate partner violence:    Fear of current or ex partner: Not on file    Emotionally abused: Not on file    Physically abused: Not on file    Forced sexual activity: Not on file  Other Topics Concern  . Not on file  Social History Narrative   No living will   Would want daughter, Margarita Grizzle,  Then son Ginnie Smart to make health care decisions for her   Would accept resuscitation attempts   Not sure about tube feedings    Review  of Systems: See HPI, otherwise negative ROS  Physical Exam: BP (!) 147/92   Pulse 77   Temp 98 F (36.7 C) (Tympanic)   Resp 18   Ht 5\' 7"  (1.702 m)   Wt 104.3 kg   LMP  (LMP Unknown)   SpO2 100%   BMI 36.02 kg/m  General:   Alert,  pleasant and cooperative in NAD Head:  Normocephalic and atraumatic. Neck:  Supple; no masses or thyromegaly. Lungs:  Clear throughout to auscultation.    Heart:  Regular rate and rhythm. Abdomen:  Soft, nontender and nondistended. Normal bowel sounds, without guarding, and without rebound.   Neurologic:  Alert and  oriented x4;  grossly normal neurologically.  Impression/Plan: Veronica Stanley is here for an colonoscopy to be performed for Family history of colon polyps.  Patient got pre op antibiotics due to artifical hip. Last colonoscopy was 02/24/2013.  Risks, benefits, limitations, and alternatives regarding  colonoscopy have been reviewed with the patient.  Questions have been answered.  All parties agreeable.   Gaylyn Cheers, MD  04/22/2018, 2:46 PM

## 2018-04-22 NOTE — Anesthesia Preprocedure Evaluation (Signed)
Anesthesia Evaluation  Patient identified by MRN, date of birth, ID band Patient awake    Reviewed: Allergy & Precautions, H&P , NPO status , Patient's Chart, lab work & pertinent test results  History of Anesthesia Complications Negative for: history of anesthetic complications  Airway Mallampati: III  TM Distance: <3 FB Neck ROM: limited    Dental  (+) Chipped   Pulmonary sleep apnea and Continuous Positive Airway Pressure Ventilation ,           Cardiovascular Exercise Tolerance: Good hypertension, (-) angina+ Peripheral Vascular Disease  (-) Past MI and (-) DOE      Neuro/Psych negative neurological ROS  negative psych ROS   GI/Hepatic Neg liver ROS, GERD  ,  Endo/Other  negative endocrine ROS  Renal/GU negative Renal ROS  negative genitourinary   Musculoskeletal  (+) Arthritis ,   Abdominal   Peds  Hematology negative hematology ROS (+)   Anesthesia Other Findings Past Medical History: 11/13: Basal cell carcinoma, arm     Comment:  on arm and face  No date: Diverticulitis     Comment:  colon No date: GERD (gastroesophageal reflux disease) No date: Hypertension     Comment:  patient denies not on meds  No date: Menopausal symptoms No date: Osteoarthrosis involving, or with mention of more than one  site, but not specified as generalized, multiple sites No date: Osteoporosis No date: Peripheral vascular disease ([place])  Past Surgical History: No date: ABDOMINAL HYSTERECTOMY 2003: BREAST BIOPSY; Left     Comment:  Left breast calcifications removed No date: DILATION AND CURETTAGE OF UTERUS     Comment:  rupture- peritonitis & hysterectomy 12/79 10/19/2017: TOTAL HIP ARTHROPLASTY; Left     Comment:  Procedure: LEFT TOTAL HIP ARTHROPLASTY ANTERIOR               APPROACH;  Surgeon: Mcarthur Rossetti, MD;                Location: [facility];  Service: Orthopedics;  Laterality:               Left; No  date: VAGINAL DELIVERY     Comment:  x2  BMI    Body Mass Index:  36.02 kg/m      Reproductive/Obstetrics negative OB ROS                             Anesthesia Physical Anesthesia Plan  ASA: III  Anesthesia Plan: General   Post-op Pain Management:    Induction: Intravenous  PONV Risk Score and Plan: Propofol infusion and TIVA  Airway Management Planned: Natural Airway and Nasal Cannula  Additional Equipment:   Intra-op Plan:   Post-operative Plan:   Informed Consent: I have reviewed the patients History and Physical, chart, labs and discussed the procedure including the risks, benefits and alternatives for the proposed anesthesia with the patient or authorized representative who has indicated his/her understanding and acceptance.   Dental Advisory Given  Plan Discussed with: Anesthesiologist, CRNA and Surgeon  Anesthesia Plan Comments: (Patient consented for risks of anesthesia including but not limited to:  - adverse reactions to medications - risk of intubation if required - damage to teeth, lips or other oral mucosa - sore throat or hoarseness - Damage to heart, brain, lungs or loss of life  Patient voiced understanding.)        Anesthesia Quick Evaluation

## 2018-04-22 NOTE — Anesthesia Procedure Notes (Signed)
Date/Time: 04/22/2018 2:55 PM Performed by: Johnna Acosta, CRNA Pre-anesthesia Checklist: Patient identified, Emergency Drugs available, Suction available, Patient being monitored and Timeout performed Patient Re-evaluated:Patient Re-evaluated prior to induction Oxygen Delivery Method: Nasal cannula Preoxygenation: Pre-oxygenation with 100% oxygen

## 2018-04-22 NOTE — Anesthesia Post-op Follow-up Note (Signed)
Anesthesia QCDR form completed.        

## 2018-04-23 ENCOUNTER — Encounter: Payer: Self-pay | Admitting: Unknown Physician Specialty

## 2018-05-01 ENCOUNTER — Ambulatory Visit
Admission: RE | Admit: 2018-05-01 | Discharge: 2018-05-01 | Disposition: A | Discharge status: Home / Self Care | Payer: Medicare Other | Source: Ambulatory Visit | Attending: Internal Medicine | Admitting: Internal Medicine

## 2018-05-01 DIAGNOSIS — Z1231 Encounter for screening mammogram for malignant neoplasm of breast: Secondary | ICD-10-CM | POA: Insufficient documentation

## 2018-09-09 ENCOUNTER — Telehealth: Payer: Self-pay | Admitting: Internal Medicine

## 2018-09-09 NOTE — Telephone Encounter (Signed)
Pt has been scheduled for phone visit on 09/10/18 at 8:30 for cpap compliance.  Nothing further is needed.

## 2018-09-10 ENCOUNTER — Other Ambulatory Visit: Payer: Self-pay

## 2018-09-10 ENCOUNTER — Ambulatory Visit (INDEPENDENT_AMBULATORY_CARE_PROVIDER_SITE_OTHER): Payer: Medicare Other | Admitting: Internal Medicine

## 2018-09-10 DIAGNOSIS — G4733 Obstructive sleep apnea (adult) (pediatric): Secondary | ICD-10-CM

## 2018-09-10 NOTE — Progress Notes (Signed)
[facility]      Virtual Visit via Telephone Note I connected with pt on 09/10/18 at  8:30 AM EDT by telephone and verified that I am speaking with the correct person using two identifiers.   I discussed the limitations, risks, security and privacy concerns of performing an evaluation and management service by telephone and the availability of in person appointments. I also discussed with the patient that there may be a patient responsible charge related to this service. The patient expressed understanding and agreed to proceed. I discussed the assessment and treatment plan with the patient. The patient was provided an opportunity to ask questions and all were answered. The patient agreed with the plan and demonstrated an understanding of the instructions. Please see note below for further detail.    The patient was advised to call back or seek an in-person evaluation if the symptoms worsen or if the condition fails to improve as anticipated.  I provided 25 minutes of non-face-to-face time during this encounter.   Laverle Hobby, MD    Assessment and Plan:  Obstructive sleep apnea.  -Severe obstructive sleep apnea with AHI of 48, now well controlled with auto CPAP. -Continues to have moderate mask leak and would like to consider getting a different mask.  Asked her to call us back in about 2 months once the coronavirus restrictions have decreased, and we can consider referring her to mask fitting clinic.  Insomnia.  - This is considerably improved since starting CPAP.   Return in about 1 year (around 09/10/2019).    Date: 09/10/2018  MRN# [id number] Veronica Stanley 1945/09/14   Veronica Stanley is a 73 y.o. old female seen in consultation for chief complaint of: sleep apnea.    HPI:   The patient is a 73 year old female diagnosed with severe OSA with AHI of 48. She has been started on CPAP, she is doing well with it and using it every night. She feels that  she is doing well, she is more awake during the day, she is no longer snoring.   No sleep walking, denies sleep paralysis or cataplexy.  She denies jaw pain, no dentures, no TMJ.   **CPAP download 08/10/2018-09/08/2018>> raw data personally reviewed.  Usage greater than 4 hours is 30/30 days.  Average usage on days used is 6 hours 31 minutes.  Pressure ranges 5-20.  Median pressure is 9.4, 95th percentile pressure is 12, maximum pressure is 14.  Leaks are elevated.  Residual apnea index is 3.4.  Overall this shows excellent compliance with excellent control of obstructive sleep apnea.  Leaks are mildly elevated. **HST 03/25/2018>> AHI is 48.  Recommended auto CPAP with pressure range 5-20.  Medication:    Current Outpatient Medications:  .  BLACK CURRANT SEED OIL PO, Take by mouth., Disp: , Rfl:  .  Cholecalciferol (VITAMIN D3) 1000 units CAPS, Take 1 capsule by mouth daily., Disp: , Rfl:  .  ibuprofen (ADVIL,MOTRIN) 200 MG tablet, Take 200 mg by mouth daily as needed., Disp: , Rfl:  .  omeprazole (PRILOSEC) 20 MG capsule, TAKE ONE CAPSULE BY MOUTH DAILY (Patient taking differently: TAKE ONE CAPSULE BY MOUTH DAILY AS NEEDED FOR HEARTBURN OR INDIGESTION.), Disp: 30 capsule, Rfl: 11 .  TURMERIC PO, Take 900 mg by mouth daily., Disp: , Rfl:    Allergies:  Penicillins and Ibandronate sodium  Review of Systems:  Constitutional: Feels well. Cardiovascular: Denies chest pain, exertional chest pain.  Pulmonary: Denies hemoptysis, pleuritic chest pain.  The remainder of systems were reviewed and were found to be negative other than what is documented in the HPI.        LABORATORY PANEL:   CBC No results for input(s): WBC, HGB, HCT, PLT in the last 168 hours. ------------------------------------------------------------------------------------------------------------------  Chemistries  No results for input(s): NA, K, CL, CO2, GLUCOSE, BUN, CREATININE, CALCIUM, MG, AST, ALT, ALKPHOS, BILITOT  in the last 168 hours.  Invalid input(s): GFRCGP ------------------------------------------------------------------------------------------------------------------  Cardiac Enzymes No results for input(s): TROPONINI in the last 168 hours. ------------------------------------------------------------  RADIOLOGY:  No results found.     Thank  you for the consultation and for allowing [facility] to assist in the care of your patient. Our recommendations are noted above.  Please contact us if we can be of further service.   [name], M.D., F.C.C.P.  Board Certified in Internal Medicine, Pulmonary Medicine, [place], and Sleep Medicine.  [facility] Office Number: [phone]   09/10/2018

## 2018-09-10 NOTE — Patient Instructions (Signed)
Call us back in a couple months after coronavirus restrictions have cleared and we can refer you to mask fitting clinic.

## 2018-09-14 ENCOUNTER — Other Ambulatory Visit: Payer: Self-pay | Admitting: Internal Medicine

## 2018-12-02 ENCOUNTER — Encounter: Payer: Self-pay | Admitting: Physician Assistant

## 2018-12-02 ENCOUNTER — Ambulatory Visit (INDEPENDENT_AMBULATORY_CARE_PROVIDER_SITE_OTHER): Payer: Medicare Other | Admitting: Physician Assistant

## 2018-12-02 ENCOUNTER — Other Ambulatory Visit: Payer: Self-pay

## 2018-12-02 ENCOUNTER — Ambulatory Visit: Payer: Self-pay

## 2018-12-02 DIAGNOSIS — Z96642 Presence of left artificial hip joint: Secondary | ICD-10-CM

## 2018-12-02 DIAGNOSIS — M1711 Unilateral primary osteoarthritis, right knee: Secondary | ICD-10-CM | POA: Diagnosis not present

## 2018-12-02 NOTE — Progress Notes (Signed)
Office Visit Note   Patient: Veronica Stanley           Date of Birth: June 25, 1945           MRN: [id number] Visit Date: 12/02/2018              Requested by: Venia Carbon, MD [place],  [place] [id number] PCP: Venia Carbon, MD   Assessment & Plan: Visit Diagnoses:  1. History of left hip replacement   2. Primary osteoarthritis of right knee     Plan: After discussing the radiographs of the right knee with her and the fact that she did have small effusion on the knee.  Offered aspiration with or without cortisone injection she defers at this point time.  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  She will follow-up with [name] on as-needed basis.  In regards to her left hip she will follow-up with [name] as needed.  Questions were encouraged and answered.  Follow-Up Instructions: Return if symptoms worsen or fail to improve.   Orders:  Orders Placed This Encounter  Procedures  . XR HIP UNILAT W OR W/O PELVIS 1V LEFT  . XR Knee 1-2 Views Right   No orders of the defined types were placed in this encounter.     Procedures: No procedures performed   Clinical Data: No additional findings.   Subjective: Chief Complaint  Patient presents with  . Left Hip - Follow-up    HPI Veronica Stanley is now 13 months status post left total hip arthroplasty.  She states left hip is overall doing great.  She does set off several alarms in several stores and is asking for card stating that she does have a hip replacement.  Her only complaint today is that she is having some right knee pain.  She is had no known injury to the right knee.  She denies any right hip pain or groin pain. Review of Systems See HPI otherwise negative or noncontributory.  Objective: Vital Signs: LMP  (LMP Unknown)   Physical Exam Constitutional:      Appearance: She is not ill-appearing or diaphoretic.  Pulmonary:     Effort: Pulmonary effort is normal.  Neurological:     Mental Status: She  is alert and oriented to person, place, and time.  Psychiatric:        Mood and Affect: Mood normal.        Behavior: Behavior normal.     Ortho Exam Left hip good range of motion without pain.  Right hip good range of motion without pain.  Right knee considerable patellofemoral crepitus with passive range of motion.  Tenderness along medial joint line no abnormal warmth erythema no instability valgus varus stressing.  Slight effusion. Specialty Comments:  No specialty comments available.  Imaging: Xr Hip Unilat W Or W/o Pelvis 1v Left  Result Date: 12/02/2018 AP pelvis lateral view left hip: Shows well-seated left total hip arthroplasty.  Right hip with moderate narrowing.  No acute fractures otherwise.  Xr Knee 1-2 Views Right  Result Date: 12/02/2018 Right knee AP and lateral view shows near bone-on-bone medial compartment severe patellofemoral changes.  Lateral compartment is well-preserved.  No bony abnormalities no acute fractures no  acute findings.    PMFS History: Patient Active Problem List   Diagnosis Date Noted  . Obstructive sleep apnea 04/17/2018  . Sleep disorder 03/13/2018  . PAD (peripheral artery disease) ([place]) 02/28/2018  . Morbid obesity (  [place]) 11/28/2017  . 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 Unilateral primary osteoarthritis, left hip 09/17/2017  . Osteoarthritis of both hips 11/10/2015  . Advance directive discussed with patient 11/03/2014  . Routine general medical examination at a health care facility 03/03/2011  . Osteoarthritis, multiple sites   . GERD 10/11/2007  . ACTINIC KERATOSIS 04/17/2007  . Essential hypertension, benign 03/28/2007  . DIVERTICULOSIS, COLON 03/28/2007  . OSTEOPOROSIS 03/28/2007   Past Medical History:  Diagnosis Date  . Basal cell carcinoma, arm 11/13   on arm and face   . Diverticulitis    colon  . GERD (gastroesophageal reflux disease)   . Hypertension    patient denies not on meds   . Menopausal  symptoms   . Osteoarthrosis involving, or with mention of more than one site, but not specified as generalized, multiple sites   . Osteoporosis   . Peripheral vascular disease ([place])     Family History  Problem Relation Age of Onset  . Osteoporosis Mother   . Coronary artery disease Father   . Breast cancer Sister 53  . Cancer Maternal Grandfather        colon cancer  . Diabetes Neg Hx   . Hypertension Neg Hx     Past Surgical History:  Procedure Laterality Date  . ABDOMINAL HYSTERECTOMY    . BREAST BIOPSY Left 2003   Left breast calcifications removed  . COLONOSCOPY WITH PROPOFOL N/A 04/22/2018   Procedure: COLONOSCOPY WITH PROPOFOL;  Surgeon: Manya Silvas, MD;  Location: [facility] ENDOSCOPY;  Service: Endoscopy;  Laterality: N/A;  . DILATION AND CURETTAGE OF UTERUS     rupture- peritonitis & hysterectomy 12/79  . TOTAL HIP ARTHROPLASTY Left 10/19/2017   Procedure: LEFT TOTAL HIP ARTHROPLASTY ANTERIOR APPROACH;  Surgeon: Mcarthur Rossetti, MD;  Location: [facility];  Service: Orthopedics;  Laterality: Left;  Marland Kitchen VAGINAL DELIVERY     x2   Social History   Occupational History  . 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 Occupation:    Tobacco Use  . Smoking status: Never Smoker  . Smokeless tobacco: Never Used  Substance and Sexual Activity  . Alcohol use: Yes    Alcohol/week: 3.0 standard drinks    Types: 3 Glasses of wine per week  . Drug use: Never  . Sexual activity: Not on file

## 2018-12-10 ENCOUNTER — Encounter: Payer: Medicare Other | Admitting: Internal Medicine

## 2018-12-11 ENCOUNTER — Other Ambulatory Visit: Payer: Self-pay

## 2018-12-11 ENCOUNTER — Encounter: Payer: Self-pay | Admitting: Internal Medicine

## 2018-12-11 ENCOUNTER — Ambulatory Visit (INDEPENDENT_AMBULATORY_CARE_PROVIDER_SITE_OTHER): Payer: Medicare Other | Admitting: Internal Medicine

## 2018-12-11 VITALS — BP 136/88 | HR 60 | Temp 98.2°F | Ht 67.0 in | Wt 235.0 lb

## 2018-12-11 DIAGNOSIS — K219 Gastro-esophageal reflux disease without esophagitis: Secondary | ICD-10-CM

## 2018-12-11 DIAGNOSIS — G4733 Obstructive sleep apnea (adult) (pediatric): Secondary | ICD-10-CM

## 2018-12-11 DIAGNOSIS — I739 Peripheral vascular disease, unspecified: Secondary | ICD-10-CM | POA: Diagnosis not present

## 2018-12-11 DIAGNOSIS — I10 Essential (primary) hypertension: Secondary | ICD-10-CM | POA: Diagnosis not present

## 2018-12-11 DIAGNOSIS — Z7189 Other specified counseling: Secondary | ICD-10-CM

## 2018-12-11 DIAGNOSIS — Z0000 Encounter for general adult medical examination without abnormal findings: Secondary | ICD-10-CM

## 2018-12-11 LAB — CBC
HCT: 38.5 % (ref 36.0–46.0)
Hemoglobin: 12.6 g/dL (ref 12.0–15.0)
MCHC: 32.7 g/dL (ref 30.0–36.0)
MCV: 90 fl (ref 78.0–100.0)
Platelets: 252 10*3/uL (ref 150.0–400.0)
RBC: 4.28 Mil/uL (ref 3.87–5.11)
RDW: 14.2 % (ref 11.5–15.5)
WBC: 5.8 10*3/uL (ref 4.0–10.5)

## 2018-12-11 LAB — COMPREHENSIVE METABOLIC PANEL
ALT: 9 U/L (ref 0–35)
AST: 13 U/L (ref 0–37)
Albumin: 4 g/dL (ref 3.5–5.2)
Alkaline Phosphatase: 66 U/L (ref 39–117)
BUN: 12 mg/dL (ref 6–23)
CO2: 30 mEq/L (ref 19–32)
Calcium: 9.1 mg/dL (ref 8.4–10.5)
Chloride: 105 mEq/L (ref 96–112)
Creatinine, Ser: 0.7 mg/dL (ref 0.40–1.20)
GFR: 81.58 mL/min (ref >60.00)
Glucose, Bld: 92 mg/dL (ref 70–99)
Potassium: 5 mEq/L (ref 3.5–5.1)
Sodium: 141 mEq/L (ref 135–145)
Total Bilirubin: 0.5 mg/dL (ref 0.2–1.2)
Total Protein: 7 g/dL (ref 6.0–8.3)

## 2018-12-11 LAB — T4, FREE: Free T4: 0.87 ng/dL (ref 0.60–1.60)

## 2018-12-11 NOTE — Progress Notes (Signed)
Subjective:    Patient ID: Veronica Stanley, female    DOB: 02/03/1946, 73 y.o.   MRN: [id number]  HPI Here for Medicare wellness visit and follow up of chronic health conditions Reviewed form and advance directives Reviewed other doctors Occasional alcohol No tobacco Exercisedregularly with set class--now not able (discussed home exercise) No falls No depression or anhedonia Vision and hearing are fine Independent with instrumental ADLs No memory issues  Has gone back to work at [employer] 3 days per week Discussed mask and social distancing  Had second ever urinary infection when travelling [place] to walk in clinic Better with macrobid  Is trying to eat right---Weight Watchers etc COVID has affected all that Weight is stable  Had seen the podiatrist for circulation issues in her feet Color change especially in left 5th toe Did better with soaks (?Raynaud's) They do turn grey at times  No chest pain  No SOB No dizziness or syncope No edema  "I hate that machine"--- the CPAP Is using it every night It has helped her daytime somnolence  Using the omeprazole 3 days per week for the most part (or less) Notices more problems at night due to the CPAP and eating late Will use tums prn No dysphagia  Current Outpatient Medications on File Prior to Visit  Medication Sig Dispense Refill  . Cholecalciferol (VITAMIN D3) 1000 units CAPS Take 1 capsule by mouth daily.    Marland Kitchen ibuprofen (ADVIL,MOTRIN) 200 MG tablet Take 200 mg by mouth daily as needed.    Marland Kitchen omeprazole (PRILOSEC) 20 MG capsule TAKE ONE CAPSULE BY MOUTH DAILY AS NEEDED FOR HEARTBURN OR INDIGESTION. 30 capsule 2  . TURMERIC PO Take 900 mg by mouth daily.     No current facility-administered medications on file prior to visit.     Allergies  Allergen Reactions  . Penicillins Anaphylaxis, Shortness Of Breath, Swelling and Rash    Has patient had a PCN reaction causing immediate rash, facial/tongue/throat swelling,  SOB or lightheadedness with hypotension: Yes Has patient had a PCN reaction causing severe rash involving mucus membranes or skin necrosis: Yes Has patient had a PCN reaction that required hospitalization: No Has patient had a PCN reaction occurring within the last 10 years: No If all of the above answers are "NO", then may proceed with Cephalosporin use.   . Ibandronate Sodium Other (See Comments)    aches    Past Medical History:  Diagnosis Date  . Basal cell carcinoma, arm 11/13   on arm and face   . Diverticulitis    colon  . GERD (gastroesophageal reflux disease)   . Hypertension    patient denies not on meds   . Menopausal symptoms   . Osteoarthrosis involving, or with mention of more than one site, but not specified as generalized, multiple sites   . Osteoporosis   . Peripheral vascular disease [facility])     Past Surgical History:  Procedure Laterality Date  . ABDOMINAL HYSTERECTOMY    . BREAST BIOPSY Left 2003   Left breast calcifications removed  . COLONOSCOPY WITH PROPOFOL N/A 04/22/2018   Procedure: COLONOSCOPY WITH PROPOFOL;  Surgeon: Manya Silvas, MD;  Location: [facility] ENDOSCOPY;  Service: Endoscopy;  Laterality: N/A;  . DILATION AND CURETTAGE OF UTERUS     rupture- peritonitis & hysterectomy 12/79  . TOTAL HIP ARTHROPLASTY Left 10/19/2017   Procedure: LEFT TOTAL HIP ARTHROPLASTY ANTERIOR APPROACH;  Surgeon: Mcarthur Rossetti, MD;  Location: [facility] ORS;  Service: Orthopedics;  Laterality: Left;  Marland Kitchen VAGINAL DELIVERY     x2    Family History  Problem Relation Age of Onset  . Osteoporosis Mother   . Coronary artery disease Father   . Breast cancer Sister 69  . Cancer Maternal Grandfather        colon cancer  . Diabetes Neg Hx   . Hypertension Neg Hx     Social History   Socioeconomic History  . Marital status: Divorced    Spouse name: Not on file  . Number of children: 2  . Years of education: Not on file  . Highest education level: Not on file   Occupational History  . 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 Occupation:    Social Needs  . Financial resource strain: Not on file  . Food insecurity    Worry: Not on file    Inability: Not on file  . Transportation needs    Medical: Not on file    Non-medical: Not on file  Tobacco Use  . Smoking status: Never Smoker  . Smokeless tobacco: Never Used  Substance and Sexual Activity  . Alcohol use: Yes    Alcohol/week: 3.0 standard drinks    Types: 3 Glasses of wine per week  . Drug use: Never  . Sexual activity: Not on file  Lifestyle  . Physical activity    Days per week: Not on file    Minutes per session: Not on file  . Stress: Not on file  Relationships  . Social Herbalist on phone: Not on file    Gets together: Not on file    Attends religious service: Not on file    Active member of club or organization: Not on file    Attends meetings of clubs or organizations: Not on file    Relationship status: Not on file  . Intimate partner violence    Fear of current or ex partner: Not on file    Emotionally abused: Not on file    Physically abused: Not on file    Forced sexual activity: Not on file  Other Topics Concern  . Not on file  Social History Narrative   No living will   Would want daughter, Margarita Grizzle,  Then son Ginnie Smart to make health care decisions for her   Would accept resuscitation attempts   Not sure about tube feedings   Review of Systems Appetite is fine Weight is fairly stable Sleeping okay Wears seat belt Bowels are fine--no blood Voids okay----does have slight urge incontinence (wears pad at times) Does have some back pain ----if doing sig yard work. Did have ortho reevaluation---right hip and knee arthritis No suspicious skin lesions. Sees the dermatologist Took fluconazole weekly for a while--helped fungal toenails    Objective:   Physical Exam  Constitutional: She is oriented to person, place,  and time. She appears well-developed. No distress.  HENT:  Mouth/Throat: Oropharynx is clear and moist. No oropharyngeal exudate.  Neck: No thyromegaly present.  Cardiovascular: Normal rate, regular rhythm and normal heart sounds. Exam reveals no gallop.  No murmur heard. Faint pedal pulses  Respiratory: Effort normal and breath sounds normal. No respiratory distress. She has no wheezes. She has no rales.  GI: Soft. There is no abdominal tenderness.  Musculoskeletal:        General: No tenderness or edema.  Lymphadenopathy:    She has no cervical adenopathy.  Neurological: She is alert and oriented  to person, place, and time.  President--- "Daisy Floro, Abbe [place]" [id number] Recall 3/3  Skin: No rash noted. No erythema.  Psychiatric: She has a normal mood and affect. Her behavior is normal.           Assessment & Plan:

## 2018-12-11 NOTE — Assessment & Plan Note (Signed)
BMI ~36 with HTN, osteoarthritis, ?vascular Discussed exercise and eating plan Goal 10# loss this year

## 2018-12-11 NOTE — Assessment & Plan Note (Signed)
See social history 

## 2018-12-11 NOTE — Assessment & Plan Note (Signed)
I have personally reviewed the Medicare Annual Wellness questionnaire and have noted 1. The patient's medical and social history 2. Their use of alcohol, tobacco or illicit drugs 3. Their current medications and supplements 4. The patient's functional ability including ADL's, fall risks, home safety risks and hearing or visual             impairment. 5. Diet and physical activities 6. Evidence for depression or mood disorders  The patients weight, height, BMI and visual acuity have been recorded in the chart I have made referrals, counseling and provided education to the patient based review of the above and I have provided the pt with a written personalized care plan for preventive services.  I have provided you with a copy of your personalized plan for preventive services. Please take the time to review along with your updated medication list.  Flu vaccine in the fall Consider shingrix Yearly mammogram due to FH---probably till 36 Done with mammograms---just had normal Discussed fitness

## 2018-12-11 NOTE — Patient Instructions (Signed)
DASH Eating Plan  DASH stands for "Dietary Approaches to Stop Hypertension." The DASH eating plan is a healthy eating plan that has been shown to reduce high blood pressure (hypertension). It may also reduce your risk for type 2 diabetes, heart disease, and stroke. The DASH eating plan may also help with weight loss.  What are tips for following this plan?    General guidelines   Avoid eating more than 2,300 mg (milligrams) of salt (sodium) a day. If you have hypertension, you may need to reduce your sodium intake to 1,500 mg a day.   Limit alcohol intake to no more than 1 drink a day for nonpregnant women and 2 drinks a day for men. One drink equals 12 oz of beer, 5 oz of wine, or 1 oz of hard liquor.   Work with your health care provider to maintain a healthy body weight or to lose weight. Ask what an ideal weight is for you.   Get at least 30 minutes of exercise that causes your heart to beat faster (aerobic exercise) most days of the week. Activities may include walking, swimming, or biking.   Work with your health care provider or diet and nutrition specialist (dietitian) to adjust your eating plan to your individual calorie needs.  Reading food labels     Check food labels for the amount of sodium per serving. Choose foods with less than 5 percent of the Daily Value of sodium. Generally, foods with less than 300 mg of sodium per serving fit into this eating plan.   To find whole grains, look for the word "whole" as the first word in the ingredient list.  Shopping   Buy products labeled as "low-sodium" or "no salt added."   Buy fresh foods. Avoid canned foods and premade or frozen meals.  Cooking   Avoid adding salt when cooking. Use salt-free seasonings or herbs instead of table salt or sea salt. Check with your health care provider or pharmacist before using salt substitutes.   Do not fry foods. Cook foods using healthy methods such as baking, boiling, grilling, and broiling instead.   Cook with  heart-healthy oils, such as olive, canola, soybean, or sunflower oil.  Meal planning   Eat a balanced diet that includes:  ? 5 or more servings of fruits and vegetables each day. At each meal, try to fill half of your plate with fruits and vegetables.  ? Up to 6-8 servings of whole grains each day.  ? Less than 6 oz of lean meat, poultry, or fish each day. A 3-oz serving of meat is about the same size as a deck of cards. One egg equals 1 oz.  ? 2 servings of low-fat dairy each day.  ? A serving of nuts, seeds, or beans 5 times each week.  ? Heart-healthy fats. Healthy fats called Omega-3 fatty acids are found in foods such as flaxseeds and coldwater fish, like sardines, salmon, and mackerel.   Limit how much you eat of the following:  ? Canned or prepackaged foods.  ? Food that is high in trans fat, such as fried foods.  ? Food that is high in saturated fat, such as fatty meat.  ? Sweets, desserts, sugary drinks, and other foods with added sugar.  ? Full-fat dairy products.   Do not salt foods before eating.   Try to eat at least 2 vegetarian meals each week.   Eat more home-cooked food and less restaurant, buffet, and fast food.     When eating at a restaurant, ask that your food be prepared with less salt or no salt, if possible.  What foods are recommended?  The items listed may not be a complete list. Talk with your dietitian about what dietary choices are best for you.  Grains  Whole-grain or whole-wheat bread. Whole-grain or whole-wheat pasta. Brown rice. Oatmeal. Quinoa. Bulgur. Whole-grain and low-sodium cereals. Pita bread. Low-fat, low-sodium crackers. Whole-wheat flour tortillas.  Vegetables  Fresh or frozen vegetables (raw, steamed, roasted, or grilled). Low-sodium or reduced-sodium tomato and vegetable juice. Low-sodium or reduced-sodium tomato sauce and tomato paste. Low-sodium or reduced-sodium canned vegetables.  Fruits  All fresh, dried, or frozen fruit. Canned fruit in natural juice (without  added sugar).  Meat and other protein foods  Skinless chicken or turkey. Ground chicken or turkey. Pork with fat trimmed off. Fish and seafood. Egg whites. Dried beans, peas, or lentils. Unsalted nuts, nut butters, and seeds. Unsalted canned beans. Lean cuts of beef with fat trimmed off. Low-sodium, lean deli meat.  Dairy  Low-fat (1%) or fat-free (skim) milk. Fat-free, low-fat, or reduced-fat cheeses. Nonfat, low-sodium ricotta or cottage cheese. Low-fat or nonfat yogurt. Low-fat, low-sodium cheese.  Fats and oils  Soft margarine without trans fats. Vegetable oil. Low-fat, reduced-fat, or light mayonnaise and salad dressings (reduced-sodium). Canola, safflower, olive, soybean, and sunflower oils. Avocado.  Seasoning and other foods  Herbs. Spices. Seasoning mixes without salt. Unsalted popcorn and pretzels. Fat-free sweets.  What foods are not recommended?  The items listed may not be a complete list. Talk with your dietitian about what dietary choices are best for you.  Grains  Baked goods made with fat, such as croissants, muffins, or some breads. Dry pasta or rice meal packs.  Vegetables  Creamed or fried vegetables. Vegetables in a cheese sauce. Regular canned vegetables (not low-sodium or reduced-sodium). Regular canned tomato sauce and paste (not low-sodium or reduced-sodium). Regular tomato and vegetable juice (not low-sodium or reduced-sodium). Pickles. Olives.  Fruits  Canned fruit in a light or heavy syrup. Fried fruit. Fruit in cream or butter sauce.  Meat and other protein foods  Fatty cuts of meat. Ribs. Fried meat. Bacon. Sausage. Bologna and other processed lunch meats. Salami. Fatback. Hotdogs. Bratwurst. Salted nuts and seeds. Canned beans with added salt. Canned or smoked fish. Whole eggs or egg yolks. Chicken or turkey with skin.  Dairy  Whole or 2% milk, cream, and half-and-half. Whole or full-fat cream cheese. Whole-fat or sweetened yogurt. Full-fat cheese. Nondairy creamers. Whipped toppings.  Processed cheese and cheese spreads.  Fats and oils  Butter. Stick margarine. Lard. Shortening. Ghee. Bacon fat. Tropical oils, such as coconut, palm kernel, or palm oil.  Seasoning and other foods  Salted popcorn and pretzels. Onion salt, garlic salt, seasoned salt, table salt, and sea salt. Worcestershire sauce. Tartar sauce. Barbecue sauce. Teriyaki sauce. Soy sauce, including reduced-sodium. Steak sauce. Canned and packaged gravies. Fish sauce. Oyster sauce. Cocktail sauce. Horseradish that you find on the shelf. Ketchup. Mustard. Meat flavorings and tenderizers. Bouillon cubes. Hot sauce and Tabasco sauce. Premade or packaged marinades. Premade or packaged taco seasonings. Relishes. Regular salad dressings.  Where to find more information:   National Heart, Lung, and Blood Institute: www.nhlbi.nih.gov   American Heart Association: www.heart.org  Summary   The DASH eating plan is a healthy eating plan that has been shown to reduce high blood pressure (hypertension). It may also reduce your risk for type 2 diabetes, heart disease, and stroke.   With the   DASH eating plan, you should limit salt (sodium) intake to 2,300 mg a day. If you have hypertension, you may need to reduce your sodium intake to 1,500 mg a day.   When on the DASH eating plan, aim to eat more fresh fruits and vegetables, whole grains, lean proteins, low-fat dairy, and heart-healthy fats.   Work with your health care provider or diet and nutrition specialist (dietitian) to adjust your eating plan to your individual calorie needs.  This information is not intended to replace advice given to you by your health care provider. Make sure you discuss any questions you have with your health care provider.  Document Released: 05/25/2011 Document Revised: 05/29/2016 Document Reviewed: 05/29/2016  Elsevier Interactive Patient Education  2019 Elsevier Inc.

## 2018-12-11 NOTE — Assessment & Plan Note (Signed)
Home assessment abnormal Seems to just be microcirculation---like form of Raynaud's

## 2018-12-11 NOTE — Assessment & Plan Note (Signed)
BP Readings from Last 3 Encounters:  12/11/18 136/88  04/22/18 (!) 158/79  04/17/18 132/84   Better now Can hold off on medication

## 2018-12-11 NOTE — Assessment & Plan Note (Signed)
Doesn't like the machine Will look into different mask

## 2018-12-11 NOTE — Assessment & Plan Note (Signed)
Doing okay with reduced dosing

## 2018-12-17 NOTE — Telephone Encounter (Signed)
Please mail her a copy of her labs

## 2018-12-17 NOTE — Telephone Encounter (Signed)
Labs printed and mailed to pt as requested. 

## 2019-01-14 ENCOUNTER — Other Ambulatory Visit: Payer: Self-pay

## 2019-01-14 DIAGNOSIS — Z20822 Contact with and (suspected) exposure to covid-19: Secondary | ICD-10-CM

## 2019-01-16 LAB — NOVEL CORONAVIRUS, NAA: SARS-CoV-2, NAA: NOT DETECTED

## 2019-02-13 IMAGING — MG DIGITAL SCREENING BILATERAL MAMMOGRAM WITH TOMO AND CAD
8 series · 8 of 24 positions shown · non-contrast
Comparison: Previous exam(s).

CLINICAL DATA: Screening.

EXAM:
DIGITAL SCREENING BILATERAL MAMMOGRAM WITH TOMO AND CAD

[L CC synth-2D]
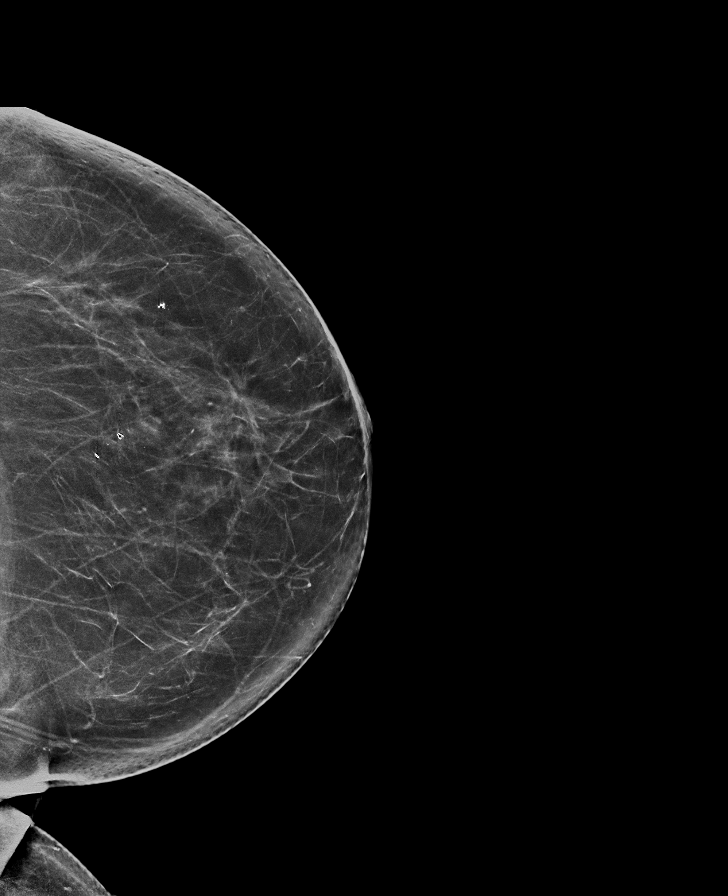

[R CC synth-2D]
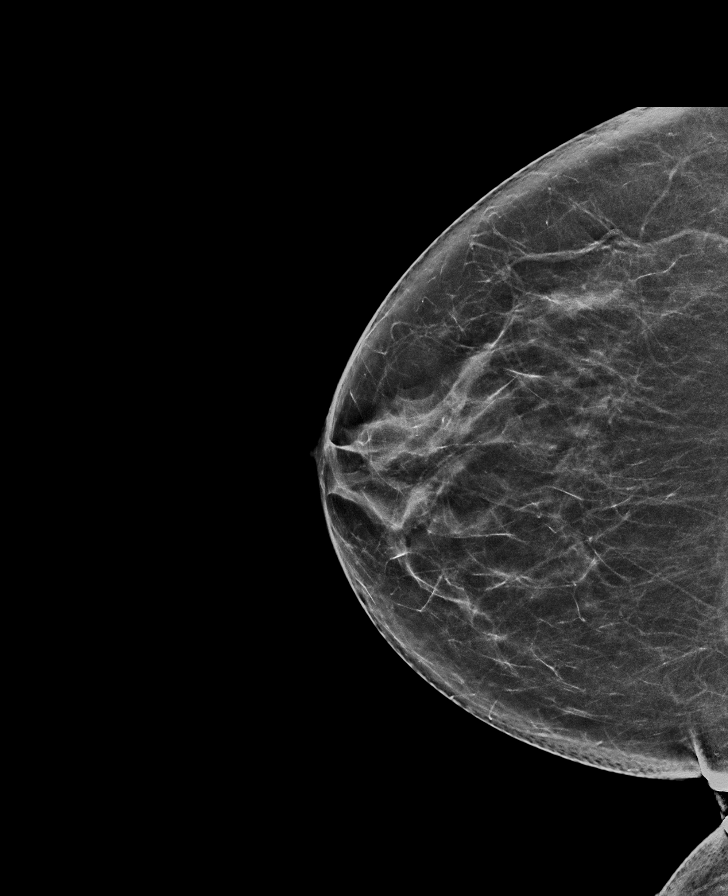

[R MLO synth-2D]
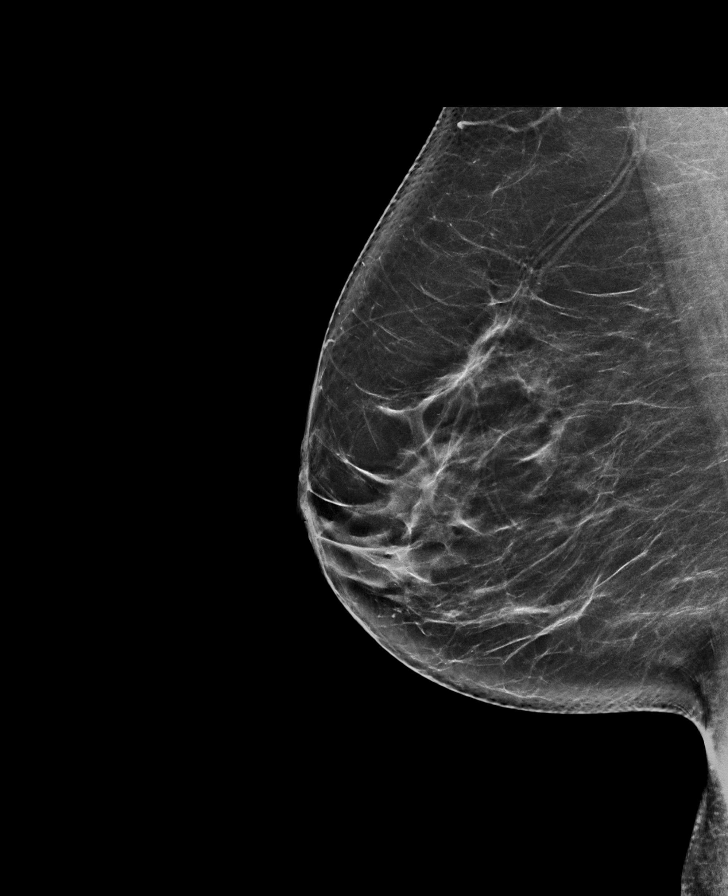

[L MLO synth-2D]
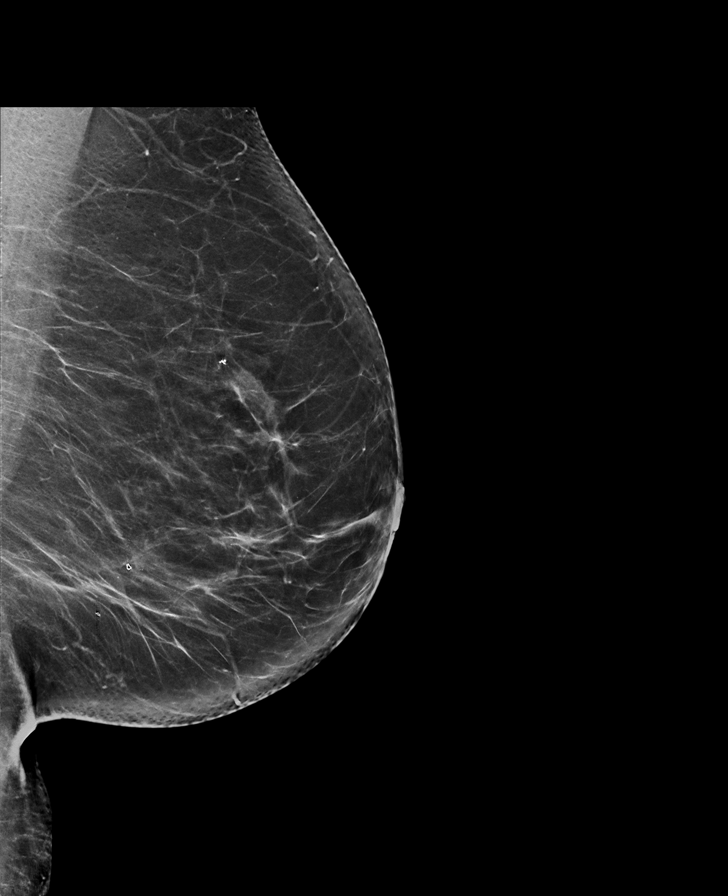

[R MLO tomo · tomo slice 39/76.0]
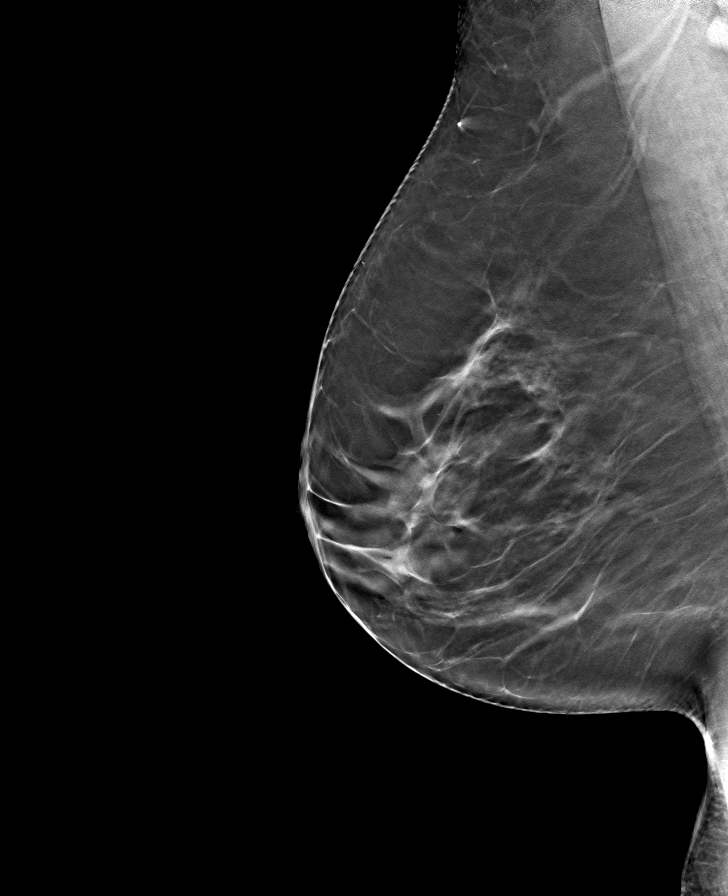

[L MLO tomo · tomo slice 40/79.0]
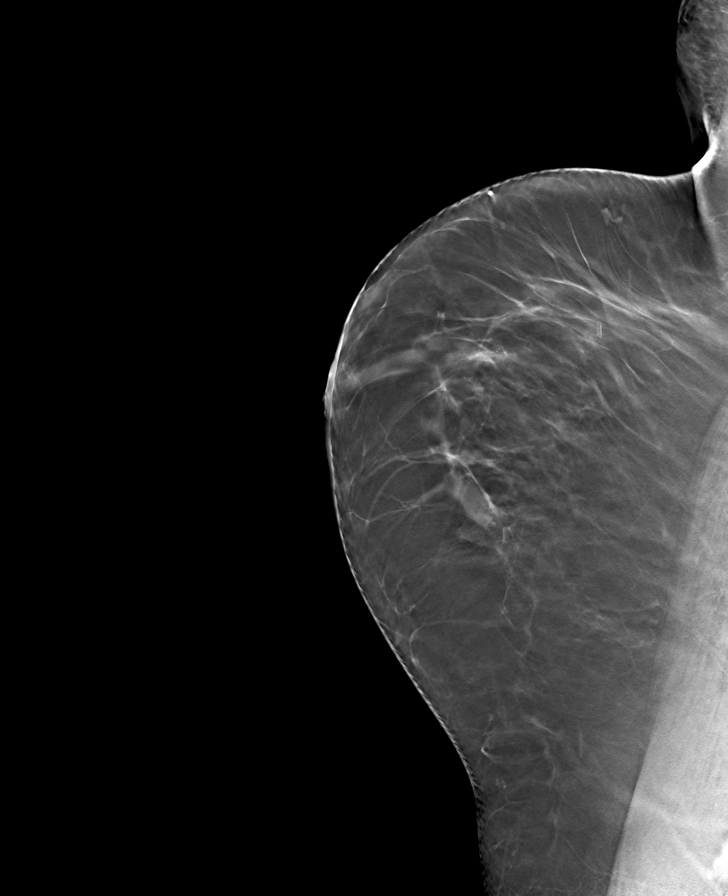

[R CC tomo · tomo slice 35/69.0]
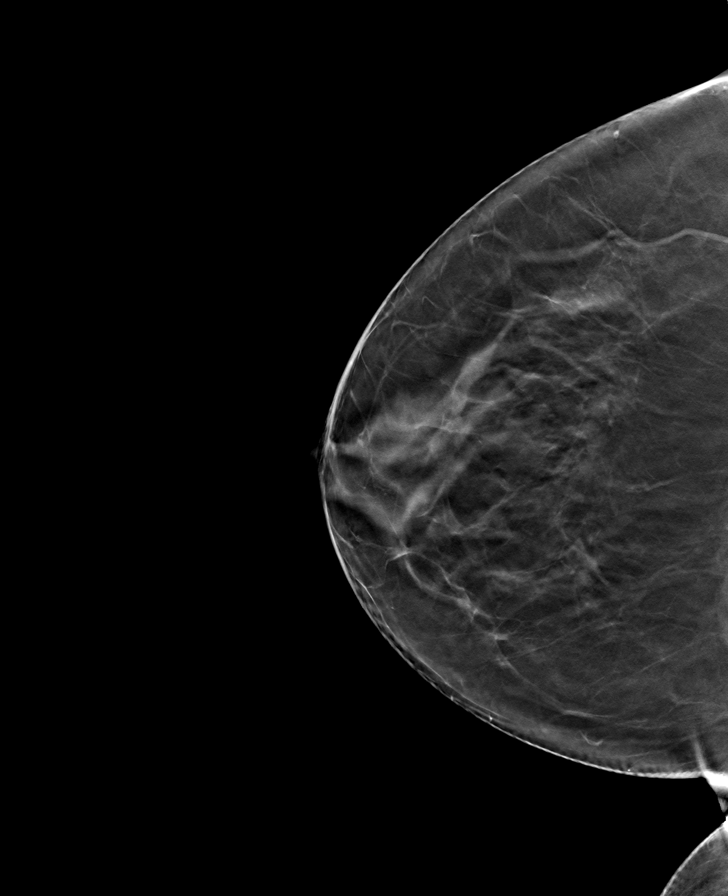

[L CC tomo · tomo slice 38/75.0]
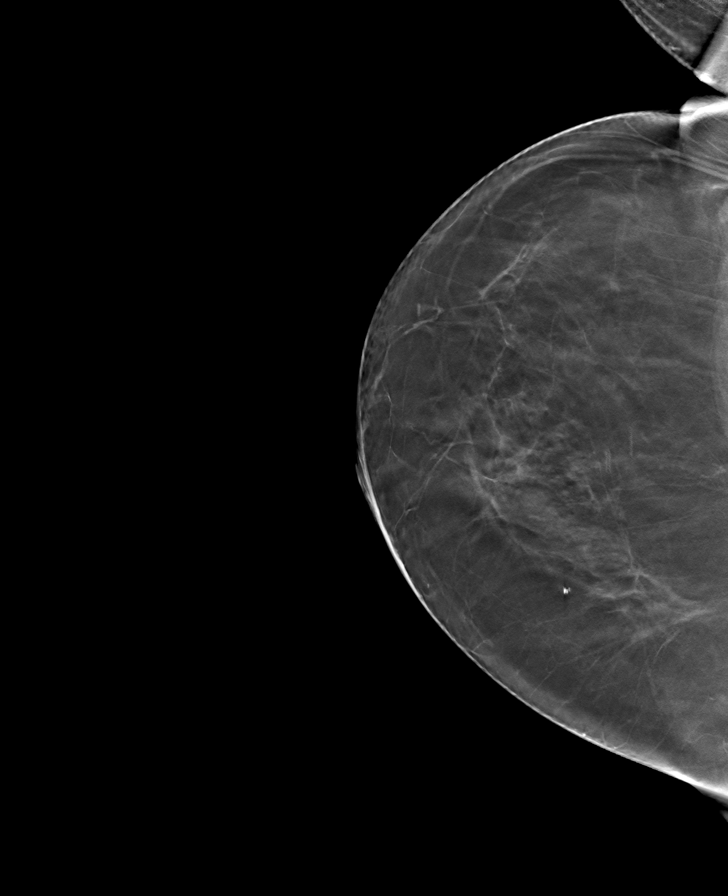

[8 of 24 positions shown; findings below may reference images not displayed]

ACR Breast Density Category b: There are scattered areas of
fibroglandular density.
FINDINGS: There are no findings suspicious for malignancy. Images were
processed with CAD.
IMPRESSION: No mammographic evidence of malignancy. A result letter of this
screening mammogram will be mailed directly to the patient.

RECOMMENDATION:
Screening mammogram in one year. (Code:[id number])

BI-RADS CATEGORY  1: Negative.

## 2019-02-27 ENCOUNTER — Other Ambulatory Visit: Payer: Self-pay | Admitting: Ophthalmology

## 2019-02-27 DIAGNOSIS — G453 Amaurosis fugax: Secondary | ICD-10-CM

## 2019-03-05 ENCOUNTER — Ambulatory Visit
Admission: RE | Admit: 2019-03-05 | Discharge: 2019-03-05 | Disposition: A | Discharge status: Home / Self Care | Payer: Medicare Other | Source: Ambulatory Visit | Attending: Ophthalmology | Admitting: Ophthalmology

## 2019-03-05 ENCOUNTER — Other Ambulatory Visit: Payer: Self-pay

## 2019-03-05 DIAGNOSIS — G453 Amaurosis fugax: Secondary | ICD-10-CM | POA: Insufficient documentation

## 2019-06-02 ENCOUNTER — Other Ambulatory Visit: Payer: Self-pay | Admitting: Internal Medicine

## 2019-06-02 ENCOUNTER — Ambulatory Visit
Admission: RE | Admit: 2019-06-02 | Discharge: 2019-06-02 | Disposition: A | Discharge status: Home / Self Care | Payer: Medicare Other | Source: Ambulatory Visit | Attending: Internal Medicine | Admitting: Internal Medicine

## 2019-06-02 DIAGNOSIS — Z1231 Encounter for screening mammogram for malignant neoplasm of breast: Secondary | ICD-10-CM

## 2019-07-10 DIAGNOSIS — Z85828 Personal history of other malignant neoplasm of skin: Secondary | ICD-10-CM | POA: Diagnosis not present

## 2019-07-10 DIAGNOSIS — D2261 Melanocytic nevi of right upper limb, including shoulder: Secondary | ICD-10-CM | POA: Diagnosis not present

## 2019-07-10 DIAGNOSIS — B351 Tinea unguium: Secondary | ICD-10-CM | POA: Diagnosis not present

## 2019-07-10 DIAGNOSIS — D2271 Melanocytic nevi of right lower limb, including hip: Secondary | ICD-10-CM | POA: Diagnosis not present

## 2019-07-10 DIAGNOSIS — L821 Other seborrheic keratosis: Secondary | ICD-10-CM | POA: Diagnosis not present

## 2019-07-10 DIAGNOSIS — L814 Other melanin hyperpigmentation: Secondary | ICD-10-CM | POA: Diagnosis not present

## 2019-07-10 DIAGNOSIS — L853 Xerosis cutis: Secondary | ICD-10-CM | POA: Diagnosis not present

## 2019-07-10 DIAGNOSIS — D2262 Melanocytic nevi of left upper limb, including shoulder: Secondary | ICD-10-CM | POA: Diagnosis not present

## 2019-07-10 DIAGNOSIS — X32XXXA Exposure to sunlight, initial encounter: Secondary | ICD-10-CM | POA: Diagnosis not present

## 2019-10-09 ENCOUNTER — Encounter: Payer: Self-pay | Admitting: Pulmonary Disease

## 2019-10-09 ENCOUNTER — Ambulatory Visit: Payer: Medicare Other | Admitting: Pulmonary Disease

## 2019-10-09 ENCOUNTER — Other Ambulatory Visit: Payer: Self-pay

## 2019-10-09 VITALS — BP 124/80 | HR 85 | Temp 97.3°F | Ht 67.5 in | Wt 241.2 lb

## 2019-10-09 DIAGNOSIS — G4733 Obstructive sleep apnea (adult) (pediatric): Secondary | ICD-10-CM | POA: Diagnosis not present

## 2019-10-09 NOTE — Progress Notes (Signed)
[name]  Chief Complaint  Patient presents with  . Follow-up    Patient reports that she has not been wearing her CPAP since December because she thinks that she does not need it and would like another sleep study.     Constitutional:  BP 124/80 (BP Location: Right Arm, Cuff Size: Normal)   Pulse 85   Temp (!) 97.3 F (36.3 C) (Temporal)   Ht 5' 7.5" (1.715 m)   Wt 241 lb 3.2 oz (109.4 kg)   LMP  (LMP Unknown)   SpO2 96%   BMI 37.22 kg/m   Past Medical History:  Diverticulitis, GERD, HTN, OA, PAD  Summary:  Veronica Stanley is a 74 y.o. female with obstructive sleep apnea.  Subjective:   She was previously followed by Dr. Ashby Dawes.  She had home sleep study in October 2019.  Showed severe OSA.  She was set up with CPAP through [employer].  After set up she never got replacement supplies.  She got to the point that her mask wasn't comfortable to wear, and she stopped using in December 2020.  She also doesn't feel like the home sleep study results were accurate since she didn't sleep very well on the night of the study.  She snores.  She is a restless sleeper.  She uses bathroom twice per night.  Goes to bed between 830 and 10 pm.  Doesn't use anything to help sleep.  Wakes up between 5 and 730 am.  Drinks two cups of coffee in the morning.  She denies sleep walking, sleep talking, bruxism, or nightmares.  There is no history of restless legs.  She denies sleep hallucinations, sleep paralysis, or cataplexy.  The Epworth score is 4 out of 24.  Physical Exam:   Appearance - well kempt  ENMT - no sinus tenderness, no nasal discharge, no oral exudate, Mallampati 2, decreased AP diameter  Respiratory - no wheeze, or rales  CV - regular rate and rhythm, no murmurs  GI - soft, non tender  Lymph - no adenopathy noted in neck  Ext - no edema  Skin - no rashes  Neuro - normal strength, oriented x 3  Psych - normal mood and  affect   Assessment/Plan:   Snoring with excessive daytime sleepiness. - will need to arrange for a in lab sleep study since there is question about validity of prior home sleep study  Obesity. - discussed how weight can impact sleep and risk for sleep disordered breathing - discussed options to assist with weight loss: combination of diet modification, cardiovascular and strength training exercises  Cardiovascular risk. - had an extensive discussion regarding the adverse health consequences related to untreated sleep disordered breathing - specifically discussed the risks for hypertension, coronary artery disease, cardiac dysrhythmias, cerebrovascular disease, and diabetes - lifestyle modification discussed  Safe driving practices. - discussed how sleep disruption can increase risk of accidents, particularly when driving - safe driving practices were discussed  Therapies for obstructive sleep apnea. - if the sleep study shows significant sleep apnea, then various therapies for treatment were reviewed: CPAP, oral appliance, and surgical interventions  A total of  33 minutes spent addressing patient care issues on day of visit.   Follow up:  Patient Instructions  Will arrange for in lab sleep study Will call to arrange for follow up after sleep study reviewed    Signature:  Chesley Mires, MD [place] Pager: [phone] 10/09/2019, 10:29 AM  Flow  Sheet    Sleep tests:  HST 03/25/18 >> AHI 47.7, SpO2 low 66%  Cardiac tests:  Echo 11/26/15 >> EF 55 to 60%  Medications:   Allergies as of 10/09/2019      Reactions   Penicillins Anaphylaxis, Shortness Of Breath, Swelling, Rash   Has patient had a PCN reaction causing immediate rash, facial/tongue/throat swelling, SOB or lightheadedness with hypotension: Yes Has patient had a PCN reaction causing severe rash involving mucus membranes or skin necrosis: Yes Has patient had a PCN reaction that required  hospitalization: No Has patient had a PCN reaction occurring within the last 10 years: No If all of the above answers are "NO", then may proceed with Cephalosporin use.   Ibandronate Sodium Other (See Comments)   aches      Medication List       Accurate as of October 09, 2019 10:29 AM. If you have any questions, ask your nurse or doctor.        ibuprofen 200 MG tablet Commonly known as: ADVIL Take 200 mg by mouth daily as needed.   omeprazole 20 MG capsule Commonly known as: PRILOSEC TAKE ONE CAPSULE BY MOUTH DAILY AS NEEDED FOR HEARTBURN OR INDIGESTION.   TURMERIC PO Take 900 mg by mouth daily.   Vitamin D3 25 MCG (1000 UT) Caps Take 1 capsule by mouth daily.       Past Surgical History:  She  has a past surgical history that includes Dilation and curettage of uterus; Vaginal delivery; Abdominal hysterectomy; Total hip arthroplasty (Left, 10/19/2017); Colonoscopy with propofol (N/A, 04/22/2018); and Breast biopsy (Left, 2003).  Family History:  Her family history includes Breast cancer in an other family member; Breast cancer (age of onset: 33) in her sister; Cancer in her maternal grandfather; Coronary artery disease in her father; Osteoporosis in her mother.  Social History:  She  reports that she has never smoked. She has never used smokeless tobacco. She reports current alcohol use of about 3.0 standard drinks of alcohol per week. She reports that she does not use drugs.

## 2019-10-09 NOTE — Patient Instructions (Signed)
Will arrange for in lab sleep study Will call to arrange for follow up after sleep study reviewed  

## 2019-10-28 ENCOUNTER — Telehealth: Payer: Self-pay | Admitting: Pulmonary Disease

## 2019-10-28 NOTE — Telephone Encounter (Signed)
No need to change anything split night has been authorized [id number]  Joellen Jersey

## 2019-10-28 NOTE — Telephone Encounter (Signed)
Patient didn't believe the home sleep study was accurate and wanted to repeat a diagnostic study.  Can we check if they will approve an NPSG?

## 2019-10-28 NOTE — Telephone Encounter (Signed)
[facility] says pt does not qualify for a split night study wants her to do just a titration study is this ok ? Veronica Stanley

## 2019-11-12 ENCOUNTER — Encounter: Payer: Self-pay | Admitting: Pulmonary Disease

## 2019-11-12 ENCOUNTER — Ambulatory Visit: Payer: Medicare PPO | Attending: Pulmonary Disease

## 2019-11-12 DIAGNOSIS — G4733 Obstructive sleep apnea (adult) (pediatric): Secondary | ICD-10-CM | POA: Insufficient documentation

## 2019-11-12 DIAGNOSIS — Z6837 Body mass index (BMI) 37.0-37.9, adult: Secondary | ICD-10-CM | POA: Insufficient documentation

## 2019-11-12 DIAGNOSIS — G4761 Periodic limb movement disorder: Secondary | ICD-10-CM | POA: Insufficient documentation

## 2019-11-12 DIAGNOSIS — I739 Peripheral vascular disease, unspecified: Secondary | ICD-10-CM | POA: Insufficient documentation

## 2019-11-13 ENCOUNTER — Other Ambulatory Visit: Payer: Self-pay

## 2019-11-19 DIAGNOSIS — G4733 Obstructive sleep apnea (adult) (pediatric): Secondary | ICD-10-CM | POA: Diagnosis not present

## 2019-12-24 ENCOUNTER — Telehealth: Payer: Self-pay | Admitting: Pulmonary Disease

## 2019-12-24 NOTE — Telephone Encounter (Signed)
PSG 11/12/19 >> AHI 36, SpO2 low 60%   Please inform her that her sleep study shows severe obstructive sleep apnea.  Please arrange for ROV with me or NP to discuss treatment options.

## 2019-12-24 NOTE — Telephone Encounter (Signed)
ATC pt, no answer. Left message for pt to call back.  

## 2019-12-24 NOTE — Telephone Encounter (Signed)
ATC will call back

## 2019-12-29 ENCOUNTER — Encounter: Payer: Self-pay | Admitting: Physician Assistant

## 2019-12-29 ENCOUNTER — Ambulatory Visit: Payer: Self-pay

## 2019-12-29 ENCOUNTER — Other Ambulatory Visit: Payer: Self-pay

## 2019-12-29 ENCOUNTER — Ambulatory Visit: Payer: Medicare PPO | Admitting: Physician Assistant

## 2019-12-29 ENCOUNTER — Ambulatory Visit (INDEPENDENT_AMBULATORY_CARE_PROVIDER_SITE_OTHER): Payer: Medicare PPO

## 2019-12-29 DIAGNOSIS — M1711 Unilateral primary osteoarthritis, right knee: Secondary | ICD-10-CM

## 2019-12-29 DIAGNOSIS — G8929 Other chronic pain: Secondary | ICD-10-CM | POA: Diagnosis not present

## 2019-12-29 DIAGNOSIS — M25562 Pain in left knee: Secondary | ICD-10-CM | POA: Diagnosis not present

## 2019-12-29 MED ORDER — METHYLPREDNISOLONE ACETATE 40 MG/ML IJ SUSP
40.0000 mg | INTRAMUSCULAR | Status: AC | PRN
  Administered 2019-12-29: 40 mg via INTRA_ARTICULAR

## 2019-12-29 MED ORDER — LIDOCAINE HCL 1 % IJ SOLN
0.5000 mL | INTRAMUSCULAR | Status: AC | PRN
  Administered 2019-12-29: .5 mL

## 2019-12-29 MED ORDER — LIDOCAINE HCL 1 % IJ SOLN
5.0000 mL | INTRAMUSCULAR | Status: AC | PRN
  Administered 2019-12-29: 5 mL

## 2019-12-29 NOTE — Progress Notes (Signed)
Office Visit Note   Patient: Veronica Stanley           Date of Birth: [birth year]           MRN: [id number] Visit Date: 12/29/2019              Requested by: Venia Carbon, MD [place],  [place] [address] PCP: Venia Carbon, MD   Assessment & Plan: Visit Diagnoses:  1. Primary osteoarthritis of right knee   2. Chronic pain of left knee     Plan: She will work on Forensic scientist.  Quad exercises were reviewed the patient today.  Knee friendly exercises also discussed with patient.  Discussed the use of Voltaren gel to both knees up to 4 g 4 times daily.  She understands she needs to wait at least 3 months between cortisone injections in the knees.  If she continues to have mechanical symptoms of the left knee may consider MRI to rule out meniscal tear.  See her back in 2 weeks see what type of response she had to the injection.  Follow-Up Instructions: Return in about 2 weeks (around 01/12/2020).   Orders:  Orders Placed This Encounter  Procedures  . Large Joint Inj  . XR Knee 1-2 Views Left  . XR Knee 1-2 Views Right   No orders of the defined types were placed in this encounter.     Procedures: Large Joint Inj: bilateral knee on 12/29/2019 11:56 AM Indications: pain Details: 22 G 1.5 in needle, anterolateral approach  Arthrogram: No  Medications (Right): 5 mL lidocaine 1 %; 40 mg methylPREDNISolone acetate 40 MG/ML Aspirate (Right): 7 mL yellow Medications (Left): 0.5 mL lidocaine 1 %; 40 mg methylPREDNISolone acetate 40 MG/ML Outcome: tolerated well, no immediate complications Procedure, treatment alternatives, risks and benefits explained, specific risks discussed. Consent was given by the patient. Immediately prior to procedure a time out was called to verify the correct patient, procedure, equipment, support staff and site/side marked as required. Patient was prepped and draped in the usual sterile fashion.       Clinical Data: No  additional findings.   Subjective: Chief Complaint  Patient presents with  . Right Knee - Pain  . Left Knee - Pain    HPI [name] is well-known to our department service comes in today due to bilateral knee pain left greater than right.  She is requesting injections both knees.  She is also in rest and supplemental injections in the knees.  She states that over the last 3 to 4 months she has had left knee pain.  She is having some trouble sleeping due to the knee pain.  No catching, locking or giving way of the left knee.  However she does have a painful popping in the knee at times.  No particular injury she did stay in the [place] beach house in [place] and had to pull her self up to go up and down steps and walk sideways at times due to the positioning of bedding.  She also having pain in her right knee.  She has known osteoarthritis right knee.  History of the left total hip arthroplasty which is doing well.  Review of Systems See HPI otherwise negative  Objective: Vital Signs: LMP  (LMP Unknown)   Physical Exam General: Well-developed well-nourished pleasant female in no acute distress. Psych: Alert and oriented x3. Ortho Exam Bilateral knees right knee no abnormal warmth erythema or effusion.  Left knee slight effusion.  No abnormal warmth or erythema of the left knee.  No instability valgus varus stressing either knee.  Positive McMurray's left knee only.  Good range of motion of both both knees.  She has tenderness over the medial lateral joint line of the left knee.  Tenderness over the medial joint line of the right knee. Specialty Comments:  No specialty comments available.  Imaging: XR Knee 1-2 Views Left  Result Date: 12/29/2019 Left knee 2 views: No acute fractures or acute findings.  Mild medial compartmental narrowing.  Severe patellofemoral changes.  Lateral compartment well-preserved.  Knee is well located.  XR Knee 1-2 Views Right  Result Date: 12/29/2019 Right knee  near bone-on-bone medial compartment.  Lateral compartment is well-preserved.  Severe patellofemoral changes.  No acute fractures or acute findings.    PMFS History: Patient Active Problem List   Diagnosis Date Noted  . Obstructive sleep apnea 04/17/2018  . Sleep disorder 03/13/2018  . PAD (peripheral artery disease) ([facility]) 02/28/2018  . Morbid obesity ([facility]) 11/28/2017  . 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 Unilateral primary osteoarthritis, left hip 09/17/2017  . Osteoarthritis of both hips 11/10/2015  . Advance directive discussed with patient 11/03/2014  . Routine general medical examination at a health care facility 03/03/2011  . Osteoarthritis, multiple sites   . GERD 10/11/2007  . ACTINIC KERATOSIS 04/17/2007  . Essential hypertension, benign 03/28/2007  . DIVERTICULOSIS, COLON 03/28/2007  . OSTEOPOROSIS 03/28/2007   Past Medical History:  Diagnosis Date  . Basal cell carcinoma, arm 11/13   on arm and face   . Diverticulitis    colon  . GERD (gastroesophageal reflux disease)   . Hypertension    patient denies not on meds   . Menopausal symptoms   . Osteoarthrosis involving, or with mention of more than one site, but not specified as generalized, multiple sites   . Osteoporosis   . Peripheral vascular disease ([facility])     Family History  Problem Relation Age of Onset  . Osteoporosis Mother   . Coronary artery disease Father   . Breast cancer Sister 85       twice  . Cancer Maternal Grandfather        colon cancer  . Breast cancer Other        first maternal cousin ?  . Diabetes Neg Hx   . Hypertension Neg Hx     Past Surgical History:  Procedure Laterality Date  . ABDOMINAL HYSTERECTOMY    . BREAST BIOPSY Left 2003   Left breast calcifications benign  . COLONOSCOPY WITH PROPOFOL N/A 04/22/2018   Procedure: COLONOSCOPY WITH PROPOFOL;  Surgeon: Manya Silvas, MD;  Location: [facility] ENDOSCOPY;  Service: Endoscopy;  Laterality: N/A;  .  DILATION AND CURETTAGE OF UTERUS     rupture- peritonitis & hysterectomy 12/79  . TOTAL HIP ARTHROPLASTY Left 10/19/2017   Procedure: LEFT TOTAL HIP ARTHROPLASTY ANTERIOR APPROACH;  Surgeon: Mcarthur Rossetti, MD;  Location: [facility] ORS;  Service: Orthopedics;  Laterality: Left;  Marland Kitchen VAGINAL DELIVERY     x2   Social History   Occupational History  . 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 Occupation:    Tobacco Use  . Smoking status: Never Smoker  . Smokeless tobacco: Never Used  Vaping Use  . Vaping Use: Never used  Substance and Sexual Activity  . Alcohol use: Yes    Alcohol/week: 3.0 standard drinks  Types: 3 Glasses of wine per week  . Drug use: Never  . Sexual activity: Not on file

## 2020-01-02 NOTE — Telephone Encounter (Signed)
Called and spoke with pt letting her know the results of the HST. Pt verbalized understanding. Pt does have an appt scheduled with VS 8/13 to discuss the results which she stated she would  Keep. Nothing further needed.

## 2020-01-02 NOTE — Telephone Encounter (Signed)
Pt returning a phone call. Pt can be reached at [phone].

## 2020-01-15 ENCOUNTER — Ambulatory Visit: Payer: Medicare PPO | Admitting: Physician Assistant

## 2020-01-30 ENCOUNTER — Encounter: Payer: Self-pay | Admitting: Pulmonary Disease

## 2020-01-30 ENCOUNTER — Ambulatory Visit: Payer: Medicare PPO | Admitting: Pulmonary Disease

## 2020-01-30 ENCOUNTER — Other Ambulatory Visit: Payer: Self-pay

## 2020-01-30 VITALS — BP 126/80 | HR 77 | Temp 96.9°F | Ht 67.5 in | Wt 237.0 lb

## 2020-01-30 DIAGNOSIS — Z7189 Other specified counseling: Secondary | ICD-10-CM | POA: Diagnosis not present

## 2020-01-30 DIAGNOSIS — G4733 Obstructive sleep apnea (adult) (pediatric): Secondary | ICD-10-CM | POA: Diagnosis not present

## 2020-01-30 NOTE — Progress Notes (Signed)
[facility]  Chief Complaint  Patient presents with  . Follow-up    review sleep study-     Constitutional:  BP 126/80 (BP Location: Left Arm, Cuff Size: Normal)   Pulse 77   Temp (!) 96.9 F (36.1 C) (Temporal)   Ht 5' 7.5" (1.715 m)   Wt 237 lb (107.5 kg)   LMP  (LMP Unknown)   SpO2 96%   BMI 36.57 kg/m   Past Medical History:  Diverticulitis, GERD, HTN, OA, PAD  Summary:  Veronica Stanley is a 74 y.o. female with obstructive sleep apnea.  Subjective:   She had in lab sleep study.  Showed severe obstructive sleep apnea.  She still has CPAP machine.  Hasn't used in about 6 months.  Her tube and mask are old.  She isn't sure when the filter was changed last.  She has appointment her dentist next week.  Physical Exam:   Appearance - well kempt   ENMT - no sinus tenderness, no oral exudate, no LAN, Mallampati 2 airway, no stridor  Respiratory - equal breath sounds bilaterally, no wheezing or rales  CV - s1s2 regular rate and rhythm, no murmurs  Ext - no clubbing, no edema  Skin - no rashes  Psych - normal mood and affect   Assessment/Plan:   Obstructive sleep apnea. - reviewed her sleep study  - discussed treatment options - she will discuss oral appliance as option with her dentist later this month - will arrange for [employer] to refit her CPAP mask and arrange for new supplies; she will then resume using CPAP  Obesity. - discussed how weight can impact sleep and risk for sleep disordered breathing - discussed options to assist with weight loss: combination of diet modification, cardiovascular and strength training exercises  A total of  22 minutes spent addressing patient care issues on day of visit.   Follow up:  Patient Instructions  Will arrange for medical supply company to get new CPAP mask and supplies  Follow up in 2 months   Signature:  Chesley Mires, MD [place] Pager:  [phone] 01/30/2020, 11:27 AM  Flow Sheet    Sleep tests:   HST 03/25/18 >> AHI 47.7, SpO2 low 66%  PSG 11/12/19 >> AHI 36, SpO2 low 60%  Cardiac tests:   Echo 11/26/15 >> EF 55 to 60%  Medications:   Allergies as of 01/30/2020      Reactions   Penicillins Anaphylaxis, Shortness Of Breath, Swelling, Rash   Has patient had a PCN reaction causing immediate rash, facial/tongue/throat swelling, SOB or lightheadedness with hypotension: Yes Has patient had a PCN reaction causing severe rash involving mucus membranes or skin necrosis: Yes Has patient had a PCN reaction that required hospitalization: No Has patient had a PCN reaction occurring within the last 10 years: No If all of the above answers are "NO", then may proceed with Cephalosporin use.   Ibandronate Sodium Other (See Comments)   aches      Medication List       Accurate as of January 30, 2020 11:27 AM. If you have any questions, ask your nurse or doctor.        ibuprofen 200 MG tablet Commonly known as: ADVIL Take 200 mg by mouth daily as needed.   omeprazole 20 MG capsule Commonly known as: PRILOSEC TAKE ONE CAPSULE BY MOUTH DAILY AS NEEDED FOR HEARTBURN OR INDIGESTION.   TURMERIC PO Take 900 mg by mouth daily.  Vitamin D3 25 MCG (1000 UT) Caps Take 1 capsule by mouth daily.       Past Surgical History:  She  has a past surgical history that includes Dilation and curettage of uterus; Vaginal delivery; Abdominal hysterectomy; Total hip arthroplasty (Left, 10/19/2017); Colonoscopy with propofol (N/A, 04/22/2018); and Breast biopsy (Left, 2003).  Family History:  Her family history includes Breast cancer in an other family member; Breast cancer (age of onset: 74) in her sister; Cancer in her maternal grandfather; Coronary artery disease in her father; Osteoporosis in her mother.  Social History:  She  reports that she has never smoked. She has never used smokeless tobacco. She reports current alcohol use  of about 3.0 standard drinks of alcohol per week. She reports that she does not use drugs.

## 2020-01-30 NOTE — Patient Instructions (Signed)
Will arrange for medical supply company to get new CPAP mask and supplies  Follow up in 2 months

## 2020-02-10 ENCOUNTER — Encounter: Payer: Self-pay | Admitting: Internal Medicine

## 2020-02-10 ENCOUNTER — Other Ambulatory Visit: Payer: Self-pay

## 2020-02-10 ENCOUNTER — Ambulatory Visit (INDEPENDENT_AMBULATORY_CARE_PROVIDER_SITE_OTHER): Payer: Medicare PPO | Admitting: Internal Medicine

## 2020-02-10 VITALS — BP 124/86 | HR 76 | Temp 97.6°F | Ht 66.5 in | Wt 236.0 lb

## 2020-02-10 DIAGNOSIS — Z7189 Other specified counseling: Secondary | ICD-10-CM

## 2020-02-10 DIAGNOSIS — K219 Gastro-esophageal reflux disease without esophagitis: Secondary | ICD-10-CM

## 2020-02-10 DIAGNOSIS — Z0000 Encounter for general adult medical examination without abnormal findings: Secondary | ICD-10-CM | POA: Diagnosis not present

## 2020-02-10 DIAGNOSIS — G4733 Obstructive sleep apnea (adult) (pediatric): Secondary | ICD-10-CM | POA: Diagnosis not present

## 2020-02-10 DIAGNOSIS — M159 Polyosteoarthritis, unspecified: Secondary | ICD-10-CM

## 2020-02-10 DIAGNOSIS — I10 Essential (primary) hypertension: Secondary | ICD-10-CM

## 2020-02-10 DIAGNOSIS — M8949 Other hypertrophic osteoarthropathy, multiple sites: Secondary | ICD-10-CM

## 2020-02-10 LAB — CBC
HCT: 39.8 % (ref 36.0–46.0)
Hemoglobin: 13.3 g/dL (ref 12.0–15.0)
MCHC: 33.5 g/dL (ref 30.0–36.0)
MCV: 91 fl (ref 78.0–100.0)
Platelets: 253 10*3/uL (ref 150.0–400.0)
RBC: 4.38 Mil/uL (ref 3.87–5.11)
RDW: 13.7 % (ref 11.5–15.5)
WBC: 6 10*3/uL (ref 4.0–10.5)

## 2020-02-10 LAB — COMPREHENSIVE METABOLIC PANEL
ALT: 9 U/L (ref 0–35)
AST: 15 U/L (ref 0–37)
Albumin: 4.1 g/dL (ref 3.5–5.2)
Alkaline Phosphatase: 69 U/L (ref 39–117)
BUN: 16 mg/dL (ref 6–23)
CO2: 27 mEq/L (ref 19–32)
Calcium: 9.2 mg/dL (ref 8.4–10.5)
Chloride: 103 mEq/L (ref 96–112)
Creatinine, Ser: 0.75 mg/dL (ref 0.40–1.20)
GFR: 75.1 mL/min (ref >60.00)
Glucose, Bld: 95 mg/dL (ref 70–99)
Potassium: 4 mEq/L (ref 3.5–5.1)
Sodium: 138 mEq/L (ref 135–145)
Total Bilirubin: 0.5 mg/dL (ref 0.2–1.2)
Total Protein: 7.1 g/dL (ref 6.0–8.3)

## 2020-02-10 LAB — T4, FREE: Free T4: 0.83 ng/dL (ref 0.60–1.60)

## 2020-02-10 NOTE — Assessment & Plan Note (Signed)
See social history 

## 2020-02-10 NOTE — Assessment & Plan Note (Signed)
Needs to restart with the CPAP Awaiting equipment order from Dr Halford Chessman

## 2020-02-10 NOTE — Progress Notes (Signed)
Subjective:    Patient ID: Veronica Stanley, female    DOB: 1946/03/05, 74 y.o.   MRN: [id number]  HPI Here for Medicare wellness visit and follow up of chronic health conditions This visit occurred during the SARS-CoV-2 public health emergency.  Safety protocols were in place, including screening questions prior to the visit, additional usage of staff PPE, and extensive cleaning of exam room while observing appropriate contact time as indicated for disinfecting solutions.   Reviewed form and advanced directives Reviewed other doctors No tobacco Rare alcohol Some changes in vision---sees blurry (getting check up) Hearing is okay Occasional depressed mood--no anhedonia Hasn't been exercising--plans to restart [place] once in the garden--no sig injury Independent with instrumental ADLs No problems with memory  Still working 3 days per [employer] if she should stop Gets up at [employer] and will work till [employer] on work days Stopped exercising due to COVID---will be starting to [facility] back to [facility] [employer] This has affected her balance Some aggravation with work--- some enjoyment but not always  Has kept up with ortho Got aspiration from left knee and injections Will have follow up---?gel injection before TKR  Feet still turn colors---gray/purple No pain---even with exercise  Gets some acid symptoms Will have sense of catch in breath at times Did wean the PPI---discussed increasing again Throat does feel raw at times  Stopped CPAP in December Had repeat sleep study and needs new Rx for mask,etc  Weight is stable Plans to restart at the [employer] Tries to be careful with eating---but inconsistent  No chest pain No palpitaitons No significant edema--except once No headaches  Current Outpatient Medications on File Prior to Visit  Medication Sig Dispense Refill  . Cholecalciferol (VITAMIN D3) 1000 units CAPS Take 1 capsule by mouth daily.    . Multiple Vitamin (MULTIVITAMIN) tablet Take 1 tablet by  mouth daily.    . Omega 3 1000 MG CAPS Take by mouth.    Marland Kitchen omeprazole (PRILOSEC) 20 MG capsule TAKE ONE CAPSULE BY MOUTH DAILY AS NEEDED FOR HEARTBURN OR INDIGESTION. 30 capsule 2  . Zinc 50 MG TABS Take by mouth.    Marland Kitchen ibuprofen (ADVIL,MOTRIN) 200 MG tablet Take 200 mg by mouth daily as needed. (Patient not taking: Reported on 02/10/2020)     No current facility-administered medications on file prior to visit.    Allergies  Allergen Reactions  . Penicillins Anaphylaxis, Shortness Of Breath, Swelling and Rash    Has patient had a PCN reaction causing immediate rash, facial/tongue/throat swelling, SOB or lightheadedness with hypotension: Yes Has patient had a PCN reaction causing severe rash involving mucus membranes or skin necrosis: Yes Has patient had a PCN reaction that required hospitalization: No Has patient had a PCN reaction occurring within the last 10 years: No If all of the above answers are "NO", then may proceed with Cephalosporin use.   . Ibandronate Sodium Other (See Comments)    aches    Past Medical History:  Diagnosis Date  . Basal cell carcinoma, arm 11/13   on arm and face   . Diverticulitis    colon  . GERD (gastroesophageal reflux disease)   . Hypertension    patient denies not on meds   . Menopausal symptoms   . Osteoarthrosis involving, or with mention of more than one site, but not specified as generalized, multiple sites   . Osteoporosis   . Peripheral vascular disease [facility])     Past Surgical History:  Procedure Laterality Date  .  ABDOMINAL HYSTERECTOMY    . BREAST BIOPSY Left 2003   Left breast calcifications benign  . COLONOSCOPY WITH PROPOFOL N/A 04/22/2018   Procedure: COLONOSCOPY WITH PROPOFOL;  Surgeon: Manya Silvas, MD;  Location: [facility] ENDOSCOPY;  Service: Endoscopy;  Laterality: N/A;  . DILATION AND CURETTAGE OF UTERUS     rupture- peritonitis & hysterectomy 12/79  . TOTAL HIP ARTHROPLASTY Left 10/19/2017   Procedure: LEFT TOTAL HIP  ARTHROPLASTY ANTERIOR APPROACH;  Surgeon: Mcarthur Rossetti, MD;  Location: [facility] ORS;  Service: Orthopedics;  Laterality: Left;  Marland Kitchen VAGINAL DELIVERY     x2    Family History  Problem Relation Age of Onset  . Osteoporosis Mother   . Coronary artery disease Father   . Breast cancer Sister 21       twice  . Cancer Maternal Grandfather        colon cancer  . Breast cancer Other        first maternal cousin ?  . Diabetes Neg Hx   . Hypertension Neg Hx     Social History   Socioeconomic History  . Marital status: Divorced    Spouse name: Not on file  . Number of children: 2  . Years of education: Not on file  . Highest education level: Not on file  Occupational History  . 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 Occupation:    Tobacco Use  . Smoking status: Never Smoker  . Smokeless tobacco: Never Used  Vaping Use  . Vaping Use: Never used  Substance and Sexual Activity  . Alcohol use: Yes    Alcohol/week: 3.0 standard drinks    Types: 3 Glasses of wine per week  . Drug use: Never  . Sexual activity: Not on file  Other Topics Concern  . Not on file  Social History Narrative   No living will   Would want daughter, Margarita Grizzle,  Then son Ginnie Smart to make health care decisions for her   Would accept resuscitation attempts   Not sure about tube feedings   Social Determinants of Health   Financial Resource Strain:   . Difficulty of Paying Living Expenses: Not on file  Food Insecurity:   . Worried About Charity fundraiser in the Last Year: Not on file  . Ran Out of Food in the Last Year: Not on file  Transportation Needs:   . Lack of Transportation (Medical): Not on file  . Lack of Transportation (Non-Medical): Not on file  Physical Activity:   . Days of Exercise per Week: Not on file  . Minutes of Exercise per Session: Not on file  Stress:   . Feeling of Stress : Not on file  Social Connections:   . Frequency of Communication  with Friends and Family: Not on file  . Frequency of Social Gatherings with Friends and Family: Not on file  . Attends Religious Services: Not on file  . Active Member of Clubs or Organizations: Not on file  . Attends Archivist Meetings: Not on file  . Marital Status: Not on file  Intimate Partner Violence:   . Fear of Current or Ex-Partner: Not on file  . Emotionally Abused: Not on file  . Physically Abused: Not on file  . Sexually Abused: Not on file   Review of Systems Wears seat belt Teeth are fine--- keeps up with dentist (will need 2 crowns next year) No suspicious skin lesions now Bowels fine--no blood Voids  fine---some urge incontinence (wears pad at work) No other joint issues---just knees    Objective:   Physical Exam Constitutional:      Appearance: Normal appearance.  HENT:     Mouth/Throat:     Comments: No lesions Eyes:     Conjunctiva/sclera: Conjunctivae normal.     Pupils: Pupils are equal, round, and reactive to light.  Cardiovascular:     Rate and Rhythm: Normal rate and regular rhythm.     Pulses: Normal pulses.     Heart sounds: No murmur heard.  No gallop.   Pulmonary:     Effort: Pulmonary effort is normal.     Breath sounds: Normal breath sounds. No wheezing or rales.  Abdominal:     Palpations: Abdomen is soft.     Tenderness: There is no abdominal tenderness.  Musculoskeletal:     Cervical back: Neck supple.     Right lower leg: No edema.     Left lower leg: No edema.  Lymphadenopathy:     Cervical: No cervical adenopathy.  Skin:    Findings: No rash.  Neurological:     Mental Status: She is alert and oriented to person, place, and time.     Comments: President--- "Charlette Caffey---- Obama" [phone] [id number] Recall 3/3  Psychiatric:        Mood and Affect: Mood normal.        Behavior: Behavior normal.            Assessment & Plan:

## 2020-02-10 NOTE — Assessment & Plan Note (Signed)
I have personally reviewed the Medicare Annual Wellness questionnaire and have noted 1. The patient's medical and social history 2. Their use of alcohol, tobacco or illicit drugs 3. Their current medications and supplements 4. The patient's functional ability including ADL's, fall risks, home safety risks and hearing or visual             impairment. 5. Diet and physical activities 6. Evidence for depression or mood disorders  The patients weight, height, BMI and visual acuity have been recorded in the chart I have made referrals, counseling and provided education to the patient based review of the above and I have provided the pt with a written personalized care plan for preventive services.  I have provided you with a copy of your personalized plan for preventive services. Please take the time to review along with your updated medication list.  Done with colons---fine in 2019 Yearly mammograms till 78 Flu vaccine in the fall --and COVID booster Consider shingrix at pharmacy Needs to get back to the gym

## 2020-02-10 NOTE — Assessment & Plan Note (Signed)
BP Readings from Last 3 Encounters:  02/10/20 124/86  01/30/20 126/80  10/09/19 124/80   Good control without meds

## 2020-02-10 NOTE — Assessment & Plan Note (Signed)
BMI 37 with arthritis, HTN, sleep apnea Discussed fitness, eating, etc

## 2020-02-10 NOTE — Assessment & Plan Note (Signed)
Needs to go back to daily PPI

## 2020-02-10 NOTE — Assessment & Plan Note (Signed)
Mostly knees May need TKR but discussed need for weight loss and quad strengthening before

## 2020-02-13 NOTE — Telephone Encounter (Signed)
PCCs, please see mychart message which I have posted below and advise:   To: [facility] POOL    From: Veronica Stanley    Created: 02/13/2020 10:30 AM     *-*-*This message was handled on 02/13/2020 10:33 AM by Waqas Bruhl P*-*-*  Have not heard from anyone about new mask or filter for my machine in order for me to start using it again. Advise please.. can clean what I have and start ?Marland Kitchen       There was an order placed 8/13.

## 2020-02-16 NOTE — Telephone Encounter (Signed)
I left a message on Veronica Stanley's cell to check status of order.

## 2020-02-16 NOTE — Telephone Encounter (Signed)
Leroy Sea called me back and left a vm.  He states order verified & went thru to cpap resupply team.  He is going to send an e-mail to them to see what status is and if pt hasn't received supplies yet to ask them to reach out to pt.  I does usually take up to 10 business days for order to process.  Will route back to triage so they can make pt aware thru MyChart that if supplies haven't been received she should be hearing from [employer] soon.

## 2020-03-01 DIAGNOSIS — G4733 Obstructive sleep apnea (adult) (pediatric): Secondary | ICD-10-CM | POA: Diagnosis not present

## 2020-04-01 ENCOUNTER — Encounter: Payer: Self-pay | Admitting: Physician Assistant

## 2020-04-01 ENCOUNTER — Ambulatory Visit: Payer: Medicare PPO | Admitting: Physician Assistant

## 2020-04-01 DIAGNOSIS — M1712 Unilateral primary osteoarthritis, left knee: Secondary | ICD-10-CM | POA: Diagnosis not present

## 2020-04-01 DIAGNOSIS — M170 Bilateral primary osteoarthritis of knee: Secondary | ICD-10-CM | POA: Diagnosis not present

## 2020-04-01 DIAGNOSIS — M1711 Unilateral primary osteoarthritis, right knee: Secondary | ICD-10-CM | POA: Diagnosis not present

## 2020-04-01 NOTE — Progress Notes (Signed)
HPI: [name] comes in today status post bilateral knee cortisone injection 12/29/2019.  She states that the injections were helpful for short period of time but she is having pain in both knees is wanting gel injections in both knees.  Most of her pain is medially both knees.  Pain is worse whenever she first gets up in the morning.  Patient has known arthritis of both knees with the right knee being worse on radiograph with near bone-on-bone medial compartment and severe patellofemoral changes both knees.  Review of systems: No fevers or chills.  Please see HPI otherwise negative or noncontributory.  Physical exam: Bilateral knees no abnormal warmth or erythema.  Tenderness medial joint line of both knees.  Overall good range of motion both knees.   Impression: Bilateral knee osteoarthritis  Plan: We will try to gain approval for supplemental injections in both knees have her back when these are available.  Questions were encouraged and answered both the patient and her daughters present during examination today.

## 2020-04-05 ENCOUNTER — Telehealth: Payer: Self-pay

## 2020-04-05 NOTE — Telephone Encounter (Signed)
Noted  

## 2020-04-05 NOTE — Telephone Encounter (Signed)
Please submit for bil knee gel inj

## 2020-04-12 ENCOUNTER — Emergency Department (HOSPITAL_COMMUNITY): Payer: Medicare PPO

## 2020-04-12 ENCOUNTER — Ambulatory Visit (INDEPENDENT_AMBULATORY_CARE_PROVIDER_SITE_OTHER): Payer: Medicare PPO | Admitting: Family Medicine

## 2020-04-12 ENCOUNTER — Emergency Department (HOSPITAL_COMMUNITY)
Admission: EM | Admit: 2020-04-12 | Discharge: 2020-04-12 | Disposition: A | Discharge status: Home / Self Care | Payer: Medicare PPO | Attending: Emergency Medicine | Admitting: Emergency Medicine

## 2020-04-12 ENCOUNTER — Emergency Department (HOSPITAL_BASED_OUTPATIENT_CLINIC_OR_DEPARTMENT_OTHER): Payer: Medicare PPO

## 2020-04-12 ENCOUNTER — Other Ambulatory Visit: Payer: Self-pay

## 2020-04-12 ENCOUNTER — Encounter: Payer: Self-pay | Admitting: Family Medicine

## 2020-04-12 ENCOUNTER — Encounter (HOSPITAL_COMMUNITY): Payer: Self-pay | Admitting: Emergency Medicine

## 2020-04-12 VITALS — BP 130/90 | HR 150 | Temp 97.4°F | Ht 66.5 in | Wt 242.5 lb

## 2020-04-12 DIAGNOSIS — Z7901 Long term (current) use of anticoagulants: Secondary | ICD-10-CM | POA: Diagnosis not present

## 2020-04-12 DIAGNOSIS — Z20822 Contact with and (suspected) exposure to covid-19: Secondary | ICD-10-CM | POA: Insufficient documentation

## 2020-04-12 DIAGNOSIS — I10 Essential (primary) hypertension: Secondary | ICD-10-CM | POA: Insufficient documentation

## 2020-04-12 DIAGNOSIS — R000 Tachycardia, unspecified: Secondary | ICD-10-CM | POA: Diagnosis not present

## 2020-04-12 DIAGNOSIS — R0602 Shortness of breath: Secondary | ICD-10-CM | POA: Diagnosis not present

## 2020-04-12 DIAGNOSIS — R609 Edema, unspecified: Secondary | ICD-10-CM

## 2020-04-12 DIAGNOSIS — R600 Localized edema: Secondary | ICD-10-CM

## 2020-04-12 DIAGNOSIS — M79662 Pain in left lower leg: Secondary | ICD-10-CM | POA: Diagnosis not present

## 2020-04-12 DIAGNOSIS — I480 Paroxysmal atrial fibrillation: Secondary | ICD-10-CM | POA: Diagnosis not present

## 2020-04-12 DIAGNOSIS — M7989 Other specified soft tissue disorders: Secondary | ICD-10-CM

## 2020-04-12 DIAGNOSIS — I472 Ventricular tachycardia, unspecified: Secondary | ICD-10-CM

## 2020-04-12 DIAGNOSIS — Z96642 Presence of left artificial hip joint: Secondary | ICD-10-CM | POA: Insufficient documentation

## 2020-04-12 DIAGNOSIS — I4891 Unspecified atrial fibrillation: Secondary | ICD-10-CM | POA: Insufficient documentation

## 2020-04-12 DIAGNOSIS — R2242 Localized swelling, mass and lump, left lower limb: Secondary | ICD-10-CM | POA: Insufficient documentation

## 2020-04-12 DIAGNOSIS — E6609 Other obesity due to excess calories: Secondary | ICD-10-CM | POA: Diagnosis not present

## 2020-04-12 DIAGNOSIS — I471 Supraventricular tachycardia, unspecified: Secondary | ICD-10-CM

## 2020-04-12 DIAGNOSIS — Z85828 Personal history of other malignant neoplasm of skin: Secondary | ICD-10-CM | POA: Diagnosis not present

## 2020-04-12 DIAGNOSIS — K219 Gastro-esophageal reflux disease without esophagitis: Secondary | ICD-10-CM | POA: Diagnosis not present

## 2020-04-12 LAB — BASIC METABOLIC PANEL
Anion gap: 9 (ref 5–15)
BUN: 18 mg/dL (ref 8–23)
CO2: 27 mmol/L (ref 22–32)
Calcium: 9.3 mg/dL (ref 8.9–10.3)
Chloride: 104 mmol/L (ref 98–111)
Creatinine, Ser: 0.77 mg/dL (ref 0.44–1.00)
GFR, Estimated: >60 mL/min (ref >60)
Glucose, Bld: 103 mg/dL — ABNORMAL HIGH (ref 70–99)
Potassium: 4.5 mmol/L (ref 3.5–5.1)
Sodium: 140 mmol/L (ref 135–145)

## 2020-04-12 LAB — URINALYSIS, ROUTINE W REFLEX MICROSCOPIC
Bilirubin Urine: NEGATIVE
Glucose, UA: NEGATIVE mg/dL
Ketones, ur: NEGATIVE mg/dL
Leukocytes,Ua: NEGATIVE
Nitrite: NEGATIVE
Protein, ur: NEGATIVE mg/dL
Specific Gravity, Urine: 1.009 (ref 1.005–1.030)
pH: 7 (ref 5.0–8.0)

## 2020-04-12 LAB — RESPIRATORY PANEL BY RT PCR (FLU A&B, COVID)
Influenza A by PCR: NEGATIVE
Influenza B by PCR: NEGATIVE
SARS Coronavirus 2 by RT PCR: NEGATIVE

## 2020-04-12 LAB — TSH: TSH: 3.454 u[IU]/mL (ref 0.350–4.500)

## 2020-04-12 LAB — CBC
HCT: 45.6 % (ref 36.0–46.0)
Hemoglobin: 14.2 g/dL (ref 12.0–15.0)
MCH: 29.8 pg (ref 26.0–34.0)
MCHC: 31.1 g/dL (ref 30.0–36.0)
MCV: 95.6 fL (ref 80.0–100.0)
Platelets: 279 10*3/uL (ref 150–400)
RBC: 4.77 MIL/uL (ref 3.87–5.11)
RDW: 12.8 % (ref 11.5–15.5)
WBC: 6.5 10*3/uL (ref 4.0–10.5)
nRBC: 0 % (ref 0.0–0.2)

## 2020-04-12 LAB — MAGNESIUM: Magnesium: 2.4 mg/dL (ref 1.7–2.4)

## 2020-04-12 LAB — TROPONIN I (HIGH SENSITIVITY)
Troponin I (High Sensitivity): 7 ng/L (ref <18)
Troponin I (High Sensitivity): 7 ng/L (ref <18)

## 2020-04-12 LAB — PHOSPHORUS: Phosphorus: 3.1 mg/dL (ref 2.5–4.6)

## 2020-04-12 MED ORDER — DILTIAZEM LOAD VIA INFUSION
15.0000 mg | Freq: Once | INTRAVENOUS | Status: DC
  Filled 2020-04-12: qty 15

## 2020-04-12 MED ORDER — DILTIAZEM HCL-DEXTROSE 125-5 MG/125ML-% IV SOLN (PREMIX)
5.0000 mg/h | INTRAVENOUS | Status: DC
  Filled 2020-04-12: qty 125

## 2020-04-12 MED ORDER — METOPROLOL SUCCINATE ER 25 MG PO TB24
25.0000 mg | ORAL_TABLET | Freq: Every day | ORAL | Status: DC
  Administered 2020-04-12: 25 mg via ORAL
  Filled 2020-04-12: qty 1

## 2020-04-12 MED ORDER — DILTIAZEM HCL 60 MG PO TABS: 60.0000 mg | ORAL_TABLET | Freq: Two times a day (BID) | ORAL | 1 refills | Status: DC

## 2020-04-12 MED ORDER — APIXABAN 5 MG PO TABS: 5.0000 mg | ORAL_TABLET | Freq: Two times a day (BID) | ORAL | 1 refills | Status: DC

## 2020-04-12 MED ORDER — METOPROLOL SUCCINATE ER 25 MG PO TB24: 25.0000 mg | ORAL_TABLET | Freq: Every day | ORAL | 1 refills | Status: DC

## 2020-04-12 MED ORDER — APIXABAN 5 MG PO TABS
5.0000 mg | ORAL_TABLET | Freq: Once | ORAL | Status: AC
  Administered 2020-04-12: 5 mg via ORAL
  Filled 2020-04-12: qty 1

## 2020-04-12 MED ORDER — DILTIAZEM HCL 60 MG PO TABS
60.0000 mg | ORAL_TABLET | Freq: Two times a day (BID) | ORAL | Status: DC
  Administered 2020-04-12: 60 mg via ORAL
  Filled 2020-04-12: qty 1

## 2020-04-12 NOTE — Progress Notes (Signed)
Lower extremity venous has been completed.   Preliminary results in CV Proc.   Abram Sander 04/12/2020 1:08 PM

## 2020-04-12 NOTE — Discharge Instructions (Addendum)
1.  Take Eliquis twice daily as prescribed.  Take metoprolol once in the afternoon.  Take Cardizem in the morning in the evening. 2.  See Dr. Doylene Canard for recheck in a week. 3.  Return to the emergency department if you get chest pain, lightheadedness, feeling like you will pass out or other concerning symptoms.

## 2020-04-12 NOTE — Consult Note (Signed)
Referring Physician:Dr. Christie Nottingham Veronica Stanley is an 74 y.o. female.                       Chief Complaint: Atrial fibrillation with RVR  HPI: 74 years old white female with PMH of HTN, GERD, Osteoarthritis and PVD has episodes of palpitations. She has h/o exertional dyspnea. Today she was found have tachycardia while at PCP office for knee and ankle pain. No h/o GI or GU bleed. EKG showed atrial fibrillation with RVR. She converted to NSR spontaneously.   Past Medical History:  Diagnosis Date  . Basal cell carcinoma, arm 11/13   on arm and face   . Diverticulitis    colon  . GERD (gastroesophageal reflux disease)   . Hypertension    patient denies not on meds   . Menopausal symptoms   . Osteoarthrosis involving, or with mention of more than one site, but not specified as generalized, multiple sites   . Osteoporosis   . Peripheral vascular disease [facility])       Past Surgical History:  Procedure Laterality Date  . ABDOMINAL HYSTERECTOMY    . BREAST BIOPSY Left 2003   Left breast calcifications benign  . COLONOSCOPY WITH PROPOFOL N/A 04/22/2018   Procedure: COLONOSCOPY WITH PROPOFOL;  Surgeon: Manya Silvas, MD;  Location: [facility] ENDOSCOPY;  Service: Endoscopy;  Laterality: N/A;  . DILATION AND CURETTAGE OF UTERUS     rupture- peritonitis & hysterectomy 12/79  . TOTAL HIP ARTHROPLASTY Left 10/19/2017   Procedure: LEFT TOTAL HIP ARTHROPLASTY ANTERIOR APPROACH;  Surgeon: Mcarthur Rossetti, MD;  Location: [facility] ORS;  Service: Orthopedics;  Laterality: Left;  Marland Kitchen VAGINAL DELIVERY     x2    Family History  Problem Relation Age of Onset  . Osteoporosis Mother   . Coronary artery disease Father   . Breast cancer Sister 54       twice  . Cancer Maternal Grandfather        colon cancer  . Breast cancer Other        first maternal cousin ?  . Diabetes Neg Hx   . Hypertension Neg Hx    Social History:  reports that she has never smoked. She has never used smokeless  tobacco. She reports current alcohol use of about 3.0 standard drinks of alcohol per week. She reports that she does not use drugs.  Allergies:  Allergies  Allergen Reactions  . Penicillins Anaphylaxis, Shortness Of Breath, Swelling and Rash    Has patient had a PCN reaction causing immediate rash, facial/tongue/throat swelling, SOB or lightheadedness with hypotension: Yes Has patient had a PCN reaction causing severe rash involving mucus membranes or skin necrosis: Yes Has patient had a PCN reaction that required hospitalization: No Has patient had a PCN reaction occurring within the last 10 years: No If all of the above answers are "NO", then may proceed with Cephalosporin use.   . Ibandronate Sodium Other (See Comments)    aches    (Not in a hospital admission)   Results for orders placed or performed during the hospital encounter of 04/12/20 (from the past 48 hour(s))  Basic metabolic panel     Status: Abnormal   Collection Time: 04/12/20 11:41 AM  Result Value Ref Range   Sodium 140 135 - 145 mmol/L   Potassium 4.5 3.5 - 5.1 mmol/L   Chloride 104 98 - 111 mmol/L   CO2 27 22 - 32 mmol/L  Glucose, Bld 103 (H) 70 - 99 mg/dL    Comment: Glucose reference range applies only to samples taken after fasting for at least 8 hours.   BUN 18 8 - 23 mg/dL   Creatinine, Ser 0.77 0.44 - 1.00 mg/dL   Calcium 9.3 8.9 - 10.3 mg/dL   GFR, Estimated >60 >60 mL/min    Comment: (NOTE) Calculated using the CKD-EPI Creatinine Equation (2021)    Anion gap 9 5 - 15    Comment: Performed at [facility] [address] [place] [address]  CBC     Status: None   Collection Time: 04/12/20 11:41 AM  Result Value Ref Range   WBC 6.5 4.0 - 10.5 K/uL   RBC 4.77 3.87 - 5.11 MIL/uL   Hemoglobin 14.2 12.0 - 15.0 g/dL   HCT 45.6 36 - 46 %   MCV 95.6 80.0 - 100.0 fL   MCH 29.8 26.0 - 34.0 pg   MCHC 31.1 30.0 - 36.0 g/dL   RDW 12.8 11.5 - 15.5 %   Platelets 279 150 - 400 K/uL   nRBC  0.0 0.0 - 0.2 %    Comment: Performed at [facility], [place] [address], [place], [place] [address]  Troponin Veronica (High Sensitivity)     Status: None   Collection Time: 04/12/20 11:41 AM  Result Value Ref Range   Troponin Veronica (High Sensitivity) 7 <18 ng/L    Comment: (NOTE) Elevated high sensitivity troponin Veronica (hsTnI) values and significant  changes across serial measurements may suggest ACS but many other  chronic and acute conditions are known to elevate hsTnI results.  Refer to the "Links" section for chest pain algorithms and additional  guidance. Performed at [facility], [place] [address], [place], [place] [address]   TSH     Status: None   Collection Time: 04/12/20 12:25 PM  Result Value Ref Range   TSH 3.454 0.350 - 4.500 uIU/mL    Comment: Performed by a 3rd Generation assay with a functional sensitivity of <=0.01 uIU/mL. Performed at [facility], [place] [address], [place], [place] [address]   Magnesium     Status: None   Collection Time: 04/12/20 12:26 PM  Result Value Ref Range   Magnesium 2.4 1.7 - 2.4 mg/dL    Comment: Performed at [facility], [place] [address] [place], [place] [address]  Phosphorus     Status: None   Collection Time: 04/12/20 12:26 PM  Result Value Ref Range   Phosphorus 3.1 2.5 - 4.6 mg/dL    Comment: Performed at [facility], [place] [address] [place], [place] [address]  Respiratory Panel by RT PCR (Flu A&B, Covid) - Nasopharyngeal Swab     Status: None   Collection Time: 04/12/20 12:31 PM   Specimen: Nasopharyngeal Swab  Result Value Ref Range   SARS Coronavirus 2 by RT PCR NEGATIVE NEGATIVE    Comment: (NOTE) SARS-CoV-2 target nucleic acids are NOT DETECTED.  The SARS-CoV-2 RNA is generally detectable in upper respiratoy specimens during the acute phase of infection. The lowest concentration of SARS-CoV-2 viral copies this assay can detect is 131 copies/mL. A negative result does not preclude SARS-Cov-2 infection and  should not be used as the sole basis for treatment or other patient management decisions. A negative result may occur with  improper specimen collection/handling, submission of specimen other than nasopharyngeal swab, presence of viral mutation(s) within the areas targeted by this  assay, and inadequate number of viral copies (<131 copies/mL). A negative result must be combined with clinical observations, patient history, and epidemiological information. The expected result is Negative.  Fact Sheet for Patients:  PinkCheek.be  Fact Sheet for Healthcare Providers:  GravelBags.it  This test is no t yet approved or cleared by the [place] FDA and  has been authorized for detection and/or diagnosis of SARS-CoV-2 by FDA under an Emergency Use Authorization (EUA). This EUA will remain  in effect (meaning this test can be used) for the duration of the COVID-19 declaration under Section 564(b)(1) of the Act, 21 U.S.C. section 360bbb-3(b)(1), unless the authorization is terminated or revoked sooner.     Influenza A by PCR NEGATIVE NEGATIVE   Influenza B by PCR NEGATIVE NEGATIVE    Comment: (NOTE) The Xpert Xpress SARS-CoV-2/FLU/RSV assay is intended as an aid in  the diagnosis of influenza from Nasopharyngeal swab specimens and  should not be used as a sole basis for treatment. Nasal washings and  aspirates are unacceptable for Xpert Xpress SARS-CoV-2/FLU/RSV  testing.  Fact Sheet for Patients: PinkCheek.be  Fact Sheet for Healthcare Providers: GravelBags.it  This test is not yet approved or cleared by the [place] FDA and  has been authorized for detection and/or diagnosis of SARS-CoV-2 by  FDA under an Emergency Use Authorization (EUA). This EUA will remain  in effect (meaning this test can be used) for the duration of the  Covid-19 declaration under Section  564(b)(1) of the Act, 21  U.S.C. section 360bbb-3(b)(1), unless the authorization is  terminated or revoked. Performed at [facility], [place] [address], [place], [place] [address]   Urinalysis, Routine w reflex microscopic Urine, Clean Catch     Status: Abnormal   Collection Time: 04/12/20 12:31 PM  Result Value Ref Range   Color, Urine STRAW (A) YELLOW   APPearance CLEAR CLEAR   Specific Gravity, Urine 1.009 1.005 - 1.030   pH 7.0 5.0 - 8.0   Glucose, UA NEGATIVE NEGATIVE mg/dL   Hgb urine dipstick SMALL (A) NEGATIVE   Bilirubin Urine NEGATIVE NEGATIVE   Ketones, ur NEGATIVE NEGATIVE mg/dL   Protein, ur NEGATIVE NEGATIVE mg/dL   Nitrite NEGATIVE NEGATIVE   Leukocytes,Ua NEGATIVE NEGATIVE   RBC / HPF 0-5 0 - 5 RBC/hpf   WBC, UA 0-5 0 - 5 WBC/hpf   Bacteria, UA RARE (A) NONE SEEN   Squamous Epithelial / LPF 0-5 0 - 5   Mucus PRESENT     Comment: Performed at [facility] [address] [place], [place] [address]  Troponin Veronica (High Sensitivity)     Status: None   Collection Time: 04/12/20  2:29 PM  Result Value Ref Range   Troponin Veronica (High Sensitivity) 7 <18 ng/L    Comment: (NOTE) Elevated high sensitivity troponin Veronica (hsTnI) values and significant  changes across serial measurements may suggest ACS but many other  chronic and acute conditions are known to elevate hsTnI results.  Refer to the "Links" section for chest pain algorithms and additional  guidance. Performed at [facility], [place] [address], [place],  [address]    DG Chest 2 View  Result Date: 04/12/2020 CLINICAL DATA:  Shortness of breath. EXAM: CHEST - 2 VIEW COMPARISON:  None. FINDINGS: The heart size and mediastinal contours are within normal limits. No focal airspace consolidation, pleural effusion, or pneumothorax. Degenerative changes within the thoracic spine. IMPRESSION: No active cardiopulmonary disease. Electronically Signed   By:  Nicholas  Plundo D.O.   On: 04/12/2020 12:17    VAS [name] LOWER EXTREMITY VENOUS (DVT) (ONLY [facility] & [facility])  Result Date: 04/12/2020  Lower Venous DVTStudy Indications: Edema, and Swelling.  Comparison Study: no prior Performing Technologist: Abram Sander RVS  Examination Guidelines: A complete evaluation includes B-mode imaging, spectral Doppler, color Doppler, and power Doppler as needed of all accessible portions of each vessel. Bilateral testing is considered an integral part of a complete examination. Limited examinations for reoccurring indications may be performed as noted. The reflux portion of the exam is performed with the patient in reverse Trendelenburg.  +-----+---------------+---------+-----------+----------+--------------+ RIGHTCompressibilityPhasicitySpontaneityPropertiesThrombus Aging +-----+---------------+---------+-----------+----------+--------------+ CFV  Full           Yes      Yes                                 +-----+---------------+---------+-----------+----------+--------------+   +---------+---------------+---------+-----------+----------+-------------------+ LEFT     CompressibilityPhasicitySpontaneityPropertiesThrombus Aging      +---------+---------------+---------+-----------+----------+-------------------+ CFV      Full           Yes      Yes                                      +---------+---------------+---------+-----------+----------+-------------------+ SFJ      Full                                                             +---------+---------------+---------+-----------+----------+-------------------+ FV Prox  Full                                                             +---------+---------------+---------+-----------+----------+-------------------+ FV Mid   Full                                                             +---------+---------------+---------+-----------+----------+-------------------+ FV DistalFull                                                              +---------+---------------+---------+-----------+----------+-------------------+ PFV      Full                                                             +---------+---------------+---------+-----------+----------+-------------------+ POP      Full           Yes      Yes                                      +---------+---------------+---------+-----------+----------+-------------------+  PTV      Full                                                             +---------+---------------+---------+-----------+----------+-------------------+ PERO                                                  not visualized well +---------+---------------+---------+-----------+----------+-------------------+     Summary: RIGHT: - No evidence of common femoral vein obstruction.  LEFT: - There is no evidence of deep vein thrombosis in the lower extremity.  - No cystic structure found in the popliteal fossa.  *See table(s) above for measurements and observations.    Preliminary     Review Of Systems Constitutional: No fever, chills, Chronic weight gain. Eyes: No vision change, wears glasses. No discharge or pain. Ears: No hearing loss, No tinnitus. Respiratory: No asthma, COPD, pneumonias. Positive shortness of breath. No hemoptysis. Cardiovascular: No chest pain, positive palpitation, leg edema. Gastrointestinal: No nausea, vomiting, diarrhea, constipation. No GI bleed. No hepatitis. Genitourinary: No dysuria, hematuria, kidney stone. No incontinance. Neurological: No headache, stroke, seizures.  Psychiatry: No psych facility admission for anxiety, depression, suicide. No detox. Skin: No rash. Musculoskeletal: Positive joint pain, fibromyalgia. No neck pain, back pain. Lymphadenopathy: No lymphadenopathy. Hematology: No anemia or easy bruising.   Blood pressure (!) 146/84, pulse 78, temperature 97.8 F (36.6 C), temperature source Oral, resp. rate 18, height 5' 6.5" (1.689 m),  weight 110 kg, SpO2 100 %. Body mass index is 38.56 kg/m. General appearance: alert, cooperative, appears stated age and no distress Head: Normocephalic, atraumatic. Eyes: Blue eyes, pink conjunctiva, corneas clear. PERRL, EOM's intact. Neck: No adenopathy, no carotid bruit, no JVD, supple, symmetrical, trachea midline and thyroid not enlarged. Resp: Clear to auscultation bilaterally. Cardio: Regular rate and rhythm, S1, S2 normal, II/VI systolic murmur, no click, rub or gallop GI: Soft, non-tender; bowel sounds normal; no organomegaly. Extremities: 1 + edema, no cyanosis or clubbing. Skin: Warm and dry.  Neurologic: Alert and oriented X 3, normal strength. Normal coordination and gait.  Assessment/Plan Paroxysmal atrial fibrillation with RVR, CHA2DS2VASc score of 4 HTN Obesity Arthritis GERD PVD  Agree with Eliquis, Toprol and diltiazem. F/U in 1 week.   Time spent: Review of old records, Lab, x-rays, EKG, other cardiac tests, examination, discussion with patient, family and ER physician over 60 minutes.  Birdie Riddle, MD  04/12/2020, 4:12 PM

## 2020-04-12 NOTE — ED Triage Notes (Signed)
Pt brought to ED by [facility] from PCP office where she went with c/o of bilateral knee and ankle pain and swollen. On provider's assessment pt's HR was on 150's to 170's, no SOB, no pain. BP 164/102, HR 150-172, SPO2 100% RA, no prior cardiac hx.

## 2020-04-12 NOTE — Progress Notes (Signed)
Martavius Lusty T. Jaesean Litzau, MD, [place] at [facility] [place], [address]  Phone: [phone]  FAX: [phone]  Veronica Stanley - 74 y.o. female  MRN [id number]  Date of Birth: 1945-12-29  Date: 04/12/2020  PCP: Venia Carbon, MD  Referral: Venia Carbon, MD  Chief Complaint  Patient presents with  . Joint Swelling    This visit occurred during the SARS-CoV-2 public health emergency.  Safety protocols were in place, including screening questions prior to the visit, additional usage of staff PPE, and extensive cleaning of exam room while observing appropriate contact time as indicated for disinfecting solutions.   Subjective:   Veronica Stanley is a 74 y.o. very pleasant female patient with Body mass index is 38.55 kg/m. who presents with the following:  She is a very pleasant 74 year old patient who presents today with some very significant tachycardia and some new onset lower extremity edema.  The edema has been present for approximately 2 weeks.  She did notice today that showed her pulse was going very rapidly today and on the drive to here in our office.  She has no known cardiac history.  Other significant relevant history include hypertension, obesity to BMI 39, sleep apnea, but otherwise she is a fairly healthy and active 74 year old.  She is in no acute distress here in the office today.  She is not having any chest pain.  No arm pain, shortness of breath.  No headache.  No sensation of of passing out.  No dizziness, no neurological changes.  No ataxia.  No numbness, tingling.  No signs of infection including no fever, chills, coughing, sinus or nasal congestion.  No nausea, vomiting, diarrhea.   Patient Active Problem List   Diagnosis Date Noted  . Obstructive sleep apnea 04/17/2018  . Sleep disorder 03/13/2018  . Morbid obesity ([place]) 11/28/2017  . Status post total  replacement of left hip 10/19/2017  . Unilateral primary osteoarthritis, left hip 09/17/2017  . Osteoarthritis of both hips 11/10/2015  . Advance directive discussed with patient 11/03/2014  . Routine general medical examination at a health care facility 03/03/2011  . Osteoarthritis, multiple sites   . GERD 10/11/2007  . ACTINIC KERATOSIS 04/17/2007  . Essential hypertension, benign 03/28/2007  . DIVERTICULOSIS, COLON 03/28/2007  . OSTEOPOROSIS 03/28/2007    Past Medical History:  Diagnosis Date  . Basal cell carcinoma, arm 11/13   on arm and face   . Diverticulitis    colon  . GERD (gastroesophageal reflux disease)   . Hypertension    patient denies not on meds   . Menopausal symptoms   . Osteoarthrosis involving, or with mention of more than one site, but not specified as generalized, multiple sites   . Osteoporosis   . Peripheral vascular disease [facility])     Past Surgical History:  Procedure Laterality Date  . ABDOMINAL HYSTERECTOMY    . BREAST BIOPSY Left 2003   Left breast calcifications benign  . COLONOSCOPY WITH PROPOFOL N/A 04/22/2018   Procedure: COLONOSCOPY WITH PROPOFOL;  Surgeon: Manya Silvas, MD;  Location: [facility] ENDOSCOPY;  Service: Endoscopy;  Laterality: N/A;  . DILATION AND CURETTAGE OF UTERUS     rupture- peritonitis & hysterectomy 12/79  . TOTAL HIP ARTHROPLASTY Left 10/19/2017   Procedure: LEFT TOTAL HIP ARTHROPLASTY ANTERIOR APPROACH;  Surgeon: Mcarthur Rossetti, MD;  Location: [facility] ORS;  Service: Orthopedics;  Laterality: Left;  .  VAGINAL DELIVERY     x2    Family History  Problem Relation Age of Onset  . Osteoporosis Mother   . Coronary artery disease Father   . Breast cancer Sister 68       twice  . Cancer Maternal Grandfather        colon cancer  . Breast cancer Other        first maternal cousin ?  . Diabetes Neg Hx   . Hypertension Neg Hx      Review of Systems is noted in the HPI, as appropriate   Objective:   BP 130/90    Pulse (!) 150   Temp (!) 97.4 F (36.3 C) (Temporal)   Ht 5' 6.5" (1.689 m)   Wt 242 lb 8 oz (110 kg)   LMP  (LMP Unknown)   SpO2 99%   BMI 38.55 kg/m    GEN: WDWN, NAD, Non-toxic HEENT: Atraumatic, Normocephalic. Neck supple. No masses. CV: Appears regular with a rate of 150.  This rate was checked multiple times manually by myself.  It appears to be regular. PULM: CTA B, no wheezes, crackles, rhonchi. No retractions. No resp. distress. No accessory muscle use. EXTR: No c/c/1-2+ lower extremity edema.   NEURO Normal gait.  PSYCH: Normally interactive. Conversant.    Radiology: Results for orders placed or performed in visit on 02/10/20  Comprehensive metabolic panel  Result Value Ref Range   Sodium 138 135 - 145 mEq/L   Potassium 4.0 3.5 - 5.1 mEq/L   Chloride 103 96 - 112 mEq/L   CO2 27 19 - 32 mEq/L   Glucose, Bld 95 70 - 99 mg/dL   BUN 16 6 - 23 mg/dL   Creatinine, Ser 0.75 0.40 - 1.20 mg/dL   Total Bilirubin 0.5 0.2 - 1.2 mg/dL   Alkaline Phosphatase 69 39 - 117 U/L   AST 15 0 - 37 U/L   ALT 9 0 - 35 U/L   Total Protein 7.1 6.0 - 8.3 g/dL   Albumin 4.1 3.5 - 5.2 g/dL   GFR 75.10 >60.00 mL/min   Calcium 9.2 8.4 - 10.5 mg/dL  T4, free  Result Value Ref Range   Free T4 0.83 0.60 - 1.60 ng/dL  CBC  Result Value Ref Range   WBC 6.0 4.0 - 10.5 K/uL   RBC 4.38 3.87 - 5.11 Mil/uL   Platelets 253.0 150 - 400 K/uL   Hemoglobin 13.3 12.0 - 15.0 g/dL   HCT 39.8 36 - 46 %   MCV 91.0 78.0 - 100.0 fl   MCHC 33.5 30.0 - 36.0 g/dL   RDW 13.7 11.5 - 15.5 %    Lab Review:  CBC EXTENDED Latest Ref Rng & Units 02/10/2020 12/11/2018 11/28/2017  WBC 4.0 - 10.5 K/uL 6.0 5.8 5.7  RBC 3.87 - 5.11 Mil/uL 4.38 4.28 4.06  HGB 12.0 - 15.0 g/dL 13.3 12.6 11.4(L)  HCT 36 - 46 % 39.8 38.5 35.5(L)  PLT 150 - 400 K/uL 253.0 252.0 327.0  NEUTROABS 1.4 - 7.7 K/uL - - -  LYMPHSABS 0.7 - 4.0 K/uL - - -    BMP Latest Ref Rng & Units 02/10/2020 12/11/2018 03/13/2018  Glucose 70 - 99 mg/dL 95  92 96  BUN 6 - 23 mg/dL 16 12 -  Creatinine 0.40 - 1.20 mg/dL 0.75 0.70 -  Sodium 135 - 145 mEq/L 138 141 -  Potassium 3.5 - 5.1 mEq/L 4.0 5.0 -  Chloride 96 - 112 mEq/L 103  105 -  CO2 19 - 32 mEq/L 27 30 -  Calcium 8.4 - 10.5 mg/dL 9.2 9.1 -    Hepatic Function Latest Ref Rng & Units 02/10/2020 12/11/2018 11/28/2017  Total Protein 6.0 - 8.3 g/dL 7.1 7.0 7.5  Albumin 3.5 - 5.2 g/dL 4.1 4.0 3.8  AST 0 - 37 U/L 15 13 13   ALT 0 - 35 U/L 9 9 8   Alk Phosphatase 39 - 117 U/L 69 66 84  Total Bilirubin 0.2 - 1.2 mg/dL 0.5 0.5 0.4  Bilirubin, Direct 0.0 - 0.3 mg/dL - - -    Lab Results  Component Value Date   CHOL 179 03/13/2018   Lab Results  Component Value Date   HDL 49.30 03/13/2018   Lab Results  Component Value Date   LDLCALC 102 (H) 03/13/2018   Lab Results  Component Value Date   TRIG 138.0 03/13/2018   Lab Results  Component Value Date   CHOLHDL 4 03/13/2018   No results for input(s): PSA in the last 72 hours. Lab Results  Component Value Date   HCVAB NEGATIVE 11/10/2015   No results found for: VD25OH   No results found for: HGBA1C Lab Results  Component Value Date   MICROALBUR 2.5 (H) 02/25/2010   LDLCALC 102 (H) 03/13/2018   CREATININE 0.75 02/10/2020    Assessment and Plan:     ICD-10-CM   1. Ventricular tachycardia (HCC)  I47.2   2. Paroxysmal supraventricular tachycardia (HCC)  I47.1 EKG 12-Lead  3. Bilateral lower extremity edema  R60.0    Total encounter time: 40 minutes. This includes total time spent on the day of encounter.   EKG: This appears regular on EKG.  Rate is 150.  I do not appreciate any acute ST depression or elevation.  New onset ventricle tachycardia.  I believe that this is ventricular.  A flutter with RVR is also possible.  Nevertheless, she needs a higher level of care and we can offer on an outpatient basis.  I am activating the EMS system to help ensure she can get to the ER.  I do not think that she should drive an  automobile currently with a pulse of 150.  New onset bilateral lower extremity edema.  Certainly this could be cardiac, but other etiologies are possible.  This represents a life-threatening risk.   Pulmonary embolism is also certainly in the differential with a new onset pulse of 150.  Particular in the setting of some lower extremity edema.  Additionally, also discussed this case with one of my partners.  At this time, 911 has been called and EMS is in transit.  There are no discontinued medications. Orders Placed This Encounter  Procedures  . EKG 12-Lead    Follow-up: No follow-ups on file.  Signed,  Maud Deed. Meilyn Heindl, MD   Outpatient Encounter Medications as of 04/12/2020  Medication Sig  . Cholecalciferol (VITAMIN D3) 1000 units CAPS Take 1 capsule by mouth daily.  . Glucosamine HCl (GLUCOSAMINE PO) Take 2 capsules by mouth in the morning.  Marland Kitchen ibuprofen (ADVIL,MOTRIN) 200 MG tablet Take 200 mg by mouth daily as needed.   . Multiple Vitamin (MULTIVITAMIN) tablet Take 1 tablet by mouth daily.  . Omega 3 1000 MG CAPS Take by mouth.  Marland Kitchen omeprazole (PRILOSEC) 20 MG capsule TAKE ONE CAPSULE BY MOUTH DAILY AS NEEDED FOR HEARTBURN OR INDIGESTION.  Marland Kitchen Zinc 50 MG TABS Take by mouth.   No facility-administered encounter medications on file as of 04/12/2020.

## 2020-04-12 NOTE — ED Provider Notes (Signed)
[facility] EMERGENCY DEPARTMENT Provider Note   CSN: [id number] Arrival date & time: 04/12/20  1131     History Chief Complaint  Patient presents with  . Tachycardia    Veronica Stanley is a 74 y.o. female.  HPI Patient reports she was at the beach last week.  She began developing swelling in the left ankle.  She reports it was uncomfortable and she schedule an appointment to see her PCP.  She was concern for possibly DVT.  She has no history of DVT.  She was not experiencing any chest pain or shortness of breath.  She reports she is feeling well.  No fevers, no chills, no cough.  Once at the PCP office it was noted that she had significant tachycardia.  She reports she was not aware of this.  She has not been experiencing lightheadedness, shortness of breath or chest pain.    Past Medical History:  Diagnosis Date  . Basal cell carcinoma, arm 11/13   on arm and face   . Diverticulitis    colon  . GERD (gastroesophageal reflux disease)   . Hypertension    patient denies not on meds   . Menopausal symptoms   . Osteoarthrosis involving, or with mention of more than one site, but not specified as generalized, multiple sites   . Osteoporosis   . Peripheral vascular disease [facility])     Patient Active Problem List   Diagnosis Date Noted  . Obstructive sleep apnea 04/17/2018  . Sleep disorder 03/13/2018  . Morbid obesity ([place]) 11/28/2017  . 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 Unilateral primary osteoarthritis, left hip 09/17/2017  . Osteoarthritis of both hips 11/10/2015  . Advance directive discussed with patient 11/03/2014  . Routine general medical examination at a health care facility 03/03/2011  . Osteoarthritis, multiple sites   . GERD 10/11/2007  . ACTINIC KERATOSIS 04/17/2007  . Essential hypertension, benign 03/28/2007  . DIVERTICULOSIS, COLON 03/28/2007  . OSTEOPOROSIS 03/28/2007    Past Surgical History:  Procedure Laterality  Date  . ABDOMINAL HYSTERECTOMY    . BREAST BIOPSY Left 2003   Left breast calcifications benign  . COLONOSCOPY WITH PROPOFOL N/A 04/22/2018   Procedure: COLONOSCOPY WITH PROPOFOL;  Surgeon: Manya Silvas, MD;  Location: [facility] ENDOSCOPY;  Service: Endoscopy;  Laterality: N/A;  . DILATION AND CURETTAGE OF UTERUS     rupture- peritonitis & hysterectomy 12/79  . TOTAL HIP ARTHROPLASTY Left 10/19/2017   Procedure: LEFT TOTAL HIP ARTHROPLASTY ANTERIOR APPROACH;  Surgeon: Mcarthur Rossetti, MD;  Location: [facility] ORS;  Service: Orthopedics;  Laterality: Left;  Marland Kitchen VAGINAL DELIVERY     x2     OB History   No obstetric history on file.     Family History  Problem Relation Age of Onset  . Osteoporosis Mother   . Coronary artery disease Father   . Breast cancer Sister 64       twice  . Cancer Maternal Grandfather        colon cancer  . Breast cancer Other        first maternal cousin ?  . Diabetes Neg Hx   . Hypertension Neg Hx     Social History   Tobacco Use  . Smoking status: Never Smoker  . Smokeless tobacco: Never Used  Vaping Use  . Vaping Use: Never used  Substance Use Topics  . Alcohol use: Yes    Alcohol/week: 3.0 standard drinks  Types: 3 Glasses of wine per week  . Drug use: Never    Home Medications Prior to Admission medications   Medication Sig Start Date End Date Taking? Authorizing Provider  Cholecalciferol (VITAMIN D3) 1000 units CAPS Take 1,000 Units by mouth daily.    Yes [provider]  Glucosamine HCl (GLUCOSAMINE PO) Take 2 capsules by mouth in the morning.   Yes [provider]  ibuprofen (ADVIL,MOTRIN) 200 MG tablet Take 400 mg by mouth daily as needed for headache or mild pain.    Yes [provider]  Multiple Vitamin (MULTIVITAMIN) tablet Take 1 tablet by mouth daily.   Yes [provider]  Omega 3 1000 MG CAPS Take 1,000 mg by mouth daily.    Yes [provider]  omeprazole (PRILOSEC) 20 MG capsule  TAKE ONE CAPSULE BY MOUTH DAILY AS NEEDED FOR HEARTBURN OR INDIGESTION. Patient taking differently: Take 20 mg by mouth daily as needed (for heartburn).  09/16/18  Yes Venia Carbon, MD  Zinc 50 MG TABS Take 50 mg by mouth daily.    Yes [provider]  apixaban (ELIQUIS) 5 MG TABS tablet Take 1 tablet (5 mg total) by mouth 2 (two) times daily. 04/12/20   Charlesetta Shanks, MD  diltiazem (CARDIZEM) 60 MG tablet Take 1 tablet (60 mg total) by mouth 2 (two) times daily. 04/12/20   Charlesetta Shanks, MD  metoprolol succinate (TOPROL-XL) 25 MG 24 hr tablet Take 1 tablet (25 mg total) by mouth daily. 04/12/20   Charlesetta Shanks, MD    Allergies    Penicillins and Ibandronate sodium  Review of Systems   Review of Systems 10 systems reviewed and negative except as per HPI Physical Exam Updated Vital Signs BP (!) 146/84   Pulse 78   Temp 97.8 F (36.6 C) (Oral)   Resp 18   Ht 5' 6.5" (1.689 m)   Wt 110 kg   LMP  (LMP Unknown)   SpO2 100%   BMI 38.56 kg/m   Physical Exam Constitutional:      Appearance: She is well-developed.  HENT:     Head: Normocephalic and atraumatic.  Eyes:     Extraocular Movements: Extraocular movements intact.  Cardiovascular:     Rate and Rhythm: Tachycardia present. Rhythm irregular.  Pulmonary:     Effort: Pulmonary effort is normal.     Breath sounds: Normal breath sounds.  Abdominal:     General: Bowel sounds are normal. There is no distension.     Palpations: Abdomen is soft.     Tenderness: There is no abdominal tenderness.  Musculoskeletal:        General: Normal range of motion.     Cervical back: Neck supple.  Skin:    General: Skin is warm and dry.  Neurological:     Mental Status: She is alert and oriented to person, place, and time.     GCS: GCS eye subscore is 4. GCS verbal subscore is 5. GCS motor subscore is 6.     Coordination: Coordination normal.  Psychiatric:        Mood and Affect: Mood normal.     ED Results /  Procedures / Treatments   Labs (all labs ordered are listed, but only abnormal results are displayed) Labs Reviewed  BASIC METABOLIC PANEL - Abnormal; Notable for the following components:      Result Value   Glucose, Bld 103 (*)    All other components within normal limits  URINALYSIS, ROUTINE  W REFLEX MICROSCOPIC - Abnormal; Notable for the following components:   Color, Urine STRAW (*)    Hgb urine dipstick SMALL (*)    Bacteria, UA RARE (*)    All other components within normal limits  RESPIRATORY PANEL BY RT PCR (FLU A&B, COVID)  CBC  TSH  MAGNESIUM  PHOSPHORUS  TROPONIN I (HIGH SENSITIVITY)  TROPONIN I (HIGH SENSITIVITY)    EKG EKG Interpretation  Date/Time:  Monday April 12 2020 12:30:15 EDT Ventricular Rate:  84 PR Interval:    QRS Duration: 91 QT Interval:  385 QTC Calculation: 456 R Axis:   -25 Text Interpretation: Sinus rhythm Borderline left axis deviation no ischemic change Confirmed by Charlesetta Shanks [phone]) on 04/12/2020 3:14:24 PM   Radiology DG Chest 2 View  Result Date: 04/12/2020 CLINICAL DATA:  Shortness of breath. EXAM: CHEST - 2 VIEW COMPARISON:  None. FINDINGS: The heart size and mediastinal contours are within normal limits. No focal airspace consolidation, pleural effusion, or pneumothorax. Degenerative changes within the thoracic spine. IMPRESSION: No active cardiopulmonary disease. Electronically Signed   By: Davina Poke D.O.   On: 04/12/2020 12:17   VAS [name] LOWER EXTREMITY VENOUS (DVT) (ONLY [facility] & [facility])  Result Date: 04/12/2020  Lower Venous DVTStudy Indications: Edema, and Swelling.  Comparison Study: no prior Performing Technologist: Abram Sander RVS  Examination Guidelines: A complete evaluation includes B-mode imaging, spectral Doppler, color Doppler, and power Doppler as needed of all accessible portions of each vessel. Bilateral testing is considered an integral part of a complete examination. Limited examinations for reoccurring  indications may be performed as noted. The reflux portion of the exam is performed with the patient in reverse Trendelenburg.  +-----+---------------+---------+-----------+----------+--------------+ RIGHTCompressibilityPhasicitySpontaneityPropertiesThrombus Aging +-----+---------------+---------+-----------+----------+--------------+ CFV  Full           Yes      Yes                                 +-----+---------------+---------+-----------+----------+--------------+   +---------+---------------+---------+-----------+----------+-------------------+ LEFT     CompressibilityPhasicitySpontaneityPropertiesThrombus Aging      +---------+---------------+---------+-----------+----------+-------------------+ CFV      Full           Yes      Yes                                      +---------+---------------+---------+-----------+----------+-------------------+ SFJ      Full                                                             +---------+---------------+---------+-----------+----------+-------------------+ FV Prox  Full                                                             +---------+---------------+---------+-----------+----------+-------------------+ FV Mid   Full                                                             +---------+---------------+---------+-----------+----------+-------------------+  FV DistalFull                                                             +---------+---------------+---------+-----------+----------+-------------------+ PFV      Full                                                             +---------+---------------+---------+-----------+----------+-------------------+ POP      Full           Yes      Yes                                      +---------+---------------+---------+-----------+----------+-------------------+ PTV      Full                                                              +---------+---------------+---------+-----------+----------+-------------------+ PERO                                                  not visualized well +---------+---------------+---------+-----------+----------+-------------------+     Summary: RIGHT: - No evidence of common femoral vein obstruction.  LEFT: - There is no evidence of deep vein thrombosis in the lower extremity.  - No cystic structure found in the popliteal fossa.  *See table(s) above for measurements and observations.    Preliminary     Procedures Procedures (including critical care time)  Medications Ordered in ED Medications  metoprolol succinate (TOPROL-XL) 24 hr tablet 25 mg (has no administration in time range)  apixaban (ELIQUIS) tablet 5 mg (has no administration in time range)  diltiazem (CARDIZEM) tablet 60 mg (has no administration in time range)    ED Course  I have reviewed the triage vital signs and the nursing notes.  Pertinent labs & imaging results that were available during my care of the patient were reviewed by me and considered in my medical decision making (see chart for details).    MDM Rules/Calculators/A&P                          Consult: Dr. Doylene Canard has seen the patient in the emergency department.  Recommends Eliquis 5 mg twice daily.  Cardizem 60 mg twice daily.  Metoprolol XL 25 mg daily.  He will see the patient for recheck in the office in a week.  Patient is alert and well in appearance.  On arrival heart rates ranged from 130s to 150s.  EKG consistent with atrial fibrillation rapid ventricular response.  Patient was not significantly symptomatic.  She did not have dyspnea, hypotension, chest pain or lightheadedness.  Plan was for a bolus dose of Cardizem.  Patient got up  to go to the restroom and spontaneously converted prior to administration of medications.  At this time we will proceed with plan as per Dr. Doylene Canard.  Patient's presenting concern was for possible DVT given mild  ankle swelling.  Ultrasound obtained and negative.  Findings on lower extremity exam are quite mild.  At this time do not suspect acute cellulitis.  There is no significant amount of warmth and ankle does not have an effusion.  Stable at this time for conservative management with icing and elevation. Final Clinical Impression(s) / ED Diagnoses Final diagnoses:  Atrial fibrillation with rapid ventricular response (HCC)  Pain and swelling of left lower leg    Rx / DC Orders ED Discharge Orders         Ordered    apixaban (ELIQUIS) 5 MG TABS tablet  2 times daily        04/12/20 1626    diltiazem (CARDIZEM) 60 MG tablet  2 times daily        04/12/20 1626    metoprolol succinate (TOPROL-XL) 25 MG 24 hr tablet  Daily        04/12/20 1626           Charlesetta Shanks, MD 04/12/20 1644

## 2020-04-12 NOTE — ED Notes (Signed)
Patient verbalizes understanding of discharge instructions. Opportunity for questioning and answers were provided. Armband removed by staff, pt discharged from ED ambulatory to home.  

## 2020-04-20 ENCOUNTER — Telehealth: Payer: Self-pay

## 2020-04-20 DIAGNOSIS — E6609 Other obesity due to excess calories: Secondary | ICD-10-CM | POA: Diagnosis not present

## 2020-04-20 DIAGNOSIS — I480 Paroxysmal atrial fibrillation: Secondary | ICD-10-CM | POA: Diagnosis not present

## 2020-04-20 DIAGNOSIS — I10 Essential (primary) hypertension: Secondary | ICD-10-CM | POA: Diagnosis not present

## 2020-04-20 DIAGNOSIS — K219 Gastro-esophageal reflux disease without esophagitis: Secondary | ICD-10-CM | POA: Diagnosis not present

## 2020-04-20 DIAGNOSIS — I4519 Other right bundle-branch block: Secondary | ICD-10-CM | POA: Diagnosis not present

## 2020-04-20 NOTE — Telephone Encounter (Signed)
Submitted VOB, Monovisc, bilateral knee. 

## 2020-04-23 ENCOUNTER — Telehealth: Payer: Self-pay

## 2020-04-23 NOTE — Telephone Encounter (Signed)
Per note from Dr Silvio Pate 04-13-20:  New diagnosis of atrial fibrillation  I should probably see her in the next 1-2 weeks in addition to the cardiologist   Unfortunately, I have just now been able to call her. I left a message asking her to call and schedule an appt with him to f/u new onset A. Fib

## 2020-04-27 ENCOUNTER — Encounter: Payer: Self-pay | Admitting: Internal Medicine

## 2020-04-27 ENCOUNTER — Ambulatory Visit (INDEPENDENT_AMBULATORY_CARE_PROVIDER_SITE_OTHER): Payer: Medicare PPO | Admitting: Internal Medicine

## 2020-04-27 ENCOUNTER — Other Ambulatory Visit: Payer: Self-pay

## 2020-04-27 VITALS — BP 144/70 | HR 64 | Ht 66.5 in | Wt 242.0 lb

## 2020-04-27 DIAGNOSIS — I10 Essential (primary) hypertension: Secondary | ICD-10-CM

## 2020-04-27 DIAGNOSIS — M8949 Other hypertrophic osteoarthropathy, multiple sites: Secondary | ICD-10-CM

## 2020-04-27 DIAGNOSIS — I480 Paroxysmal atrial fibrillation: Secondary | ICD-10-CM | POA: Diagnosis not present

## 2020-04-27 DIAGNOSIS — M159 Polyosteoarthritis, unspecified: Secondary | ICD-10-CM

## 2020-04-27 NOTE — Progress Notes (Signed)
Subjective:    Patient ID: Veronica Stanley, female    DOB: 1945-10-20, 74 y.o.   MRN: [id number]  HPI Here for follow up of new onset atrial fibrillation This visit occurred during the SARS-CoV-2 public health emergency.  Safety protocols were in place, including screening questions prior to the visit, additional usage of staff PPE, and extensive cleaning of exam room while observing appropriate contact time as indicated for disinfecting solutions.   Was at the beach Noticed ankle swelling and tightness Did have a lot of salt while there Could tell "something wasn't right" No chest pain or palpitations Some DOE while walking in the sand--didn't seem unusual  Came home--got appt here Was tachycardic and sent to ER New onset atrial fibrillation--started on eliquis, metoprolol and diltiazem Did follow up with Dr Rockney Ghee his office "was like another world" Will now follow up with Dr Rockey Situ  Had steroid injection in knees Now planning gel injections  Current Outpatient Medications on File Prior to Visit  Medication Sig Dispense Refill  . apixaban (ELIQUIS) 5 MG TABS tablet Take 1 tablet (5 mg total) by mouth 2 (two) times daily. 60 tablet 1  . Cholecalciferol (VITAMIN D3) 1000 units CAPS Take 1,000 Units by mouth daily.     . COLLAGEN PO Take by mouth.    . diltiazem (CARDIZEM) 60 MG tablet Take 1 tablet (60 mg total) by mouth 2 (two) times daily. 60 tablet 1  . Glucosamine HCl (GLUCOSAMINE PO) Take 2 capsules by mouth in the morning.    Marland Kitchen ibuprofen (ADVIL,MOTRIN) 200 MG tablet Take 400 mg by mouth daily as needed for headache or mild pain.     . metoprolol succinate (TOPROL-XL) 25 MG 24 hr tablet Take 1 tablet (25 mg total) by mouth daily. 30 tablet 1  . Multiple Vitamin (MULTIVITAMIN) tablet Take 1 tablet by mouth daily.    . Omega 3 1000 MG CAPS Take 1,000 mg by mouth daily.     Marland Kitchen omeprazole (PRILOSEC) 20 MG capsule TAKE ONE CAPSULE BY MOUTH DAILY AS NEEDED FOR HEARTBURN OR  INDIGESTION. (Patient taking differently: Take 20 mg by mouth daily as needed (for heartburn). ) 30 capsule 2  . Zinc 50 MG TABS Take 50 mg by mouth daily.      No current facility-administered medications on file prior to visit.    Allergies  Allergen Reactions  . Penicillins Anaphylaxis, Shortness Of Breath, Swelling and Rash    Has patient had a PCN reaction causing immediate rash, facial/tongue/throat swelling, SOB or lightheadedness with hypotension: Yes Has patient had a PCN reaction causing severe rash involving mucus membranes or skin necrosis: Yes Has patient had a PCN reaction that required hospitalization: No Has patient had a PCN reaction occurring within the last 10 years: No If all of the above answers are "NO", then may proceed with Cephalosporin use.   . Ibandronate Sodium Other (See Comments)    aches    Past Medical History:  Diagnosis Date  . Basal cell carcinoma, arm 11/13   on arm and face   . Diverticulitis    colon  . GERD (gastroesophageal reflux disease)   . Hypertension    patient denies not on meds   . Menopausal symptoms   . Osteoarthrosis involving, or with mention of more than one site, but not specified as generalized, multiple sites   . Osteoporosis   . Peripheral vascular disease [facility])     Past Surgical History:  Procedure Laterality Date  .  ABDOMINAL HYSTERECTOMY    . BREAST BIOPSY Left 2003   Left breast calcifications benign  . COLONOSCOPY WITH PROPOFOL N/A 04/22/2018   Procedure: COLONOSCOPY WITH PROPOFOL;  Surgeon: Manya Silvas, MD;  Location: [facility] ENDOSCOPY;  Service: Endoscopy;  Laterality: N/A;  . DILATION AND CURETTAGE OF UTERUS     rupture- peritonitis & hysterectomy 12/79  . TOTAL HIP ARTHROPLASTY Left 10/19/2017   Procedure: LEFT TOTAL HIP ARTHROPLASTY ANTERIOR APPROACH;  Surgeon: Mcarthur Rossetti, MD;  Location: [facility] ORS;  Service: Orthopedics;  Laterality: Left;  Marland Kitchen VAGINAL DELIVERY     x2    Family History   Problem Relation Age of Onset  . Osteoporosis Mother   . Coronary artery disease Father   . Breast cancer Sister 2       twice  . Cancer Maternal Grandfather        colon cancer  . Breast cancer Other        first maternal cousin ?  . Diabetes Neg Hx   . Hypertension Neg Hx     Social History   Socioeconomic History  . Marital status: Divorced    Spouse name: Not on file  . Number of children: 2  . Years of education: Not on file  . Highest education level: Not on file  Occupational History  . 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 Occupation:    Tobacco Use  . Smoking status: Never Smoker  . Smokeless tobacco: Never Used  Vaping Use  . Vaping Use: Never used  Substance and Sexual Activity  . Alcohol use: Yes    Alcohol/week: 3.0 standard drinks    Types: 3 Glasses of wine per week  . Drug use: Never  . Sexual activity: Not on file  Other Topics Concern  . Not on file  Social History Narrative   No living will   Would want daughter, Margarita Grizzle,  Then son Ginnie Smart to make health care decisions for her   Would accept resuscitation attempts   Not sure about tube feedings   Social Determinants of Health   Financial Resource Strain:   . Difficulty of Paying Living Expenses: Not on file  Food Insecurity:   . Worried About Charity fundraiser in the Last Year: Not on file  . Ran Out of Food in the Last Year: Not on file  Transportation Needs:   . Lack of Transportation (Medical): Not on file  . Lack of Transportation (Non-Medical): Not on file  Physical Activity:   . Days of Exercise per Week: Not on file  . Minutes of Exercise per Session: Not on file  Stress:   . Feeling of Stress : Not on file  Social Connections:   . Frequency of Communication with Friends and Family: Not on file  . Frequency of Social Gatherings with Friends and Family: Not on file  . Attends Religious Services: Not on file  . Active Member of Clubs or  Organizations: Not on file  . Attends Archivist Meetings: Not on file  . Marital Status: Not on file  Intimate Partner Violence:   . Fear of Current or Ex-Partner: Not on file  . Emotionally Abused: Not on file  . Physically Abused: Not on file  . Sexually Abused: Not on file   Review of Systems  Feels better No DVT when tested Does note some "aching" and decrease in energy Appetite is fine--trying to do the right things Sleeps fine--with  CPAP     Objective:   Physical Exam Constitutional:      Appearance: Normal appearance.  Cardiovascular:     Rate and Rhythm: Normal rate and regular rhythm.     Pulses: Normal pulses.     Heart sounds: No murmur heard.  No gallop.   Pulmonary:     Effort: Pulmonary effort is normal.     Breath sounds: Normal breath sounds. No wheezing or rales.  Musculoskeletal:     Cervical back: Neck supple.     Right lower leg: No edema.     Left lower leg: No edema.     Comments: No calf tenderness  Lymphadenopathy:     Cervical: No cervical adenopathy.  Neurological:     Mental Status: She is alert.  Psychiatric:        Mood and Affect: Mood normal.        Behavior: Behavior normal.            Assessment & Plan:

## 2020-04-27 NOTE — Assessment & Plan Note (Signed)
Due for gel injections in both knees

## 2020-04-27 NOTE — Assessment & Plan Note (Signed)
BP Readings from Last 3 Encounters:  04/27/20 (!) 144/70  04/12/20 (!) 143/90  04/12/20 130/90   Is on both diltiazem and metoprolol now----usually BP is low Not sure she needs both

## 2020-04-27 NOTE — Assessment & Plan Note (Addendum)
Had new onset of atrial fibrillation with rapid rate---only symptom was some edema Did convert in the ER before she got any medications Now continues in regular rhythm Mostly feels good--but may have slight fatigue (from meds?) Will check echo Seeing Dr Rockey Situ later this month---seems appropriate to decide on one or the other of the medications Is on the eliquis

## 2020-04-30 ENCOUNTER — Telehealth: Payer: Self-pay

## 2020-04-30 NOTE — Telephone Encounter (Signed)
Attached office notes to [facility] online.

## 2020-04-30 NOTE — Telephone Encounter (Signed)
Please attach notes

## 2020-04-30 NOTE — Telephone Encounter (Signed)
Submitted PA with [facility] for Gel injections. Waiting on reponse

## 2020-05-04 ENCOUNTER — Telehealth: Payer: Self-pay

## 2020-05-04 NOTE — Telephone Encounter (Signed)
Approved for Monovisc-Bilateral knee  Dr. Margarito Liner and Bill $40 copay No OOP Prior auth required with [facility] [id number] Dates: 04/30/20-10/28/20

## 2020-05-04 NOTE — Telephone Encounter (Signed)
Tried calling pt to schedule. Phone just kept ringing. Will try again later

## 2020-05-04 NOTE — Telephone Encounter (Signed)
Ok to schedule - next available.

## 2020-05-05 NOTE — Telephone Encounter (Signed)
Called and scheduled. Pt is to call back to officially confirm appointment after seeing heart MD

## 2020-05-09 NOTE — Progress Notes (Signed)
Cardiology Office Note  Date:  05/10/2020   ID:  [name], DOB [birth year], MRN [id number]  PCP:  Venia Carbon, MD   Chief Complaint  Patient presents with  . New Patient (Initial Visit)    Self-referral  Pt c/o left leg swelling accompanied by burning/tingling feeling--states she has those feelings in both legs (left worse than right).     HPI:  Veronica Stanley is a 74 year old woman with past medical history of Obstructive sleep apnea, on CPAP Paroxysmal atrial fibrillation Hypertension Obesity Nonsmoker, no diabetes Who presents by referral from Dr. Silvio Pate for consultation of her paroxysmal atrial fibrillation  Note indicating she was at the beach (lots of salt and et[place]) when she noticed ankle swelling and tightness Some shortness of breath when walking on the sand, felt different Noted to be tachycardic in the clinic and was sent to the emergency room, noted to have new onset atrial fibrillation Started on Eliquis, metoprolol, diltiazem Note indicating she converted to normal sinus rhythm in the ER  Recent steroid injections to her knees, plan for gel injections  Echo pending Prior echo 2017 normal ejection fraction at that time, left atrium normal size  Back on CPAP for OSA  Does exercise 3x a week Still with leg swelling  EKG personally reviewed by myself on todays visit  shows NSR with rate 72 no ST ro T wave changes  PMH:   has a past medical history of Basal cell carcinoma, arm (11/13), Diverticulitis, GERD (gastroesophageal reflux disease), Hypertension, Menopausal symptoms, Osteoarthrosis involving, or with mention of more than one site, but not specified as generalized, multiple sites, Osteoporosis, and Peripheral vascular disease ([place]).  PSH:    Past Surgical History:  Procedure Laterality Date  . ABDOMINAL HYSTERECTOMY    . BREAST BIOPSY Left 2003   Left breast calcifications benign  . COLONOSCOPY WITH PROPOFOL N/A 04/22/2018   Procedure:  COLONOSCOPY WITH PROPOFOL;  Surgeon: Manya Silvas, MD;  Location: [facility] ENDOSCOPY;  Service: Endoscopy;  Laterality: N/A;  . DILATION AND CURETTAGE OF UTERUS     rupture- peritonitis & hysterectomy 12/79  . TOTAL HIP ARTHROPLASTY Left 10/19/2017   Procedure: LEFT TOTAL HIP ARTHROPLASTY ANTERIOR APPROACH;  Surgeon: Mcarthur Rossetti, MD;  Location: [facility] ORS;  Service: Orthopedics;  Laterality: Left;  Marland Kitchen VAGINAL DELIVERY     x2    Current Outpatient Medications  Medication Sig Dispense Refill  . apixaban (ELIQUIS) 5 MG TABS tablet Take 1 tablet (5 mg total) by mouth 2 (two) times daily. 60 tablet 1  . Cholecalciferol (VITAMIN D3) 1000 units CAPS Take 1,000 Units by mouth daily.     . COLLAGEN PO Take by mouth.    . diltiazem (CARDIZEM) 60 MG tablet Take 1 tablet (60 mg total) by mouth 2 (two) times daily. 60 tablet 1  . Glucosamine HCl (GLUCOSAMINE PO) Take 2 capsules by mouth in the morning.    Marland Kitchen ibuprofen (ADVIL,MOTRIN) 200 MG tablet Take 400 mg by mouth daily as needed for headache or mild pain.     . metoprolol succinate (TOPROL-XL) 25 MG 24 hr tablet Take 1 tablet (25 mg total) by mouth daily. 30 tablet 1  . Multiple Vitamin (MULTIVITAMIN) tablet Take 1 tablet by mouth daily.    . Omega 3 1000 MG CAPS Take 1,000 mg by mouth daily.     Marland Kitchen omeprazole (PRILOSEC) 20 MG capsule TAKE ONE CAPSULE BY MOUTH DAILY AS NEEDED FOR HEARTBURN OR INDIGESTION. (Patient taking differently: Take  20 mg by mouth daily as needed (for heartburn). ) 30 capsule 2  . Zinc 50 MG TABS Take 50 mg by mouth daily.      No current facility-administered medications for this visit.     Allergies:   Penicillins and Ibandronate sodium   Social History:  The patient  reports that she has never smoked. She has never used smokeless tobacco. She reports current alcohol use of about 3.0 standard drinks of alcohol per week. She reports that she does not use drugs.   Family History:   family history includes Breast  cancer in an other family member; Breast cancer (age of onset: 51) in her sister; Cancer in her maternal grandfather; Coronary artery disease in her father; Osteoporosis in her mother.    Review of Systems: Review of Systems  Constitutional: Negative.   HENT: Negative.   Respiratory: Negative.   Cardiovascular: Negative.   Gastrointestinal: Negative.   Musculoskeletal: Negative.   Neurological: Negative.   Psychiatric/Behavioral: Negative.   All other systems reviewed and are negative.   PHYSICAL EXAM: VS:  BP 132/80   Pulse 72   Ht 5' 6.5" (1.689 m)   Wt 243 lb (110.2 kg)   LMP  (LMP Unknown)   BMI 38.63 kg/m  , BMI Body mass index is 38.63 kg/m. GEN: Well nourished, well developed, in no acute distress, obese HEENT: normal Neck: no JVD, carotid bruits, or masses Cardiac: RRR; no murmurs, rubs, or gallops, trace bilateral lower extremity edema Respiratory:  clear to auscultation bilaterally, normal work of breathing GI: soft, nontender, nondistended, + BS MS: no deformity or atrophy Skin: warm and dry, no rash Neuro:  Strength and sensation are intact Psych: euthymic mood, full affect   Recent Labs: 02/10/2020: ALT 9 04/12/2020: BUN 18; Creatinine, Ser 0.77; Hemoglobin 14.2; Magnesium 2.4; Platelets 279; Potassium 4.5; Sodium 140; TSH 3.454    Lipid Panel Lab Results  Component Value Date   CHOL 179 03/13/2018   HDL 49.30 03/13/2018   LDLCALC 102 (H) 03/13/2018   TRIG 138.0 03/13/2018      Wt Readings from Last 3 Encounters:  05/10/20 243 lb (110.2 kg)  04/27/20 242 lb (109.8 kg)  04/12/20 242 lb 8.1 oz (110 kg)      ASSESSMENT AND PLAN:  Problem List Items Addressed This Visit    Morbid obesity ([place])    Other Visit Diagnoses    PAF (paroxysmal atrial fibrillation) (HCC)    -  Primary   Leg edema         Atrial fib, paroxysmal NSR today, converted in the emergency room  Leg swelling possibly exacerbated from diltiazem, possible fluid  retention Recommend she take Lasix as needed We will hold the diltiazem, increase metoprolol succinate up to 100 daily  Leg swelling Stop diltiazem as above Lasix as needed Echocardiogram pending to estimate right heart pressures  Family hx of CAD, MI Discussed CT coronary calcium scoring, she is interested Non-smoker, no diabetes  Morbid obesity Lifestyle modification recommended, portion control, intermittent fasting Walking program with no restrictions   Total encounter time more than 60 minutes  Greater than 50% was spent in counseling and coordination of care with the patient    Signed, Esmond Plants, M.D., Ph.D. [place], [place]

## 2020-05-10 ENCOUNTER — Encounter: Payer: Self-pay | Admitting: Cardiovascular Disease

## 2020-05-10 ENCOUNTER — Other Ambulatory Visit: Payer: Self-pay

## 2020-05-10 ENCOUNTER — Ambulatory Visit: Payer: Medicare PPO | Admitting: Cardiovascular Disease

## 2020-05-10 VITALS — BP 132/80 | HR 72 | Ht 66.5 in | Wt 243.0 lb

## 2020-05-10 DIAGNOSIS — I480 Paroxysmal atrial fibrillation: Secondary | ICD-10-CM

## 2020-05-10 DIAGNOSIS — Z8249 Family history of ischemic heart disease and other diseases of the circulatory system: Secondary | ICD-10-CM

## 2020-05-10 DIAGNOSIS — R600 Localized edema: Secondary | ICD-10-CM | POA: Diagnosis not present

## 2020-05-10 MED ORDER — DILTIAZEM HCL 60 MG PO TABS: 60.0000 mg | ORAL_TABLET | Freq: Every day | ORAL | 1 refills | Status: DC | PRN

## 2020-05-10 MED ORDER — METOPROLOL SUCCINATE ER 100 MG PO TB24: 100.0000 mg | ORAL_TABLET | Freq: Every day | ORAL | 1 refills | Status: DC

## 2020-05-10 MED ORDER — FUROSEMIDE 20 MG PO TABS: 20.0000 mg | ORAL_TABLET | Freq: Every day | ORAL | 3 refills | Status: DC | PRN

## 2020-05-10 NOTE — Patient Instructions (Addendum)
Medication Instructions:  Hold the diltiazem (last dose tonight), only take when needed for heart great great than 100 beat per minute (please check manually before taking medication) per Dr. Rockey Situ "Monitor heart rate, for elevated rate, take diltiazem x1, this is likely atrial fibrillation"  Metoprolol was increase up to 100mg  daily by mouth  Lasix/furosemide 20mg  daily by mouth as needed, please take sparingly as requested by Dr. Rockey Situ as reviewed   If you need a refill on your cardiac medications before your next appointment, please call your pharmacy.    Lab work: No new labs needed   If you have labs (blood work) drawn today and your tests are completely normal, you will receive your results only by: Marland Kitchen MyChart Message (if you have MyChart) OR . A paper copy in the mail If you have any lab test that is abnormal or we need to change your treatment, we will call you to review the results.   Testing/Procedures: CT coronary calcium score for family fx has been order  We will order CT coronary calcium score $150 out of pocket expense  at our [facility] in [place]  This procedure uses special x-ray equipment to produce pictures of the coronary arteries to determine if they are blocked or narrowed by the buildup of plaque - an indicator for atherosclerosis or coronary artery disease (CAD).  Please call [phone] to schedule at your earliest convince   [place], [place] [address]      Follow-Up: At [facility], you and your health needs are our priority.  As part of our continuing mission to provide you with exceptional heart care, we have created designated Provider Care Teams.  These Care Teams include your primary Cardiologist (physician) and Advanced Practice Providers (APPs -  Physician Assistants and Nurse Practitioners) who all work together to provide you with the care you need, when you need  it.  . You will need a follow up appointment in 3 months  . Providers on your designated Care Team:   . Murray Hodgkins, NP . Christell Faith, PA-C . Marrianne Mood, PA-C  Any Other Special Instructions Will Be Listed Below (If Applicable).  COVID-19 Vaccine Information can be found at: ShippingScam.co.uk For questions related to vaccine distribution or appointments, please email [email or web address] or call [phone].

## 2020-05-17 ENCOUNTER — Encounter: Payer: Self-pay | Admitting: Physician Assistant

## 2020-05-17 ENCOUNTER — Ambulatory Visit (INDEPENDENT_AMBULATORY_CARE_PROVIDER_SITE_OTHER): Payer: Medicare PPO | Admitting: Physician Assistant

## 2020-05-17 DIAGNOSIS — M1712 Unilateral primary osteoarthritis, left knee: Secondary | ICD-10-CM | POA: Diagnosis not present

## 2020-05-17 DIAGNOSIS — M1711 Unilateral primary osteoarthritis, right knee: Secondary | ICD-10-CM | POA: Diagnosis not present

## 2020-05-17 DIAGNOSIS — M170 Bilateral primary osteoarthritis of knee: Secondary | ICD-10-CM | POA: Diagnosis not present

## 2020-05-17 MED ORDER — LIDOCAINE HCL 1 % IJ SOLN
0.5000 mL | INTRAMUSCULAR | Status: AC | PRN
  Administered 2020-05-17: .5 mL

## 2020-05-17 NOTE — Progress Notes (Signed)
   Procedure Note  Patient: Veronica Stanley             Date of Birth: [birth year]           MRN: [id number]             Visit Date: 05/17/2020 HPI: Veronica Stanley comes in today for scheduled bilateral Monovisc injections.  She is having pain in both knees.  She has been  having pain in particularly in the right knee posterior aspect with some swelling.  Procedures: Visit Diagnoses:  1. Primary osteoarthritis of right knee   2. Primary osteoarthritis of left knee     Large Joint Inj: bilateral knee on 05/17/2020 10:38 AM Indications: pain Details: 22 G 1.5 in needle, anterolateral approach  Arthrogram: No  Medications (Right): 0.5 mL lidocaine 1 % Aspirate (Right): 30 mL blood-tinged and yellow Outcome: tolerated well, no immediate complications Procedure, treatment alternatives, risks and benefits explained, specific risks discussed. Consent was given by the patient. Immediately prior to procedure a time out was called to verify the correct patient, procedure, equipment, support staff and site/side marked as required. Patient was prepped and draped in the usual sterile fashion.     Plan: She understands to wait at least 6 months between supplemental injections of both knees.  She understands she can have cortisone injections as often as every 3 months if needed.  We will see her back on an as-needed basis.  Questions encouraged and answered

## 2020-05-19 ENCOUNTER — Other Ambulatory Visit: Payer: Self-pay | Admitting: Internal Medicine

## 2020-05-19 DIAGNOSIS — Z1231 Encounter for screening mammogram for malignant neoplasm of breast: Secondary | ICD-10-CM

## 2020-05-20 ENCOUNTER — Other Ambulatory Visit: Payer: Medicare PPO

## 2020-05-24 ENCOUNTER — Telehealth: Payer: Self-pay | Admitting: Cardiovascular Disease

## 2020-05-24 NOTE — Telephone Encounter (Signed)
Spoke to pt. She has not been weighing daily but states she has gained 3 lbs in the past few days.  BLE swelling. Pt did go shopping all day yesterday likely r/t swelling. Pt had SOB after incr walking yesterday. Denies SOB today.  Denies incr sodium. Pt has been limiting sodium intake. Pt did take Lasix 20mg  this morning.  Per last ov note take Lasix 20mg  PRN and use sparingly.  Advised pt to take another Lasix tomorrow and Wednesday then go back to using only sparingly as needed and to keep legs elevated as much as possible.  Also advised pt to weight daily so she can monitor if wt gain of 3lbs in one day or 5 lbs in a week for management.  Pt verbalized understanding and will call back w/ any changes in symptoms.

## 2020-05-24 NOTE — Telephone Encounter (Signed)
Pt c/o swelling: STAT is pt has developed SOB within 24 hours  1) How much weight have you gained and in what time span? yes  2) If swelling, where is the swelling located? BLE  3) Are you currently taking a fluid pill? yes  4) Are you currently SOB? Not really only on over exertion c/o tiredness easy   5) Do you have a log of your daily weights (if so, list)? No   6) Have you gained 3 pounds in a day or 5 pounds in a week? yes  7) Have you traveled recently? No

## 2020-05-25 ENCOUNTER — Other Ambulatory Visit: Payer: Self-pay

## 2020-05-25 ENCOUNTER — Ambulatory Visit (INDEPENDENT_AMBULATORY_CARE_PROVIDER_SITE_OTHER): Payer: Medicare PPO

## 2020-05-25 DIAGNOSIS — I480 Paroxysmal atrial fibrillation: Secondary | ICD-10-CM

## 2020-05-25 LAB — ECHOCARDIOGRAM COMPLETE
AR max vel: 2.86 cm2
AV Area VTI: 2.92 cm2
AV Area mean vel: 2.66 cm2
AV Mean grad: 5 mmHg
AV Peak grad: 8.3 mmHg
Ao pk vel: 1.44 m/s
Area-P 1/2: 3.48 cm2
S' Lateral: 3 cm
Single Plane A4C EF: 59.2 %

## 2020-05-28 NOTE — Addendum Note (Signed)
Addended by: Therisa Doyne on: 05/28/2020 02:36 PM   Modules accepted: Orders

## 2020-06-09 ENCOUNTER — Telehealth: Payer: Self-pay | Admitting: Cardiovascular Disease

## 2020-06-09 MED ORDER — APIXABAN 5 MG PO TABS
5.0000 mg | ORAL_TABLET | Freq: Two times a day (BID) | ORAL | 6 refills | Status: DC
Start: 2020-06-09 — End: 2021-01-10

## 2020-06-09 NOTE — Telephone Encounter (Signed)
Pt's age 74, wt 110.2 kg, SCr 0.75, CrCl 111.02, last ov w/ TG 05/10/20. Refill sent in as requested.

## 2020-06-09 NOTE — Telephone Encounter (Signed)
°*  STAT* If patient is at the pharmacy, call can be transferred to refill team.   1. Which medications need to be refilled? (please list name of each medication and dose if known) eliquis 5 mg bid  2. Which pharmacy/location (including street and city if local pharmacy) is medication to be sent to? [facility] [place]  3. Do they need a 30 day or 90 day supply? 30 with refills

## 2020-06-09 NOTE — Telephone Encounter (Signed)
Refill request

## 2020-06-23 ENCOUNTER — Other Ambulatory Visit: Payer: Self-pay

## 2020-06-23 ENCOUNTER — Ambulatory Visit
Admission: RE | Admit: 2020-06-23 | Discharge: 2020-06-23 | Disposition: A | Discharge status: Home / Self Care | Payer: Medicare PPO | Source: Ambulatory Visit | Attending: Cardiovascular Disease | Admitting: Cardiovascular Disease

## 2020-06-23 DIAGNOSIS — K760 Fatty (change of) liver, not elsewhere classified: Secondary | ICD-10-CM | POA: Diagnosis not present

## 2020-06-23 DIAGNOSIS — Z8249 Family history of ischemic heart disease and other diseases of the circulatory system: Secondary | ICD-10-CM | POA: Insufficient documentation

## 2020-06-23 DIAGNOSIS — I700 Atherosclerosis of aorta: Secondary | ICD-10-CM | POA: Diagnosis not present

## 2020-06-28 ENCOUNTER — Telehealth: Payer: Self-pay

## 2020-06-28 DIAGNOSIS — E785 Hyperlipidemia, unspecified: Secondary | ICD-10-CM

## 2020-06-28 NOTE — Telephone Encounter (Signed)
Able to reach pt regarding her recent CT coronary calcium score, Dr. Rockey Situ had a chance to review her results and advised   "Moderately elevated score based on her age  No further testing needed  Would consider starting low-dose statin such as Crestor 5  Lipids in 3 months, goal LDL less than 70"  Pt would like to wait on starting Crestor, thinks it may be beneficial, however pt reports she is on medications that her daughter is concern about with interactions and high dosing that she wants her daughter to review with Dr. Rockey Situ at her 2/8 appt to update her medications and to make any changes then. Lipid panel order placed for 3 months out, pt wrote on her calender to havbe drawn at [place] first week in April. Otherwise all questions or concerns were address and no additional concerns at this time. Agreeable to plan, will call back for anything further.

## 2020-07-14 DIAGNOSIS — L821 Other seborrheic keratosis: Secondary | ICD-10-CM | POA: Diagnosis not present

## 2020-07-14 DIAGNOSIS — D2262 Melanocytic nevi of left upper limb, including shoulder: Secondary | ICD-10-CM | POA: Diagnosis not present

## 2020-07-14 DIAGNOSIS — D2271 Melanocytic nevi of right lower limb, including hip: Secondary | ICD-10-CM | POA: Diagnosis not present

## 2020-07-14 DIAGNOSIS — Z85828 Personal history of other malignant neoplasm of skin: Secondary | ICD-10-CM | POA: Diagnosis not present

## 2020-07-14 DIAGNOSIS — D2272 Melanocytic nevi of left lower limb, including hip: Secondary | ICD-10-CM | POA: Diagnosis not present

## 2020-07-14 DIAGNOSIS — D2261 Melanocytic nevi of right upper limb, including shoulder: Secondary | ICD-10-CM | POA: Diagnosis not present

## 2020-07-15 ENCOUNTER — Ambulatory Visit
Admission: RE | Admit: 2020-07-15 | Discharge: 2020-07-15 | Disposition: A | Discharge status: Home / Self Care | Payer: Medicare PPO | Source: Ambulatory Visit | Attending: Internal Medicine | Admitting: Internal Medicine

## 2020-07-15 ENCOUNTER — Other Ambulatory Visit: Payer: Self-pay

## 2020-07-15 DIAGNOSIS — Z1231 Encounter for screening mammogram for malignant neoplasm of breast: Secondary | ICD-10-CM | POA: Diagnosis not present

## 2020-07-19 ENCOUNTER — Ambulatory Visit: Payer: Medicare PPO | Admitting: Cardiovascular Disease

## 2020-07-24 NOTE — Progress Notes (Unsigned)
Cardiology Office Note  Date:  07/27/2020   ID:  Veronica Stanley, DOB 05-Mar-1946, MRN [id number]  PCP:  Venia Carbon, MD   Chief Complaint  Patient presents with  . Other    3 month follow up. Meds reviewed verbally with patient.     HPI:  [name] is a 75 year-old woman with past medical history of Obstructive sleep apnea, on CPAP Paroxysmal atrial fibrillation Hypertension Obesity Nonsmoker, no diabetes Who presents for paroxysmal atrial fibrillation  In follow-up today reports having no significant atrial fibrillation episodes Tolerating metoprolol succinate 100 daily Diltiazem previously held for leg swelling Leg edema has improved Still little bit of swelling left lower extremity, seems to happen when she is on her feet after long hours  CT coronary, results discussed Coronary calcium score of 157. This was 66th percentile  BP well controlled on current medications  Continues to have problems with chronic knee pain, has had steroid injections Compliant with CPAP for sleep apnea Doing less exercise   EKG personally reviewed by myself on todays visit  shows NSR with rate 62 no ST ro T wave changes  Echo pending Prior echo 2017 normal ejection fraction at that time, left atrium normal size   PMH:   has a past medical history of Basal cell carcinoma, arm (11/13), Diverticulitis, GERD (gastroesophageal reflux disease), Hypertension, Menopausal symptoms, Osteoarthrosis involving, or with mention of more than one site, but not specified as generalized, multiple sites, Osteoporosis, and Peripheral vascular disease ([place]).  PSH:    Past Surgical History:  Procedure Laterality Date  . ABDOMINAL HYSTERECTOMY    . BREAST BIOPSY Left 2003   Left breast calcifications benign  . COLONOSCOPY WITH PROPOFOL N/A 04/22/2018   Procedure: COLONOSCOPY WITH PROPOFOL;  Surgeon: Manya Silvas, MD;  Location: [facility] ENDOSCOPY;  Service: Endoscopy;  Laterality: N/A;  . DILATION  AND CURETTAGE OF UTERUS     rupture- peritonitis & hysterectomy 12/79  . TOTAL HIP ARTHROPLASTY Left 10/19/2017   Procedure: LEFT TOTAL HIP ARTHROPLASTY ANTERIOR APPROACH;  Surgeon: Mcarthur Rossetti, MD;  Location: [facility] ORS;  Service: Orthopedics;  Laterality: Left;  Marland Kitchen VAGINAL DELIVERY     x2    Current Outpatient Medications  Medication Sig Dispense Refill  . apixaban (ELIQUIS) 5 MG TABS tablet Take 1 tablet (5 mg total) by mouth 2 (two) times daily. 60 tablet 6  . Cholecalciferol (VITAMIN D3) 1000 units CAPS Take 1,000 Units by mouth daily.     . COLLAGEN PO Take by mouth.    . diltiazem (CARDIZEM) 60 MG tablet Take 1 tablet (60 mg total) by mouth daily as needed. Take for elevated heart rate greater than 100 60 tablet 1  . furosemide (LASIX) 20 MG tablet Take 1 tablet (20 mg total) by mouth daily as needed. Take sparingly as needed per MD 30 tablet 3  . Glucosamine HCl (GLUCOSAMINE PO) Take 2 capsules by mouth in the morning.    Marland Kitchen ibuprofen (ADVIL,MOTRIN) 200 MG tablet Take 400 mg by mouth daily as needed for headache or mild pain.     . metoprolol succinate (TOPROL-XL) 100 MG 24 hr tablet Take 1 tablet (100 mg total) by mouth daily. 90 tablet 1  . Multiple Vitamin (MULTIVITAMIN) tablet Take 1 tablet by mouth daily.    . Omega 3 1000 MG CAPS Take 1,000 mg by mouth daily.     Marland Kitchen omeprazole (PRILOSEC) 20 MG capsule TAKE ONE CAPSULE BY MOUTH DAILY AS NEEDED FOR  HEARTBURN OR INDIGESTION. 30 capsule 2  . Zinc 50 MG TABS Take 50 mg by mouth daily.      No current facility-administered medications for this visit.     Allergies:   Penicillins and Ibandronate sodium   Social History:  The patient  reports that she has never smoked. She has never used smokeless tobacco. She reports current alcohol use of about 3.0 standard drinks of alcohol per week. She reports that she does not use drugs.   Family History:   family history includes Breast cancer in her cousin; Breast cancer (age of onset:  80) in her sister; Cancer in her maternal grandfather; Coronary artery disease in her father; Osteoporosis in her mother.    Review of Systems: Review of Systems  Constitutional: Negative.   HENT: Negative.   Respiratory: Negative.   Cardiovascular: Negative.   Gastrointestinal: Negative.   Musculoskeletal: Negative.   Neurological: Negative.   Psychiatric/Behavioral: Negative.   All other systems reviewed and are negative.   PHYSICAL EXAM: VS:  BP 138/72 (BP Location: Left Arm, Patient Position: Sitting, Cuff Size: Large)   Pulse 62   Ht 5' 6.5" (1.689 m)   Wt 246 lb (111.6 kg)   LMP  (LMP Unknown)   SpO2 97%   BMI 39.11 kg/m  , BMI Body mass index is 39.11 kg/m. GEN: Well nourished, well developed, in no acute distress, obese HEENT: normal Neck: no JVD, carotid bruits, or masses Cardiac: RRR; no murmurs, rubs, or gallops, trace bilateral lower extremity edema Respiratory:  clear to auscultation bilaterally, normal work of breathing GI: soft, nontender, nondistended, + BS MS: no deformity or atrophy Skin: warm and dry, no rash Neuro:  Strength and sensation are intact Psych: euthymic mood, full affect   Recent Labs: 02/10/2020: ALT 9 04/12/2020: BUN 18; Creatinine, Ser 0.77; Hemoglobin 14.2; Magnesium 2.4; Platelets 279; Potassium 4.5; Sodium 140; TSH 3.454    Lipid Panel Lab Results  Component Value Date   CHOL 179 03/13/2018   HDL 49.30 03/13/2018   LDLCALC 102 (H) 03/13/2018   TRIG 138.0 03/13/2018     Wt Readings from Last 3 Encounters:  07/27/20 246 lb (111.6 kg)  05/10/20 243 lb (110.2 kg)  04/27/20 242 lb (109.8 kg)     ASSESSMENT AND PLAN:  Problem List Items Addressed This Visit    Morbid obesity ([place])    Other Visit Diagnoses    PAF (paroxysmal atrial fibrillation) ([place])    -  Primary   Hyperlipidemia, unspecified hyperlipidemia type       Leg edema         Atrial fib, paroxysmal Maintaining normal sinus rhythm on metoprolol Calcium  channel blockers previously held for leg swelling Tolerating Eliquis  Leg swelling No significant swelling on exam today Lasix as needed Avoid calcium channel blockers Component of venous or lymphedema? Has had prior trauma to left lower extremity, left hip surgery  Family hx of CAD, MI Non-smoker, no diabetes CT coronary calcium score 157  Morbid obesity We have encouraged continued exercise, careful diet management in an effort to lose weight.   Total encounter time more than 25 minutes  Greater than 50% was spent in counseling and coordination of care with the patient    Signed, Esmond Plants, M.D., Ph.D. [place], [place]

## 2020-07-27 ENCOUNTER — Encounter: Payer: Self-pay | Admitting: Cardiovascular Disease

## 2020-07-27 ENCOUNTER — Other Ambulatory Visit: Payer: Self-pay

## 2020-07-27 ENCOUNTER — Ambulatory Visit: Payer: Medicare PPO | Admitting: Cardiovascular Disease

## 2020-07-27 VITALS — BP 138/72 | HR 62 | Ht 66.5 in | Wt 246.0 lb

## 2020-07-27 DIAGNOSIS — R600 Localized edema: Secondary | ICD-10-CM | POA: Diagnosis not present

## 2020-07-27 DIAGNOSIS — E785 Hyperlipidemia, unspecified: Secondary | ICD-10-CM

## 2020-07-27 DIAGNOSIS — I480 Paroxysmal atrial fibrillation: Secondary | ICD-10-CM

## 2020-07-27 MED ORDER — ROSUVASTATIN CALCIUM 5 MG PO TABS: 5.0000 mg | ORAL_TABLET | Freq: Every day | ORAL | 3 refills | Status: DC

## 2020-07-27 NOTE — Patient Instructions (Addendum)
Medication Instructions:  Crestor 5 mg daily   If you need a refill on your cardiac medications before your next appointment, please call your pharmacy.    Lab work: No new labs needed   If you have labs (blood work) drawn today and your tests are completely normal, you will receive your results only by: Marland Kitchen MyChart Message (if you have MyChart) OR . A paper copy in the mail If you have any lab test that is abnormal or we need to change your treatment, we will call you to review the results.   Testing/Procedures: No new testing needed   Follow-Up: At [facility], you and your health needs are our priority.  As part of our continuing mission to provide you with exceptional heart care, we have created designated Provider Care Teams.  These Care Teams include your primary Cardiologist (physician) and Advanced Practice Providers (APPs -  Physician Assistants and Nurse Practitioners) who all work together to provide you with the care you need, when you need it.  . You will need a follow up appointment in 6 months  . Providers on your designated Care Team:   . Murray Hodgkins, NP . Christell Faith, PA-C . Marrianne Mood, PA-C  Any Other Special Instructions Will Be Listed Below (If Applicable).  COVID-19 Vaccine Information can be found at: ShippingScam.co.uk For questions related to vaccine distribution or appointments, please email [email or web address] or call [phone].

## 2020-08-13 ENCOUNTER — Ambulatory Visit: Payer: Medicare PPO | Admitting: Cardiovascular Disease

## 2020-09-09 ENCOUNTER — Telehealth: Payer: Self-pay | Admitting: Cardiovascular Disease

## 2020-09-09 NOTE — Telephone Encounter (Signed)
Was able to return mrs. Eduardo's phone call, reports weight gain of over 10 lbs in the past few months w/o changes in diet. Reports low sodium diet, reading labels, and reducing can foods. Gym at least 2 days a week, and walks at lunch time at work.   Reports increase swelling to knees dow to toes, very painful, sits a lot at work, try to stands, but pain is severe. Reports redness, puffiness, coldness, and burning sensation to foot and toes. At times reports her toes on bilateral feet are purple, more so on left foot. Pt concern for vascular issues.   Also, pt has read pamphlet on metoprolol and thinks her symptoms may be contributed to the side affect of metoprolol.   Reports changing her PCP in May, seeing Dr. Sabra Heck at [facility], may want to switch to [facility] Cardiologist as well d/t fact that Dr. Rockey Situ "seems to ignore some of my concerns, especially with swelling", but reports "no hard feelings if [facility] would want me to switch to their cardiologist". Advised pt that some see both [facility] and [facility], it is her discretion.   Was able to get Mrs. Bodmer schedule for tomorrow with Lorenso Quarry, PA-C at 3pm. Pt grateful for phone call and schedule appt. Nothing further at this time.

## 2020-09-09 NOTE — Telephone Encounter (Signed)
Pt c/o medication issue:  1. Name of Medication: metoprolol succinate  2. How are you currently taking this medication (dosage and times per day)? 100 mg in the morning  3. Are you having a reaction (difficulty breathing--STAT)?   4. What is your medication issue? Feet swelling up legs. Patient read the side effects and this was included. Patient has trouble walking due to the swelling. Toes occassionally turn blue

## 2020-09-10 ENCOUNTER — Ambulatory Visit: Payer: Medicare PPO | Admitting: Physician Assistant

## 2020-09-10 NOTE — Telephone Encounter (Signed)
Patient returning call.

## 2020-09-10 NOTE — Telephone Encounter (Signed)
Update - Scheduled from waitlist with [facility] on Monday 3-28.

## 2020-09-10 NOTE — Telephone Encounter (Signed)
Spoke with patient to reschedule appt for acute visit due to provider out today.    Rescheduled patient to 3/30 at 230 . Updating triage due to reason for visit and no sooner slots .

## 2020-09-10 NOTE — Telephone Encounter (Signed)
I spoke with the patient. I have advised her that Urban Gibson, NP had reviewed her chart.  I have advised her of Caitlin's thoughts/ recommendations.  I have confirmed with the patient that she is taking lasix (furosemide) 20 mg once daily as needed, which is not often. I have advised her that she can try taking lasix 20 mg once daily until her follow up appointment on Monday 3/28 with Dr. Rockey Situ. I told her if she currently has lasix with her she can take a dose now, but I would not take a dose any later than 2:00 pm today. The patient advised she does have lasix with her and will go ahead and take a dose.   The patient is also advised to keep her feet elevated when possible and watch her sodium intake as well. I have asked her to check daily weights and bring these with her to her appointment on Monday. I have reviewed with her how/ when to check daily weights.   The patient voices understanding of the above recommendations and is agreeable.

## 2020-09-10 NOTE — Telephone Encounter (Signed)
Called to make the patient aware of Laurann Montana, NPs recommendations. [email or web address].

## 2020-09-10 NOTE — Telephone Encounter (Signed)
Chart reviewed.   She had echo 05/2020 with normal LVEF 55-60% though grade 2 diastolic dysfunction which is likely contributory to her swelling. From Dr. Donivan Scull previous notes appears she spends a lot of time on her feet - would recommend trying to keep her feet elevated to help with swelling.   Medication list includes Furosemide (Lasix) 20mg  as-needed. Please confirm how often she has been taking this medication.  If she has not been taking regularly, recommend she start Furosemide 20mg  daily until her follow up with Dr. Rockey Situ Monday.  If she has been taking regularly, recommend she increase to Furosemide 40mg  daily until her follow up with Dr. Rockey Situ Monday.  Her renal function and electrolytes were normal in October when last checked and can be rechecked at upcoming follow up.   I have low suspicion her Metoprolol is contributory to weight gain, edema. Likely fluid retention from diastolic dysfunction. It is important that she keep her feet elevated, eat a low salt diet.   Loel Dubonnet, NP

## 2020-09-12 NOTE — Progress Notes (Signed)
Cardiology Office Note  Date:  09/13/2020   ID:  Veronica Stanley, DOB 01/12/1946, MRN [id number]  PCP:  Venia Carbon, MD   Chief Complaint  Patient presents with  . Other    Patient c/o Weight gain >10, Redness, swelling, cold/burning to toes & foot. Meds reviewed verbally with patient.     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 This was 66th percentile  Compliant with CPAP for sleep apnea  EKG personally reviewed by myself on todays visit  shows NSR with rate 64 beats per minute, nonspecific T wave abnormality 3 and aVF   PMH:   has a past medical history of Basal cell carcinoma, arm (11/13), Diverticulitis, GERD (gastroesophageal reflux disease), Hypertension, Menopausal symptoms, Osteoarthrosis involving, or with mention of more than one site, but not specified as generalized, multiple sites, Osteoporosis, and Peripheral vascular disease ([place]).  PSH:    Past Surgical History:  Procedure Laterality Date  . ABDOMINAL HYSTERECTOMY    . BREAST BIOPSY Left 2003   Left breast calcifications benign  . COLONOSCOPY WITH PROPOFOL N/A 04/22/2018   Procedure:  COLONOSCOPY WITH PROPOFOL;  Surgeon: Manya Silvas, MD;  Location: [facility] ENDOSCOPY;  Service: Endoscopy;  Laterality: N/A;  . DILATION AND CURETTAGE OF UTERUS     rupture- peritonitis & hysterectomy 12/79  . TOTAL HIP ARTHROPLASTY Left 10/19/2017   Procedure: LEFT TOTAL HIP ARTHROPLASTY ANTERIOR APPROACH;  Surgeon: Mcarthur Rossetti, MD;  Location: [facility] ORS;  Service: Orthopedics;  Laterality: Left;  Marland Kitchen VAGINAL DELIVERY     x2    Current Outpatient Medications  Medication Sig Dispense Refill  . apixaban (ELIQUIS) 5 MG TABS tablet Take 1 tablet (5 mg total) by mouth 2 (two) times daily. 60 tablet 6  . Cholecalciferol (VITAMIN D3) 1000 units CAPS Take 1,000 Units by mouth daily.     . COLLAGEN PO Take by mouth.    . diltiazem (CARDIZEM) 60 MG tablet Take 1 tablet (60 mg total) by mouth daily as needed. Take for elevated heart rate greater than 100 60 tablet 1  . furosemide (LASIX) 20 MG tablet Take 1 tablet (20 mg total) by mouth daily as needed. Take sparingly as needed per MD 30 tablet 3  . Glucosamine HCl (GLUCOSAMINE PO) Take 2 capsules by mouth in the morning.    Marland Kitchen ibuprofen (ADVIL,MOTRIN) 200 MG tablet Take 400 mg by mouth daily as needed for headache or mild pain.     . metoprolol succinate (TOPROL-XL) 100 MG 24 hr tablet Take 1 tablet (100 mg total) by mouth daily. 90 tablet 1  . Multiple Vitamin (MULTIVITAMIN) tablet Take 1 tablet by mouth daily.    . Omega 3 1000 MG CAPS Take 1,000 mg  by mouth daily.     Marland Kitchen omeprazole (PRILOSEC) 20 MG capsule TAKE ONE CAPSULE BY MOUTH DAILY AS NEEDED FOR HEARTBURN OR INDIGESTION. 30 capsule 2  . rosuvastatin (CRESTOR) 5 MG tablet Take 1 tablet (5 mg total) by mouth daily. 90 tablet 3  . Zinc 50 MG TABS Take 50 mg by mouth daily.      No current facility-administered medications for this visit.     Allergies:   Penicillins and Ibandronate sodium   Social History:  The patient  reports that she has never smoked. She has never used smokeless  tobacco. She reports current alcohol use of about 3.0 standard drinks of alcohol per week. She reports that she does not use drugs.   Family History:   family history includes Breast cancer in her cousin; Breast cancer (age of onset: 71) in her sister; Cancer in her maternal grandfather; Coronary artery disease in her father; Osteoporosis in her mother.    Review of Systems: Review of Systems  Constitutional: Negative.   HENT: Negative.   Respiratory: Negative.   Cardiovascular: Negative.   Gastrointestinal: Negative.   Musculoskeletal: Negative.   Neurological: Negative.   Psychiatric/Behavioral: Negative.   All other systems reviewed and are negative.   PHYSICAL EXAM: VS:  BP 132/68 (BP Location: Left Arm, Patient Position: Sitting, Cuff Size: Large)   Pulse 64   Ht 5\' 7"  (1.702 m)   Wt 244 lb (110.7 kg)   LMP  (LMP Unknown)   SpO2 94%   BMI 38.22 kg/m  , BMI Body mass index is 38.22 kg/m. Constitutional:  oriented to person, place, and time. No distress.  HENT:  Head: Grossly normal Eyes:  no discharge. No scleral icterus.  Neck: No JVD, no carotid bruits  Cardiovascular: Regular rate and rhythm, no murmurs appreciated Pulmonary/Chest: Clear to auscultation bilaterally, no wheezes or rails Abdominal: Soft.  no distension.  no tenderness.  Musculoskeletal: Normal range of motion Neurological:  normal muscle tone. Coordination normal. No atrophy Skin: Skin warm and dry Psychiatric: normal affect, pleasant   Recent Labs: 02/10/2020: ALT 9 04/12/2020: BUN 18; Creatinine, Ser 0.77; Hemoglobin 14.2; Magnesium 2.4; Platelets 279; Potassium 4.5; Sodium 140; TSH 3.454    Lipid Panel Lab Results  Component Value Date   CHOL 179 03/13/2018   HDL 49.30 03/13/2018   LDLCALC 102 (H) 03/13/2018   TRIG 138.0 03/13/2018     Wt Readings from Last 3 Encounters:  09/13/20 244 lb (110.7 kg)  07/27/20 246 lb (111.6 kg)  05/10/20 243 lb (110.2 kg)     ASSESSMENT AND  PLAN:  Problem List Items Addressed This Visit      Cardiology Problems   Essential hypertension, benign     Other   Morbid obesity ([place])    Other Visit Diagnoses    PAF (paroxysmal atrial fibrillation) ([place])    -  Primary   Hyperlipidemia, unspecified hyperlipidemia type       Leg edema         Atrial fib, paroxysmal Maintaining normal sinus rhythm on metoprolol Calcium channel blockers previously held for leg swelling Tolerating Eliquis Normal sinus rhythm today  Leg swelling Trace edema bilaterally Will take Lasix 20 to 40 mg every other day for the next 2 weeks with potassium Avoid calcium channel blockers Compression hose Likely component of fluid retention with venous insufficiency  Family hx of CAD, MI Non-smoker, no diabetes CT coronary calcium score 157  Morbid obesity We have encouraged continued exercise, careful diet  management in an effort to lose weight.    Total encounter time more than 25 minutes  Greater than 50% was spent in counseling and coordination of care with the patient    Signed, Esmond Plants, M.D., Ph.D. [place], [place]

## 2020-09-13 ENCOUNTER — Encounter: Payer: Self-pay | Admitting: Cardiovascular Disease

## 2020-09-13 ENCOUNTER — Other Ambulatory Visit: Payer: Self-pay

## 2020-09-13 ENCOUNTER — Ambulatory Visit: Payer: Medicare PPO | Admitting: Cardiovascular Disease

## 2020-09-13 VITALS — BP 132/68 | HR 64 | Ht 67.0 in | Wt 244.0 lb

## 2020-09-13 DIAGNOSIS — Z79899 Other long term (current) drug therapy: Secondary | ICD-10-CM | POA: Diagnosis not present

## 2020-09-13 DIAGNOSIS — R600 Localized edema: Secondary | ICD-10-CM | POA: Diagnosis not present

## 2020-09-13 DIAGNOSIS — I10 Essential (primary) hypertension: Secondary | ICD-10-CM | POA: Diagnosis not present

## 2020-09-13 DIAGNOSIS — R609 Edema, unspecified: Secondary | ICD-10-CM

## 2020-09-13 DIAGNOSIS — I480 Paroxysmal atrial fibrillation: Secondary | ICD-10-CM

## 2020-09-13 DIAGNOSIS — E785 Hyperlipidemia, unspecified: Secondary | ICD-10-CM

## 2020-09-13 MED ORDER — POTASSIUM CHLORIDE CRYS ER 20 MEQ PO TBCR: 20.0000 meq | EXTENDED_RELEASE_TABLET | Freq: Every day | ORAL | 3 refills | Status: DC | PRN

## 2020-09-13 NOTE — Patient Instructions (Addendum)
BMP in the hospital 2 weeks  Leg compressions  Medication Instructions:  Lasix 20-40 mg 4 days a week Moderate the fluid intake   If you need a refill on your cardiac medications before your next appointment, please call your pharmacy.    Lab work: No new labs needed   If you have labs (blood work) drawn today and your tests are completely normal, you will receive your results only by: Marland Kitchen MyChart Message (if you have MyChart) OR . A paper copy in the mail If you have any lab test that is abnormal or we need to change your treatment, we will call you to review the results.   Testing/Procedures: No new testing needed   Follow-Up: At [facility], you and your health needs are our priority.  As part of our continuing mission to provide you with exceptional heart care, we have created designated Provider Care Teams.  These Care Teams include your primary Cardiologist (physician) and Advanced Practice Providers (APPs -  Physician Assistants and Nurse Practitioners) who all work together to provide you with the care you need, when you need it.  . You will need a follow up appointment in 3 months  . Providers on your designated Care Team:   . Murray Hodgkins, NP . Christell Faith, PA-C . Marrianne Mood, PA-C  Any Other Special Instructions Will Be Listed Below (If Applicable).  COVID-19 Vaccine Information can be found at: ShippingScam.co.uk For questions related to vaccine distribution or appointments, please email vaccine@[email or web address] .com or call [phone])[phone].

## 2020-09-14 ENCOUNTER — Telehealth: Payer: Self-pay | Admitting: Cardiovascular Disease

## 2020-09-14 NOTE — Telephone Encounter (Signed)
Attempted to schedule.  LMOV to call office.   3 m fu per checkout 09-13-20

## 2020-09-15 ENCOUNTER — Ambulatory Visit: Payer: Medicare PPO | Admitting: Family

## 2020-09-28 ENCOUNTER — Other Ambulatory Visit
Admission: RE | Admit: 2020-09-28 | Discharge: 2020-09-28 | Disposition: A | Discharge status: Home / Self Care | Payer: Medicare PPO | Attending: Cardiovascular Disease | Admitting: Cardiovascular Disease

## 2020-09-28 DIAGNOSIS — R600 Localized edema: Secondary | ICD-10-CM | POA: Insufficient documentation

## 2020-09-28 DIAGNOSIS — R609 Edema, unspecified: Secondary | ICD-10-CM | POA: Insufficient documentation

## 2020-09-28 DIAGNOSIS — Z79899 Other long term (current) drug therapy: Secondary | ICD-10-CM | POA: Diagnosis not present

## 2020-09-28 LAB — BASIC METABOLIC PANEL
Anion gap: 9 (ref 5–15)
BUN: 17 mg/dL (ref 8–23)
CO2: 24 mmol/L (ref 22–32)
Calcium: 8.9 mg/dL (ref 8.9–10.3)
Chloride: 105 mmol/L (ref 98–111)
Creatinine, Ser: 0.8 mg/dL (ref 0.44–1.00)
GFR, Estimated: >60 mL/min (ref >60)
Glucose, Bld: 102 mg/dL — ABNORMAL HIGH (ref 70–99)
Potassium: 4 mmol/L (ref 3.5–5.1)
Sodium: 138 mmol/L (ref 135–145)

## 2020-09-30 NOTE — Progress Notes (Signed)
Results released to MyChart by Dr. Rockey Situ, pt has not reviewed her results, letter w/results sent in mail for pt's records.

## 2020-10-14 NOTE — Telephone Encounter (Signed)
Spoke with patient and reviewed that it would be best for her to come in so we can assess her symptoms. She reports increased swelling despite wearing compression hose and it is so bad that it is causing pain. Offered appointment for next week and she was agreeable with this plan. Confirmed date and time, she was appreciative for the call and recommendations. She verbalized understanding of our conversation, agreement with plan, and had no further questions for now.

## 2020-10-20 ENCOUNTER — Encounter: Payer: Self-pay | Admitting: Family

## 2020-10-20 ENCOUNTER — Ambulatory Visit: Payer: Medicare PPO | Admitting: Family

## 2020-10-20 ENCOUNTER — Other Ambulatory Visit: Payer: Self-pay

## 2020-10-20 VITALS — BP 154/84 | HR 62 | Ht 67.0 in | Wt 243.0 lb

## 2020-10-20 DIAGNOSIS — Z7901 Long term (current) use of anticoagulants: Secondary | ICD-10-CM

## 2020-10-20 DIAGNOSIS — I480 Paroxysmal atrial fibrillation: Secondary | ICD-10-CM | POA: Diagnosis not present

## 2020-10-20 DIAGNOSIS — I5189 Other ill-defined heart diseases: Secondary | ICD-10-CM | POA: Diagnosis not present

## 2020-10-20 DIAGNOSIS — I872 Venous insufficiency (chronic) (peripheral): Secondary | ICD-10-CM | POA: Diagnosis not present

## 2020-10-20 DIAGNOSIS — R600 Localized edema: Secondary | ICD-10-CM | POA: Diagnosis not present

## 2020-10-20 MED ORDER — FUROSEMIDE 20 MG PO TABS: 20.0000 mg | ORAL_TABLET | Freq: Every day | ORAL | 5 refills | Status: DC

## 2020-10-20 MED ORDER — POTASSIUM CHLORIDE CRYS ER 10 MEQ PO TBCR: 10.0000 meq | EXTENDED_RELEASE_TABLET | Freq: Every day | ORAL | 5 refills | Status: DC

## 2020-10-20 NOTE — Patient Instructions (Addendum)
Medication Instructions:  Your physician has recommended you make the following change in your medication:   CHANGE Furosemide (Lasix) to 20mg  daily  CHANGE Potassium to 80mEq once daily  *If you need a refill on your cardiac medications before your next appointment, please call your pharmacy*  Lab Work: Your physician recommends that you return for lab work in 1-2 weeks for BMP at the [facility] to recheck your kidney blood work and electrolytes.   [facility] Entrance at [facility] 1st desk on the right to check in, past the screening table Lab hours: Monday- Friday (7:30 am- 5:30 pm)  If you have labs (blood work) drawn today and your tests are completely normal, you will receive your results only by: Marland Kitchen MyChart Message (if you have MyChart) OR . A paper copy in the mail If you have any lab test that is abnormal or we need to change your treatment, we will call you to review the results.   Testing/Procedures: Your EKG today shows normal sinus rhythm.    Follow-Up: At [facility], you and your health needs are our priority.  As part of our continuing mission to provide you with exceptional heart care, we have created designated Provider Care Teams.  These Care Teams include your primary Cardiologist (physician) and Advanced Practice Providers (APPs -  Physician Assistants and Nurse Practitioners) who all work together to provide you with the care you need, when you need it.  We recommend signing up for the patient portal called "MyChart".  Sign up information is provided on this After Visit Summary.  MyChart is used to connect with patients for Virtual Visits (Telemedicine).  Patients are able to view lab/test results, encounter notes, upcoming appointments, etc.  Non-urgent messages can be sent to your provider as well.   To learn more about what you can do with MyChart, go to [email or web address].    Your next appointment:   As scheduled with Dr. Rockey Situ  Other  Instructions  To prevent or reduce lower extremity swelling: . Eat a low salt diet. Salt makes the body hold onto extra fluid which causes swelling. . Sit with legs elevated. For example, in the recliner or on an [place].  . Wear knee-high compression stockings during the daytime. Ones labeled 15-20 mmHg provide good compression.  Heart Healthy Diet Recommendations: A low-salt diet is recommended. Meats should be grilled, baked, or boiled. Avoid fried foods. Focus on lean protein sources like fish or chicken with vegetables and fruits. The American Heart Association is a Microbiologist!  American Heart Association Diet and Lifeystyle Recommendations   Exercise recommendations: The American Heart Association recommends 150 minutes of moderate intensity exercise weekly. Try 30 minutes of moderate intensity exercise 4-5 times per week. This could include walking, jogging, or swimming.  Tips to Measure your Blood Pressure Correctly  Here's what you can do to ensure a correct reading: . Don't drink a caffeinated beverage or smoke during the 30 minutes before the test. . Sit quietly for five minutes before the test begins. . During the measurement, sit in a chair with your feet on the floor and your arm supported so your elbow is at about heart level. . The inflatable part of the cuff should completely cover at least 80% of your upper arm, and the cuff should be placed on bare skin, not over a shirt. . Don't talk during the measurement. . Have your blood pressure measured twice, with a brief break in between. If the readings are different  by 5 points or more, have it done a third time.  Blood pressure categories  Blood pressure category SYSTOLIC (upper number)  DIASTOLIC (lower number)  Normal Less than 120 mm Hg and Less than 80 mm Hg  Elevated 120-129 mm Hg and Less than 80 mm Hg  High blood pressure: Stage 1 hypertension 130-139 mm Hg or 80-89 mm Hg  High blood pressure: Stage 2  hypertension 140 mm Hg or higher or 90 mm Hg or higher  Hypertensive crisis (consult your doctor immediately) Higher than 180 mm Hg and/or Higher than 120 mm Hg  Source: American Heart Association and American Stroke Association. For more on getting your blood pressure under control, buy Controlling Your Blood Pressure, a Special Health Report from [facility].   Blood Pressure Log   Date   Time  Blood Pressure  Position  Example: Nov 1 9 AM 124/78 sitting

## 2020-10-20 NOTE — Progress Notes (Signed)
Office Visit    Patient Name: Veronica Stanley Date of Encounter: 10/21/2020  PCP:  Rusty Aus, MD   [place]  Cardiologist:  Ida Rogue, MD  Advanced Practice Provider:  No care team member to display Electrophysiologist:  None    Chief Complaint    Veronica Stanley is a 75 y.o. female with a hx of PAF, hypertension, OSA on CPAP, venous insufficiency, diastolic dysfunction, coronary calcification on CT presents today for lower extremity edema  Past Medical History    Past Medical History:  Diagnosis Date  . Basal cell carcinoma, arm 11/13   on arm and face   . Diverticulitis    colon  . GERD (gastroesophageal reflux disease)   . Hypertension    patient denies not on meds   . Menopausal symptoms   . Osteoarthrosis involving, or with mention of more than one site, but not specified as generalized, multiple sites   . Osteoporosis   . Peripheral vascular disease [facility])    Past Surgical History:  Procedure Laterality Date  . ABDOMINAL HYSTERECTOMY    . BREAST BIOPSY Left 2003   Left breast calcifications benign  . COLONOSCOPY WITH PROPOFOL N/A 04/22/2018   Procedure: COLONOSCOPY WITH PROPOFOL;  Surgeon: Manya Silvas, MD;  Location: [facility] ENDOSCOPY;  Service: Endoscopy;  Laterality: N/A;  . DILATION AND CURETTAGE OF UTERUS     rupture- peritonitis & hysterectomy 12/79  . TOTAL HIP ARTHROPLASTY Left 10/19/2017   Procedure: LEFT TOTAL HIP ARTHROPLASTY ANTERIOR APPROACH;  Surgeon: Mcarthur Rossetti, MD;  Location: [facility] ORS;  Service: Orthopedics;  Laterality: Left;  Marland Kitchen VAGINAL DELIVERY     x2    Allergies  Allergies  Allergen Reactions  . Penicillins Anaphylaxis, Shortness Of Breath, Swelling and Rash    Has patient had a PCN reaction causing immediate rash, facial/tongue/throat swelling, SOB or lightheadedness with hypotension: Yes Has patient had a PCN reaction causing severe rash involving mucus membranes or skin necrosis: Yes Has  patient had a PCN reaction that required hospitalization: No Has patient had a PCN reaction occurring within the last 10 years: No If all of the above answers are "NO", then may proceed with Cephalosporin use.   . Ibandronate Sodium Other (See Comments)    aches    History of Present Illness    Veronica Stanley is a 75 y.o. female with a hx of PAF, hypertension, OSA on CPAP, venous insufficiency, diastolic dysfunction, coronary calcification on CT last seen 09/13/2020 by Dr. Rockey Situ.  Initial cardiac evaluation October 2021 in setting of hospitalization with atrial fibrillation with RVR.  She converted to normal sinus rhythm spontaneously.  Echo 05/2020 LVEF 55 to 60%, wall motion abnormalities, grade 2 diastolic dysfunction, RV normal size and function, mildly elevated PASP, mild MR.  CT cardiac scoring 06/23/2020 with coronary calcium score of 157 placing her in the 6 percentile for age and sex matched control.  She was recommended for guideline directed medical therapy and risk factor modification.  She presents today for lower extremity edema which she tells me has progressively gotten worse in the last 2 or 3 months.  She is presently working with orthopedics to determine timing of knee surgery.  Tells me she will likely need both knees done but plans to do right and left.  No surgery date set at this time.  She had gel injections in November with minimal improvement in symptoms.  She understandably would like to get her  swelling under control prior to having the surgery as she feels it limits her mobility.  Works 3 days per week at [employer]. 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  Has has 1 compression stockings intermittently and notes they do help her swelling but has not worn them recently.. Tells me she has been taking her Lasix 20mg  a few times per week. It does improve her swelling.  Does note eating occasional high sodium foods such as chips. She drinks mostly water  throughout the day but total <2L.  Reports no shortness of breath nor dyspnea on exertion. Reports no chest pain, pressure, or tightness. No  orthopnea, PND. Reports no palpitations.   EKGs/Labs/Other Studies Reviewed:   The following studies were reviewed today:  Echo 05/25/20    1. Left ventricular ejection fraction, by estimation, is 55 to 60%. The  left ventricle has normal function. The left ventricle has no regional  wall motion abnormalities. Left ventricular diastolic parameters are  consistent with Grade II diastolic  dysfunction (pseudonormalization).   2. Right ventricular systolic function is normal. The right ventricular  size is normal. There is mildly elevated pulmonary artery systolic  pressure.   3. The mitral valve is grossly normal. Mild mitral valve regurgitation.   4. The aortic valve has an indeterminant number of cusps. Aortic valve  regurgitation is trivial. No aortic stenosis is present.   5. The inferior vena cava dilated; unable to assess respiratory  variation.    EKG:  EKG is ordered today.  The ekg ordered today demonstrates NSR 62 bpm with no acute ST/T wave changes.   Recent Labs: 02/10/2020: ALT 9 04/12/2020: Hemoglobin 14.2; Magnesium 2.4; Platelets 279; TSH 3.454 09/28/2020: BUN 17; Creatinine, Ser 0.80; Potassium 4.0; Sodium 138  Recent Lipid Panel    Component Value Date/Time   CHOL 179 03/13/2018 1223   TRIG 138.0 03/13/2018 1223   HDL 49.30 03/13/2018 1223   CHOLHDL 4 03/13/2018 1223   VLDL 27.6 03/13/2018 1223   LDLCALC 102 (H) 03/13/2018 1223   LDLDIRECT 134.7 03/03/2011 1046    Home Medications   Current Meds  Medication Sig  . apixaban (ELIQUIS) 5 MG TABS tablet Take 1 tablet (5 mg total) by mouth 2 (two) times daily.  . Cholecalciferol (VITAMIN D3) 1000 units CAPS Take 1,000 Units by mouth daily.   . COLLAGEN PO Take by mouth.  . diltiazem (CARDIZEM) 60 MG tablet Take 1 tablet (60 mg total) by mouth daily as needed. Take for  elevated heart rate greater than 100  . Glucosamine HCl (GLUCOSAMINE PO) Take 2 capsules by mouth in the morning.  Marland Kitchen ibuprofen (ADVIL,MOTRIN) 200 MG tablet Take 400 mg by mouth daily as needed for headache or mild pain.   . metoprolol succinate (TOPROL-XL) 100 MG 24 hr tablet Take 1 tablet (100 mg total) by mouth daily.  . Multiple Vitamin (MULTIVITAMIN) tablet Take 1 tablet by mouth daily.  . Omega 3 1000 MG CAPS Take 1,000 mg by mouth daily.   Marland Kitchen omeprazole (PRILOSEC) 20 MG capsule TAKE ONE CAPSULE BY MOUTH DAILY AS NEEDED FOR HEARTBURN OR INDIGESTION.  Marland Kitchen potassium chloride (KLOR-CON) 10 MEQ tablet Take 1 tablet (10 mEq total) by mouth daily.  . rosuvastatin (CRESTOR) 5 MG tablet Take 1 tablet (5 mg total) by mouth daily.  . Zinc 50 MG TABS Take 50 mg by mouth daily.   . [DISCONTINUED] potassium chloride SA (KLOR-CON) 20 MEQ tablet Take 1 tablet (20 mEq total)  by mouth daily as needed.     Review of Systems  All other systems reviewed and are otherwise negative except as noted above.  Physical Exam    VS:  BP (!) 154/84   Pulse 62   Ht 5\' 7"  (1.702 m)   Wt 243 lb (110.2 kg)   LMP  (LMP Unknown)   BMI 38.06 kg/m  , BMI Body mass index is 38.06 kg/m.  Wt Readings from Last 3 Encounters:  10/20/20 243 lb (110.2 kg)  09/13/20 244 lb (110.7 kg)  07/27/20 246 lb (111.6 kg)    GEN: Well nourished, overweight, well developed, in no acute distress. HEENT: normal. Neck: Supple, no JVD, carotid bruits, or masses. Cardiac: RRR, no murmurs, rubs, or gallops. No clubbing, cyanosis. Trace bilateral pedal edema..  Radials/DP/PT 2+ and equal bilaterally.  Respiratory:  Respirations regular and unlabored, clear to auscultation bilaterally. GI: Soft, nontender, nondistended. MS: No deformity or atrophy. Skin: Warm and dry, no rash. Neuro:  Strength and sensation are intact. Psych: Normal affect.  Assessment & Plan    1. PAF/chronic anticoagulation- Maintaining normal sinus rhythm.  Has not  required as needed diltiazem.  Continue Toprol 100 mg daily.  Continue Eliquis 5 mg twice daily due to CHA2DS2-VASc of at least 4 (age, gender, HTN, CAD).  Denies bleeding complications.  2. Lower extremity edema / Venous insufficiency / Diastolic dysfunction - likely multifactorial venous insufficiency, dependent edema from sitting at work, grade 2 diastolic dysfunction noted by echo.  Change Lasix from as needed to 20 mg daily.  Start potassium 10 mEq daily. BMP in1-2 weeks at [place] for monitoring. Educated on prevention including low-salt diet, elevating lower extremities, compression stockings which she was agreeable to.  If not improved at upcoming follow-up could consider referral to vein and vascular though I anticipate she will notice improvement with the previous measures.  3. Coronary artery calcification on CT- no anginal symptoms.  No indication for ischemic evaluation at this time.  GDMT includes Toprol, rosuvastatin.  No aspirin due to chronic anticoagulation. Heart healthy diet and regular cardiovascular exercise encouraged.   4. Morbid obesity- Weight loss via diet and exercise encouraged. Discussed the impact being overweight would have on cardiovascular risk.  5. HTN -BP mildly elevated.  Encouraged home monitoring and she will report BP consistently greater than 130/80.Continue Toprol 100 mg daily.  Change Lasix to 20 mg daily as detailed above.  Future considerations include addition of ARB.  Disposition: Follow up in June as scheduled with Dr. Rockey Situ   Signed, Loel Dubonnet, NP 10/21/2020, 7:57 AM [place]

## 2020-10-21 ENCOUNTER — Encounter: Payer: Self-pay | Admitting: Family

## 2020-10-27 DIAGNOSIS — E538 Deficiency of other specified B group vitamins: Secondary | ICD-10-CM | POA: Diagnosis not present

## 2020-10-27 DIAGNOSIS — Z0000 Encounter for general adult medical examination without abnormal findings: Secondary | ICD-10-CM | POA: Diagnosis not present

## 2020-10-27 DIAGNOSIS — M5116 Intervertebral disc disorders with radiculopathy, lumbar region: Secondary | ICD-10-CM | POA: Diagnosis not present

## 2020-10-27 DIAGNOSIS — M519 Unspecified thoracic, thoracolumbar and lumbosacral intervertebral disc disorder: Secondary | ICD-10-CM | POA: Diagnosis not present

## 2020-10-27 DIAGNOSIS — M4699 Unspecified inflammatory spondylopathy, multiple sites in spine: Secondary | ICD-10-CM | POA: Diagnosis not present

## 2020-10-27 DIAGNOSIS — I482 Chronic atrial fibrillation, unspecified: Secondary | ICD-10-CM | POA: Diagnosis not present

## 2020-10-27 DIAGNOSIS — M5117 Intervertebral disc disorders with radiculopathy, lumbosacral region: Secondary | ICD-10-CM | POA: Diagnosis not present

## 2020-10-27 DIAGNOSIS — R739 Hyperglycemia, unspecified: Secondary | ICD-10-CM | POA: Diagnosis not present

## 2020-10-27 DIAGNOSIS — I739 Peripheral vascular disease, unspecified: Secondary | ICD-10-CM | POA: Diagnosis not present

## 2020-10-27 DIAGNOSIS — M4726 Other spondylosis with radiculopathy, lumbar region: Secondary | ICD-10-CM | POA: Diagnosis not present

## 2020-10-27 DIAGNOSIS — M1611 Unilateral primary osteoarthritis, right hip: Secondary | ICD-10-CM | POA: Diagnosis not present

## 2020-10-27 DIAGNOSIS — E782 Mixed hyperlipidemia: Secondary | ICD-10-CM | POA: Diagnosis not present

## 2020-10-30 ENCOUNTER — Other Ambulatory Visit: Payer: Self-pay | Admitting: Cardiovascular Disease

## 2020-11-02 ENCOUNTER — Encounter: Payer: Self-pay | Admitting: Family

## 2020-11-11 DIAGNOSIS — E538 Deficiency of other specified B group vitamins: Secondary | ICD-10-CM | POA: Diagnosis not present

## 2020-11-11 DIAGNOSIS — M5116 Intervertebral disc disorders with radiculopathy, lumbar region: Secondary | ICD-10-CM | POA: Diagnosis not present

## 2020-11-11 DIAGNOSIS — Z79899 Other long term (current) drug therapy: Secondary | ICD-10-CM | POA: Diagnosis not present

## 2020-11-11 DIAGNOSIS — I482 Chronic atrial fibrillation, unspecified: Secondary | ICD-10-CM | POA: Diagnosis not present

## 2020-11-29 MED ORDER — AMLODIPINE BESYLATE 2.5 MG PO TABS: 2.5000 mg | ORAL_TABLET | Freq: Every day | ORAL | 3 refills | Status: DC

## 2020-11-29 NOTE — Telephone Encounter (Signed)
Deberah Pelton, NP  Waylan Rocher, LPN  Please contact Ms. Wisor and let her know that I am covering Caitlin's inbox because she is out of town on vacation.  I have reviewed her recent lab results and reported blood pressures.  Her lab work is within normal limits.  Her blood pressures are still slightly elevated.  We will add amlodipine 2.5 mg daily to her medication regimen.  Please ask her to continue to monitor her blood pressures 2-3 times per week and give Caitlin a blood pressure log report in 1 month.  Please ask her to continue to stay physically active (goal 150 minutes of physical activity per week), and eat a low-sodium diet.  Please give her thesalty 6 diet sheet.  Thank you.  LM2CB New rx sent, Mychart email sent

## 2020-12-16 ENCOUNTER — Other Ambulatory Visit: Payer: Self-pay | Admitting: Cardiovascular Disease

## 2021-01-09 ENCOUNTER — Other Ambulatory Visit: Payer: Self-pay | Admitting: Cardiovascular Disease

## 2021-01-10 NOTE — Telephone Encounter (Signed)
Refill Request.  

## 2021-01-10 NOTE — Telephone Encounter (Signed)
Prescription refill request for Eliquis received. Indication: PAF Last office visit: 10/20/20  Vella Raring NP Scr: 0.7 on 10/27/20 Age:  75 Weight: 110.2kg  Based on above findings Eliquis '5mg'$  twice daily is the appropriate dose.  Refill approved.

## 2021-01-26 ENCOUNTER — Other Ambulatory Visit: Payer: Self-pay | Admitting: Cardiovascular Disease

## 2021-01-31 NOTE — Progress Notes (Signed)
Cardiology Office Note  Date:  02/01/2021   ID:  Veronica Stanley, DOB 1945/11/11, MRN [id number]  PCP:  Rusty Aus, MD   Chief Complaint  Patient presents with   3 month follow up     "Doing well." Medications reviewed by the patient verbally.     HPI:  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 This was 66th percentile  Compliant with CPAP for sleep apnea  EKG personally reviewed by myself on todays visit  shows NSR with rate 55 beats per minute, no significant ST-T wave changes   PMH:   has a past medical history of Basal cell carcinoma, arm (11/13), Diverticulitis, GERD (gastroesophageal reflux disease), Hypertension, Menopausal symptoms, Osteoarthrosis involving, or with mention of more than one site, but not specified as generalized, multiple sites, Osteoporosis, and Peripheral vascular disease ([place]).  PSH:    Past Surgical History:  Procedure Laterality Date   ABDOMINAL HYSTERECTOMY     BREAST BIOPSY Left 2003   Left breast calcifications benign   COLONOSCOPY WITH PROPOFOL N/A 04/22/2018   Procedure: COLONOSCOPY WITH PROPOFOL;  Surgeon: Manya Silvas, MD;  [facility] ENDOSCOPY;  Service: Endoscopy;  Laterality: N/A;   DILATION AND CURETTAGE OF UTERUS     rupture- peritonitis & hysterectomy 12/79    TOTAL HIP ARTHROPLASTY Left 10/19/2017   Procedure: LEFT TOTAL HIP ARTHROPLASTY ANTERIOR APPROACH;  Surgeon: Mcarthur Rossetti, MD;  Location: [facility] ORS;  Service: Orthopedics;  Laterality: Left;   VAGINAL DELIVERY     x2    Current Outpatient Medications  Medication Sig Dispense Refill   amLODipine (NORVASC) 2.5 MG tablet Take 1 tablet (2.5 mg total) by mouth daily. 30 tablet 3   Cholecalciferol (VITAMIN D3) 1000 units CAPS Take 1,000 Units by mouth daily.      COLLAGEN PO Take by mouth.     diltiazem (CARDIZEM) 60 MG tablet Take 1 tablet (60 mg total) by mouth daily as needed. Take for elevated heart rate greater than 100 60 tablet 1   ELIQUIS 5 MG TABS tablet TAKE 1 TABLET BY MOUTH TWICE A DAY 60 tablet 6   etodolac (LODINE) 400 MG tablet Take by mouth.     furosemide (LASIX) 20 MG tablet Take 1 tablet (20 mg total) by mouth daily. 30 tablet 5   gabapentin (NEURONTIN) 100 MG capsule Take 100 mg by mouth at bedtime.     Glucosamine HCl (GLUCOSAMINE PO) Take 2 capsules by mouth in the morning.     ibuprofen (ADVIL,MOTRIN) 200 MG tablet Take 400 mg by mouth daily as needed for headache or mild pain.      metoprolol succinate (TOPROL-XL) 100 MG 24 hr tablet TAKE 1 TABLET BY MOUTH EVERY DAY 90 tablet 0   Multiple Vitamin (MULTIVITAMIN) tablet Take 1 tablet by mouth daily.  omeprazole (PRILOSEC) 20 MG capsule TAKE ONE CAPSULE BY MOUTH DAILY AS NEEDED FOR HEARTBURN OR INDIGESTION. 30 capsule 2   potassium chloride (KLOR-CON) 10 MEQ tablet Take 1 tablet (10 mEq total) by mouth daily. 30 tablet 5   rosuvastatin (CRESTOR) 5 MG tablet Take 1 tablet (5 mg total) by mouth daily. 90 tablet 3   Zinc 50 MG TABS Take 50 mg by mouth daily.      No current facility-administered medications for this visit.     Allergies:   Penicillins and Ibandronate sodium   Social History:  The patient  reports that she has never smoked. She has never used smokeless tobacco. She reports current alcohol use of  about 3.0 standard drinks per week. She reports that she does not use drugs.   Family History:   family history includes Breast cancer in her cousin; Breast cancer (age of onset: 74) in her sister; Cancer in her maternal grandfather; Coronary artery disease in her father; Osteoporosis in her mother.    Review of Systems: Review of Systems  Constitutional: Negative.   HENT: Negative.    Respiratory: Negative.    Cardiovascular: Negative.   Gastrointestinal: Negative.   Musculoskeletal: Negative.   Neurological: Negative.   Psychiatric/Behavioral: Negative.    All other systems reviewed and are negative.  PHYSICAL EXAM: VS:  BP 120/80 (BP Location: Left Arm, Patient Position: Sitting, Cuff Size: Normal)   Pulse (!) 55   Ht '5\' 7"'$  (1.702 m)   Wt 239 lb 8 oz (108.6 kg)   LMP  (LMP Unknown)   SpO2 97%   BMI 37.51 kg/m  , BMI Body mass index is 37.51 kg/m. Constitutional:  oriented to person, place, and time. No distress.  HENT:  Head: Grossly normal Eyes:  no discharge. No scleral icterus.  Neck: No JVD, no carotid bruits  Cardiovascular: Regular rate and rhythm, no murmurs appreciated Pulmonary/Chest: Clear to auscultation bilaterally, no wheezes or rails Abdominal: Soft.  no distension.  no tenderness.  Musculoskeletal: Normal range of motion Neurological:  normal muscle tone. Coordination normal. No atrophy Skin: Skin warm and dry Psychiatric: normal affect, pleasant  Recent Labs: 02/10/2020: ALT 9 04/12/2020: Hemoglobin 14.2; Magnesium 2.4; Platelets 279; TSH 3.454 09/28/2020: BUN 17; Creatinine, Ser 0.80; Potassium 4.0; Sodium 138    Lipid Panel Lab Results  Component Value Date   CHOL 179 03/13/2018   HDL 49.30 03/13/2018   LDLCALC 102 (H) 03/13/2018   TRIG 138.0 03/13/2018     Wt Readings from Last 3 Encounters:  02/01/21 239 lb 8 oz (108.6 kg)  10/20/20 243 lb (110.2 kg)  09/13/20 244 lb (110.7 kg)     ASSESSMENT AND PLAN:  Problem List Items Addressed  This Visit     Morbid obesity ([place])   Other Visit Diagnoses     PAF (paroxysmal atrial fibrillation) ([place])    -  Primary   Relevant Orders   EKG [id number]   Diastolic dysfunction       Relevant Orders   EKG 12-Lead   Leg edema         Atrial fib, paroxysmal Maintaining normal sinus rhythm on metoprolol Calcium channel blockers previously held for leg swelling Will hold her amlodipine again Tolerating Eliquis Normal sinus rhythm today  Leg swelling Recommend she hold amlodipine Avoid calcium channel blockers Recommend she take Lasix at least every other day with potassium Compression hose Likely component of fluid retention with venous insufficiency  Family hx of CAD, MI Non-smoker, no  diabetes CT coronary calcium score 157  Morbid obesity We have encouraged continued exercise, careful diet management in an effort to lose weight.  Hypertension Continue metoprolol, stop amlodipine given leg swelling with associated pain, other blood pressure medications could be initiated if blood pressure runs high 123456 systolic today    Total encounter time more than 25 minutes  Greater than 50% was spent in counseling and coordination of care with the patient    Signed, Esmond Plants, M.D., Ph.D. [place], [place]

## 2021-02-01 ENCOUNTER — Other Ambulatory Visit: Payer: Self-pay

## 2021-02-01 ENCOUNTER — Ambulatory Visit: Payer: Medicare PPO | Admitting: Cardiovascular Disease

## 2021-02-01 ENCOUNTER — Encounter: Payer: Self-pay | Admitting: Cardiovascular Disease

## 2021-02-01 VITALS — BP 120/80 | HR 55 | Ht 67.0 in | Wt 239.5 lb

## 2021-02-01 DIAGNOSIS — I5189 Other ill-defined heart diseases: Secondary | ICD-10-CM | POA: Diagnosis not present

## 2021-02-01 DIAGNOSIS — R600 Localized edema: Secondary | ICD-10-CM

## 2021-02-01 DIAGNOSIS — I480 Paroxysmal atrial fibrillation: Secondary | ICD-10-CM

## 2021-02-01 MED ORDER — METOPROLOL SUCCINATE ER 100 MG PO TB24: 100.0000 mg | ORAL_TABLET | Freq: Every day | ORAL | 3 refills | Status: DC

## 2021-02-01 MED ORDER — APIXABAN 5 MG PO TABS: 5.0000 mg | ORAL_TABLET | Freq: Two times a day (BID) | ORAL | 3 refills | Status: DC

## 2021-02-01 NOTE — Patient Instructions (Addendum)
Medication Instructions:  Stop amlodipine Start lasix 20 to 40 mg every other day  If you need a refill on your cardiac medications before your next appointment, please call your pharmacy.   Lab work: No new labs needed  Testing/Procedures: No new testing needed  Follow-Up: At [facility], you and your health needs are our priority.  As part of our continuing mission to provide you with exceptional heart care, we have created designated Provider Care Teams.  These Care Teams include your primary Cardiologist (physician) and Advanced Practice Providers (APPs -  Physician Assistants and Nurse Practitioners) who all work together to provide you with the care you need, when you need it.  You will need a follow up appointment in 12 months  Providers on your designated Care Team:   Murray Hodgkins, NP Christell Faith, PA-C Marrianne Mood, PA-C [place], [place]  COVID-19 Vaccine Information can be found at: ShippingScam.co.uk For questions related to vaccine distribution or appointments, please email [email or web address] or call 339 801 8863.

## 2021-02-22 ENCOUNTER — Encounter: Payer: Medicare PPO | Admitting: Internal Medicine

## 2021-03-29 ENCOUNTER — Ambulatory Visit: Payer: Medicare PPO | Admitting: Physician Assistant

## 2021-03-29 ENCOUNTER — Encounter: Payer: Self-pay | Admitting: Physician Assistant

## 2021-03-29 ENCOUNTER — Ambulatory Visit: Payer: Self-pay

## 2021-03-29 DIAGNOSIS — M25551 Pain in right hip: Secondary | ICD-10-CM

## 2021-03-29 NOTE — Progress Notes (Signed)
Office Visit Note   Patient: Veronica Stanley           Date of Birth: May 01, 1946           MRN: [id number] Visit Date: 03/29/2021              Requested by: Rusty Aus, MD [facility] [place],  [place] [address] PCP: Rusty Aus, MD   Assessment & Plan: Visit Diagnoses:  1. Pain in right hip     Plan: We will send her for an intra-articular injection in the right hip have her follow-up approximately 2 weeks after the injection and see how she is doing overall.  Questions encouraged and answered at length.  Follow-Up Instructions: Return in about 2 weeks (around 04/12/2021).   Orders:  Orders Placed This Encounter  Procedures   XR HIP UNILAT W OR W/O PELVIS 2-3 VIEWS RIGHT   No orders of the defined types were placed in this encounter.     Procedures: No procedures performed   Clinical Data: No additional findings.   Subjective: Chief Complaint  Patient presents with   Right Hip - Pain    HPI Veronica Stanley is a 75 year old female well-known to Dr. Ninfa Linden service comes in today with right hip pain for the last 3 weeks.  Pains are really been in her lower back buttocks region.  She denies any groin pain.  She does have a history of left total hip arthroplasty performed by Dr. Ninfa Linden on 10/19/2017 which is doing well.  She has had no new injury.  She has tried Aleve.  She is using a walker to ambulate.  Pain can shoot down to the knee at times.  But no numbness tingling. Review of Systems See HPI otherwise negative  Objective: Vital Signs: LMP  (LMP Unknown)   Physical Exam General well-developed well-nourished female no acute distress ambulates with rolling walker. Ortho Exam Bilateral hips left hip excellent range of motion without pain.  Right hip she has good range of motion of the hip.  While seated she has no significant pain with internal or external rotation.  Lying down low she has pain with internal rotation of  the right hip only. Specialty Comments:  No specialty comments available.  Imaging: XR HIP UNILAT W OR W/O PELVIS 2-3 VIEWS RIGHT  Result Date: 03/29/2021 AP pelvis lateral view right hip: Both hips well located.  Status post left total hip arthroplasty with well-seated components.  Right hip with near total loss of joint space.  Periarticular spurring is noted.    PMFS History: Patient Active Problem List   Diagnosis Date Noted   Paroxysmal atrial fibrillation ([place]) 04/27/2020   Obstructive sleep apnea 04/17/2018   Sleep disorder 03/13/2018   Morbid obesity ([place]) 11/28/2017   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   Unilateral primary osteoarthritis, left hip 09/17/2017   Osteoarthritis of both hips 11/10/2015   Advance directive discussed with patient 11/03/2014   Routine general medical examination at a health care facility 03/03/2011   Osteoarthritis, multiple sites    GERD 10/11/2007   ACTINIC KERATOSIS 04/17/2007   Essential hypertension, benign 03/28/2007   DIVERTICULOSIS, COLON 03/28/2007   OSTEOPOROSIS 03/28/2007   Past Medical History:  Diagnosis Date   Basal cell carcinoma, arm 11/13   on arm and face    Diverticulitis    colon   GERD (gastroesophageal reflux disease)    Hypertension    patient  denies not on meds    Menopausal symptoms    Osteoarthrosis involving, or with mention of more than one site, but not specified as generalized, multiple sites    Osteoporosis    Peripheral vascular disease ([place])     Family History  Problem Relation Age of Onset   Osteoporosis Mother    Coronary artery disease Father    Breast cancer Sister 37       twice   Cancer Maternal Grandfather        colon cancer   Breast cancer Cousin    Diabetes Neg Hx    Hypertension Neg Hx     Past Surgical History:  Procedure Laterality Date   ABDOMINAL HYSTERECTOMY     BREAST BIOPSY Left 2003   Left breast calcifications benign   COLONOSCOPY WITH PROPOFOL N/A  04/22/2018   Procedure: COLONOSCOPY WITH PROPOFOL;  Surgeon: Manya Silvas, MD;  Location[facility] ENDOSCOPY;  Service: Endoscopy;  Laterality: N/A;   DILATION AND CURETTAGE OF UTERUS     rupture- peritonitis & hysterectomy 12/79   TOTAL HIP ARTHROPLASTY Left 10/19/2017   Procedure: LEFT TOTAL HIP ARTHROPLASTY ANTERIOR APPROACH;  Surgeon: Mcarthur Rossetti, MD;  Location: [facility] ORS;  Service: Orthopedics;  Laterality: Left;   VAGINAL DELIVERY     x2   Social History   Occupational History   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

## 2021-03-30 NOTE — Addendum Note (Signed)
Addended by: Robyne Peers on: 03/30/2021 01:58 PM   Modules accepted: Orders

## 2021-04-04 DIAGNOSIS — R600 Localized edema: Secondary | ICD-10-CM | POA: Diagnosis not present

## 2021-04-04 DIAGNOSIS — M5116 Intervertebral disc disorders with radiculopathy, lumbar region: Secondary | ICD-10-CM | POA: Diagnosis not present

## 2021-04-07 ENCOUNTER — Telehealth: Payer: Self-pay | Admitting: Physical Medicine and Rehabilitation

## 2021-04-07 NOTE — Telephone Encounter (Signed)
Pt called stating she got an injection with another Dr. And would like to cancel her 04/18/21 appt.

## 2021-04-12 NOTE — Telephone Encounter (Signed)
Appointment cancelled

## 2021-04-18 ENCOUNTER — Ambulatory Visit: Payer: Medicare PPO | Admitting: Physical Medicine and Rehabilitation

## 2021-04-26 DIAGNOSIS — M5416 Radiculopathy, lumbar region: Secondary | ICD-10-CM | POA: Diagnosis not present

## 2021-04-26 DIAGNOSIS — G959 Disease of spinal cord, unspecified: Secondary | ICD-10-CM | POA: Diagnosis not present

## 2021-04-27 ENCOUNTER — Other Ambulatory Visit: Payer: Self-pay | Admitting: Internal Medicine

## 2021-04-27 ENCOUNTER — Other Ambulatory Visit (HOSPITAL_BASED_OUTPATIENT_CLINIC_OR_DEPARTMENT_OTHER): Payer: Self-pay | Admitting: Internal Medicine

## 2021-04-27 DIAGNOSIS — M5116 Intervertebral disc disorders with radiculopathy, lumbar region: Secondary | ICD-10-CM

## 2021-04-27 DIAGNOSIS — G959 Disease of spinal cord, unspecified: Secondary | ICD-10-CM

## 2021-04-28 ENCOUNTER — Encounter: Payer: Self-pay | Admitting: Orthopaedic Surgery

## 2021-05-04 ENCOUNTER — Ambulatory Visit: Payer: Medicare PPO | Admitting: Orthopaedic Surgery

## 2021-05-09 DIAGNOSIS — E538 Deficiency of other specified B group vitamins: Secondary | ICD-10-CM | POA: Diagnosis not present

## 2021-05-09 DIAGNOSIS — E782 Mixed hyperlipidemia: Secondary | ICD-10-CM | POA: Diagnosis not present

## 2021-05-11 ENCOUNTER — Ambulatory Visit
Admission: RE | Admit: 2021-05-11 | Discharge: 2021-05-11 | Disposition: A | Discharge status: Home / Self Care | Payer: Medicare PPO | Source: Ambulatory Visit | Attending: Internal Medicine | Admitting: Internal Medicine

## 2021-05-11 ENCOUNTER — Other Ambulatory Visit: Payer: Self-pay

## 2021-05-11 DIAGNOSIS — M5116 Intervertebral disc disorders with radiculopathy, lumbar region: Secondary | ICD-10-CM | POA: Diagnosis not present

## 2021-05-11 DIAGNOSIS — M47816 Spondylosis without myelopathy or radiculopathy, lumbar region: Secondary | ICD-10-CM | POA: Diagnosis not present

## 2021-05-11 DIAGNOSIS — G959 Disease of spinal cord, unspecified: Secondary | ICD-10-CM | POA: Insufficient documentation

## 2021-05-16 DIAGNOSIS — I480 Paroxysmal atrial fibrillation: Secondary | ICD-10-CM | POA: Diagnosis not present

## 2021-05-16 DIAGNOSIS — M5116 Intervertebral disc disorders with radiculopathy, lumbar region: Secondary | ICD-10-CM | POA: Diagnosis not present

## 2021-05-16 DIAGNOSIS — Z0000 Encounter for general adult medical examination without abnormal findings: Secondary | ICD-10-CM | POA: Diagnosis not present

## 2021-07-17 ENCOUNTER — Other Ambulatory Visit: Payer: Self-pay | Admitting: Cardiovascular Disease

## 2021-07-25 ENCOUNTER — Other Ambulatory Visit: Payer: Self-pay | Admitting: Internal Medicine

## 2021-07-25 DIAGNOSIS — G459 Transient cerebral ischemic attack, unspecified: Secondary | ICD-10-CM

## 2021-07-31 ENCOUNTER — Other Ambulatory Visit: Payer: Self-pay | Admitting: Cardiovascular Disease

## 2021-08-01 NOTE — Telephone Encounter (Signed)
Refill request

## 2021-08-01 NOTE — Telephone Encounter (Signed)
Prescription refill request for Eliquis received. Indication: afib  Last office visit: Gilford Rile, 10/20/2020 Scr: 0.8, 05/09/2021 Age: 76 yo  Weight: 108.6 kg     Refill sent.

## 2021-08-03 ENCOUNTER — Ambulatory Visit: Payer: Medicare PPO

## 2021-08-11 ENCOUNTER — Ambulatory Visit
Admission: RE | Admit: 2021-08-11 | Discharge: 2021-08-11 | Disposition: A | Discharge status: Home / Self Care | Payer: Medicare PPO | Source: Ambulatory Visit | Attending: Internal Medicine | Admitting: Internal Medicine

## 2021-08-11 ENCOUNTER — Other Ambulatory Visit: Payer: Self-pay

## 2021-08-11 DIAGNOSIS — G459 Transient cerebral ischemic attack, unspecified: Secondary | ICD-10-CM | POA: Insufficient documentation

## 2021-09-19 ENCOUNTER — Other Ambulatory Visit: Payer: Self-pay | Admitting: Internal Medicine

## 2021-09-19 DIAGNOSIS — Z1231 Encounter for screening mammogram for malignant neoplasm of breast: Secondary | ICD-10-CM

## 2021-09-27 ENCOUNTER — Ambulatory Visit
Admission: RE | Admit: 2021-09-27 | Discharge: 2021-09-27 | Disposition: A | Discharge status: Home / Self Care | Payer: Medicare PPO | Source: Ambulatory Visit | Attending: Internal Medicine | Admitting: Internal Medicine

## 2021-09-27 DIAGNOSIS — Z1231 Encounter for screening mammogram for malignant neoplasm of breast: Secondary | ICD-10-CM | POA: Diagnosis present

## 2021-11-13 ENCOUNTER — Other Ambulatory Visit: Payer: Self-pay | Admitting: Family

## 2021-11-13 DIAGNOSIS — R600 Localized edema: Secondary | ICD-10-CM

## 2021-11-13 DIAGNOSIS — I5189 Other ill-defined heart diseases: Secondary | ICD-10-CM

## 2021-12-13 ENCOUNTER — Other Ambulatory Visit: Payer: Self-pay | Admitting: Internal Medicine

## 2021-12-13 DIAGNOSIS — S32010A Wedge compression fracture of first lumbar vertebra, initial encounter for closed fracture: Secondary | ICD-10-CM

## 2021-12-14 ENCOUNTER — Ambulatory Visit
Admission: RE | Admit: 2021-12-14 | Discharge: 2021-12-14 | Disposition: A | Discharge status: Home / Self Care | Payer: Medicare PPO | Source: Ambulatory Visit | Attending: Internal Medicine | Admitting: Internal Medicine

## 2021-12-14 DIAGNOSIS — S32010A Wedge compression fracture of first lumbar vertebra, initial encounter for closed fracture: Secondary | ICD-10-CM

## 2021-12-15 ENCOUNTER — Other Ambulatory Visit: Payer: Self-pay | Admitting: Internal Medicine

## 2021-12-15 ENCOUNTER — Other Ambulatory Visit: Payer: Self-pay | Admitting: Cardiovascular Disease

## 2021-12-15 ENCOUNTER — Ambulatory Visit
Admission: RE | Admit: 2021-12-15 | Discharge: 2021-12-15 | Disposition: A | Discharge status: Home / Self Care | Payer: Medicare PPO | Source: Ambulatory Visit | Attending: Internal Medicine | Admitting: Internal Medicine

## 2021-12-15 DIAGNOSIS — S32010A Wedge compression fracture of first lumbar vertebra, initial encounter for closed fracture: Secondary | ICD-10-CM

## 2021-12-15 NOTE — H&P (Signed)
Interventional Radiology - Clinic Visit, Initial H&P    Referring Provider: Rusty Aus, MD  Reason for Visit: L1 compression fracture     History of Present Illness  Veronica Stanley is a 76 y.o. female with a relevant past medical history of osteoporosis and atrial fibrillation on Eliquis seen today in Interventional Radiology clinic for new L1 compression fracture.  Patient reports injuring her back after pulling a heavy rug across her house approximally 3 weeks prior.  She initially went to her PCP and was managed conservatively with prescription pain medication (tramadol).  The pain medication did help her pain initially, then had to stop it due to sleepiness and constipation. MRI yesterday 06/28 demonstrates acute fracture of the L1 body with 40% height loss. She rates her pain currently 5/10.  It does cause her significant disability as demonstrated by 19/24 positive on the [employer] disability questionnaire.  It particularly affects her ability to get around the house and perform her daily normal routine.    Review of systems positive for osteoporosis.  Negative for any pulmonary disease or shortness of breath.  She does report a history of atrial fibrillation for which he takes Eliquis.  No other cardiac issues.  Denies current chest pain or shortness of breath.    Additional Past Medical History Past Medical History:  Diagnosis Date   Basal cell carcinoma, arm 11/13   on arm and face    Diverticulitis    colon   GERD (gastroesophageal reflux disease)    Hypertension    patient denies not on meds    Menopausal symptoms    Osteoarthrosis involving, or with mention of more than one site, but not specified as generalized, multiple sites    Osteoporosis    Peripheral vascular disease [facility])      Surgical History  Past Surgical History:  Procedure Laterality Date   ABDOMINAL HYSTERECTOMY     BREAST BIOPSY Left 2003   Left breast calcifications benign   COLONOSCOPY WITH  PROPOFOL N/A 04/22/2018   Procedure: COLONOSCOPY WITH PROPOFOL;  Surgeon: Veronica Silvas, MD;  Location: [facility] ENDOSCOPY;  Service: Endoscopy;  Laterality: N/A;   DILATION AND CURETTAGE OF UTERUS     rupture- peritonitis & hysterectomy 12/79   TOTAL HIP ARTHROPLASTY Left 10/19/2017   Procedure: LEFT TOTAL HIP ARTHROPLASTY ANTERIOR APPROACH;  Surgeon: Veronica Rossetti, MD;  Location: [facility] ORS;  Service: Orthopedics;  Laterality: Left;   VAGINAL DELIVERY     x2     Medications  I have reviewed the current medication list. Refer to chart for details. Current Outpatient Medications  Medication Instructions   apixaban (ELIQUIS) 5 MG TABS tablet TAKE 1 TABLET BY MOUTH TWICE A DAY   COLLAGEN PO Oral   diltiazem (CARDIZEM) 60 mg, Oral, Daily PRN, Take for elevated heart rate greater than 100   furosemide (LASIX) 20 MG tablet TAKE 1 TABLET BY MOUTH EVERY DAY   gabapentin (NEURONTIN) 100 mg, Daily at bedtime   Glucosamine HCl (GLUCOSAMINE PO) 2 capsules, Every morning   ibuprofen (ADVIL) 400 mg, Daily PRN   metoprolol succinate (TOPROL-XL) 100 mg, Oral, Daily, Take with or immediately following a meal.   Multiple Vitamin (MULTIVITAMIN) tablet 1 tablet, Daily   potassium chloride (KLOR-CON) 10 MEQ tablet 10 mEq, Oral, Daily   rosuvastatin (CRESTOR) 5 mg, Oral, Daily   Vitamin D3 1,000 Units, Daily   Zinc 50 mg, Daily      Allergies Allergies  Allergen Reactions  Penicillins Anaphylaxis, Shortness Of Breath, Swelling and Rash    Has patient had a PCN reaction causing immediate rash, facial/tongue/throat swelling, SOB or lightheadedness with hypotension: Yes Has patient had a PCN reaction causing severe rash involving mucus membranes or skin necrosis: Yes Has patient had a PCN reaction that required hospitalization: No Has patient had a PCN reaction occurring within the last 10 years: No If all of the above answers are "NO", then may proceed with Cephalosporin use.    Ibandronate  Sodium Other (See Comments)    aches   Does patient have contrast allergy: No     Physical Exam Current Vitals   ( )                    There is no height or weight on file to calculate BMI.  General: Alert and answers questions appropriately. No apparent distress. HEENT: Normocephalic, atraumatic. Conjunctivae normal without scleral icterus. Cardiac: Regular rate and rhythm. No dependent edema. Pulmonary: Normal work of breathing. On room air. Abdominal: Soft without distension. MSK: Point tenderness overlying the upper lumbar spine    Pertinent Lab Results    Latest Ref Rng & Units 04/12/2020   11:41 AM 02/10/2020    9:32 AM 12/11/2018    9:36 AM  CBC  WBC 4.0 - 10.5 K/uL 6.5  6.0  5.8   Hemoglobin 12.0 - 15.0 g/dL 14.2  13.3  12.6   Hematocrit 36.0 - 46.0 % 45.6  39.8  38.5   Platelets 150 - 400 K/uL 279  253.0  252.0       Latest Ref Rng & Units 09/28/2020    9:01 AM 04/12/2020   11:41 AM 02/10/2020    9:32 AM  CMP  Glucose 70 - 99 mg/dL 102  103  95   BUN 8 - 23 mg/dL '17  18  16   '$ Creatinine 0.44 - 1.00 mg/dL 0.80  0.77  0.75   Sodium 135 - 145 mmol/L 138  140  138   Potassium 3.5 - 5.1 mmol/L 4.0  4.5  4.0   Chloride 98 - 111 mmol/L 105  104  103   CO2 22 - 32 mmol/L '24  27  27   '$ Calcium 8.9 - 10.3 mg/dL 8.9  9.3  9.2   Total Protein 6.0 - 8.3 g/dL   7.1   Total Bilirubin 0.2 - 1.2 mg/dL   0.5   Alkaline Phos 39 - 117 U/L   69   AST 0 - 37 U/L   15   ALT 0 - 35 U/L   9       Relevant and/or Recent Imaging: MRI L spine reviewed as per HPI    Assessment & Plan Veronica Stanley is a 76 y.o. female with a history of osteoporosis, atrial fibrillation on Eliquis who was referred to [facility] in consultation for further evaluation and management of L1 compression fracture.  Assessment & Plan:   Patient has suffered acute osteoporotic fracture of the L1 vertebra.   History and exam have demonstrated the following:  Acute/Subacute fracture by imaging  dated 12/14/2021, Pain on exam concordant with level of fracture, Inability to tolerate narcotic pain medication due to side effects, and Significant disability on the [place] with 19/24 positive symptoms, reflecting significant impact/impairment of (ADLs)   ICD-10-CM Codes that Support Medical Necessity ([email or web address])  M80.08XA    Age-related osteoporosis with current pathological fracture, vertebra(e), initial encounter for fracture  Plan:  L1 vertebral body augmentation with balloon kyphoplasty  Post-procedure disposition: outpatient [facility]   Medication holds: Eliquis 48 hours pending clearance   The patient has suffered a fracture of the L1 vertebral body. It is recommended that patients aged 46 years or older be evaluated for possible testing or treatment of osteoporosis. A copy of this consult report is sent to the patient's referring physician.  Advanced Care Plan: The patient did not want to provide an [place] at the time of this visit     Total time spent on today's visit was over  60 Minutes, including both face-to-face time and non face-to-face time, personally spent on review of chart (including labs and relevant imaging), discussing further workup and treatment options, referral to specialist if needed, reviewing outside records if pertinent, answering patient questions, and coordinating care regarding L1 compression fracture as well as management strategy.     Albin Felling, MD  Vascular and Interventional Radiology 12/15/2021 3:35 PM

## 2021-12-16 ENCOUNTER — Telehealth: Payer: Self-pay | Admitting: Cardiovascular Disease

## 2021-12-16 DIAGNOSIS — R931 Abnormal findings on diagnostic imaging of heart and coronary circulation: Secondary | ICD-10-CM

## 2021-12-16 NOTE — Telephone Encounter (Signed)
   Pre-operative Risk Assessment    Patient Name: Veronica Stanley  DOB: 09/22/1945 MRN: [id number]      Request for Surgical Clearance    Procedure:   Kyphoplasty  Date of Surgery:  Clearance TBD                                 Surgeon:  [facility]  Surgeon's Group or Practice Name:  [facility]  Phone number:  [phone] Fax number:  [phone]    Type of Clearance Requested:   - Medical  - Pharmacy:  Hold Apixaban (Eliquis) 2 days    Type of Anesthesia:  Not Indicated   Additional requests/questions:    Jonathon Jordan   12/16/2021, 1:04 PM

## 2021-12-21 ENCOUNTER — Other Ambulatory Visit: Payer: Self-pay | Admitting: Cardiovascular Disease

## 2021-12-21 ENCOUNTER — Telehealth: Payer: Self-pay | Admitting: *Deleted

## 2021-12-21 DIAGNOSIS — R931 Abnormal findings on diagnostic imaging of heart and coronary circulation: Secondary | ICD-10-CM | POA: Insufficient documentation

## 2021-12-21 NOTE — Telephone Encounter (Signed)
Pt agreeable to plan of care for televisit pre op 12/27/21 @ 11 am. Med rec and consent are done.     Patient Consent for Virtual Visit        Veronica Stanley has provided verbal consent on 12/21/2021 for a virtual visit (video or telephone).   CONSENT FOR VIRTUAL VISIT FOR:  Veronica Stanley  By participating in this virtual visit I agree to the following:  I hereby voluntarily request, consent and authorize [facility] and its employed or contracted physicians, physician assistants, nurse practitioners or other licensed health care professionals (the Practitioner), to provide me with telemedicine health care services (the "Services") as deemed necessary by the treating Practitioner. I acknowledge and consent to receive the Services by the Practitioner via telemedicine. I understand that the telemedicine visit will involve communicating with the Practitioner through live audiovisual communication technology and the disclosure of certain medical information by electronic transmission. I acknowledge that I have been given the opportunity to request an in-person assessment or other available alternative prior to the telemedicine visit and am voluntarily participating in the telemedicine visit.  I understand that I have the right to withhold or withdraw my consent to the use of telemedicine in the course of my care at any time, without affecting my right to future care or treatment, and that the Practitioner or I may terminate the telemedicine visit at any time. I understand that I have the right to inspect all information obtained and/or recorded in the course of the telemedicine visit and may receive copies of available information for a reasonable fee.  I understand that some of the potential risks of receiving the Services via telemedicine include:  Delay or interruption in medical evaluation due to technological equipment failure or disruption; Information transmitted may not be sufficient (e.g. poor  resolution of images) to allow for appropriate medical decision making by the Practitioner; and/or  In rare instances, security protocols could fail, causing a breach of personal health information.  Furthermore, I acknowledge that it is my responsibility to provide information about my medical history, conditions and care that is complete and accurate to the best of my ability. I acknowledge that Practitioner's advice, recommendations, and/or decision may be based on factors not within their control, such as incomplete or inaccurate data provided by me or distortions of diagnostic images or specimens that may result from electronic transmissions. I understand that the practice of medicine is not an exact science and that Practitioner makes no warranties or guarantees regarding treatment outcomes. I acknowledge that a copy of this consent can be made available to me via my patient portal ([facility]), or I can request a printed copy by calling the office of [facility].    I understand that my insurance will be billed for this visit.   I have read or had this consent read to me. I understand the contents of this consent, which adequately explains the benefits and risks of the Services being provided via telemedicine.  I have been provided ample opportunity to ask questions regarding this consent and the Services and have had my questions answered to my satisfaction. I give my informed consent for the services to be provided through the use of telemedicine in my medical care

## 2021-12-21 NOTE — Telephone Encounter (Signed)
Juliann Pulse from [facility], called asking on an update about Clearance so they can get the pt in asap. Informed her that we are awaiting a response about eliquis.

## 2021-12-21 NOTE — Telephone Encounter (Signed)
Pt agreeable to plan of care for televisit pre op 12/27/21 @ 11 am. Med rec and consent are done.

## 2021-12-21 NOTE — Telephone Encounter (Signed)
Patient with diagnosis of afib on Eliquis for anticoagulation.    Procedure: kyphoplasty Date of procedure: TBD  CHA2DS2-VASc Score = 5  This indicates a 7.2% annual risk of stroke. The patient's score is based upon: CHF History: 0 HTN History: 1 Diabetes History: 0 Stroke History: 0 Vascular Disease History: 1 Age Score: 2 Gender Score: 1   CrCl 41m/min using adjusted body weight due to obesity Platelet count 231K  Per office protocol, patient can hold Eliquis for 3 days prior to procedure.    **This guidance is not considered finalized until pre-operative APP has relayed final recommendations.**

## 2021-12-23 ENCOUNTER — Other Ambulatory Visit: Payer: Self-pay | Admitting: Cardiovascular Disease

## 2021-12-27 ENCOUNTER — Encounter: Payer: Self-pay | Admitting: Nurse Practitioner

## 2021-12-27 ENCOUNTER — Ambulatory Visit (INDEPENDENT_AMBULATORY_CARE_PROVIDER_SITE_OTHER): Payer: Medicare PPO | Admitting: Nurse Practitioner

## 2021-12-27 DIAGNOSIS — Z01810 Encounter for preprocedural cardiovascular examination: Secondary | ICD-10-CM | POA: Diagnosis not present

## 2021-12-27 NOTE — Progress Notes (Signed)
Virtual Visit via Telephone Note   Because of Veronica Stanley's co-morbid illnesses, she is at least at moderate risk for complications without adequate follow up.  This format is felt to be most appropriate for this patient at this time.  The patient did not have access to video technology/had technical difficulties with video requiring transitioning to audio format only (telephone).  All issues noted in this document were discussed and addressed.  No physical exam could be performed with this format.  Please refer to the patient's chart for her consent to telehealth for [facility].  Evaluation Performed:  Preoperative cardiovascular risk assessment _____________   Date:  12/27/2021   Patient ID:  Veronica Stanley, DOB Jun 11, 1946, MRN [id number] Patient Location:  Home Provider location:   Office  Primary Care Provider:  Rusty Aus, MD Primary Cardiologist:  Ida Rogue, MD  Chief Complaint / Patient Profile   76 y.o. y/o female with a h/o PAF on chronic anticoagulation, hypertension, OSA on CPAP, obesity, elevated coronary calcium score, leg edema who is pending kyphoplasty and presents today for telephonic preoperative cardiovascular risk assessment.  Past Medical History    Past Medical History:  Diagnosis Date   Basal cell carcinoma, arm 11/13   on arm and face    Diverticulitis    colon   GERD (gastroesophageal reflux disease)    Hypertension    patient denies not on meds    Menopausal symptoms    Osteoarthrosis involving, or with mention of more than one site, but not specified as generalized, multiple sites    Osteoporosis    Peripheral vascular disease ([place])    Past Surgical History:  Procedure Laterality Date   ABDOMINAL HYSTERECTOMY     BREAST BIOPSY Left 2003   Left breast calcifications benign   COLONOSCOPY WITH PROPOFOL N/A 04/22/2018   Procedure: COLONOSCOPY WITH PROPOFOL;  Surgeon: Manya Silvas, MD;  Location: [facility] ENDOSCOPY;  Service: Endoscopy;   Laterality: N/A;   DILATION AND CURETTAGE OF UTERUS     rupture- peritonitis & hysterectomy 12/79   TOTAL HIP ARTHROPLASTY Left 10/19/2017   Procedure: LEFT TOTAL HIP ARTHROPLASTY ANTERIOR APPROACH;  Surgeon: Mcarthur Rossetti, MD;  Location: [facility];  Service: Orthopedics;  Laterality: Left;   VAGINAL DELIVERY     x2    Allergies  Allergies  Allergen Reactions   Penicillins Anaphylaxis, Shortness Of Breath, Swelling and Rash    Has patient had a PCN reaction causing immediate rash, facial/tongue/throat swelling, SOB or lightheadedness with hypotension: Yes Has patient had a PCN reaction causing severe rash involving mucus membranes or skin necrosis: Yes Has patient had a PCN reaction that required hospitalization: No Has patient had a PCN reaction occurring within the last 10 years: No If all of the above answers are "NO", then may proceed with Cephalosporin use.    Ibandronate Sodium Other (See Comments)    aches    History of Present Illness    Veronica Stanley is a 76 y.o. female who presents via audio/video conferencing for a telehealth visit today.  Pt was last seen in cardiology clinic on 02/01/21 by Dr. Rockey Situ.  At that time Veronica Stanley was doing well. The patient is now pending procedure as outlined above. Since her last visit, she denies chest pain, shortness of breath, lower extremity edema, fatigue, palpitations, melena, hematuria, hemoptysis, diaphoresis, weakness, presyncope, syncope, orthopnea, and PND. Has been very limited by back pain but remains as active as she can  with housework.   Home Medications    Prior to Admission medications   Medication Sig Start Date End Date Taking? Authorizing Provider  apixaban (ELIQUIS) 5 MG TABS tablet TAKE 1 TABLET BY MOUTH TWICE A DAY 08/01/21   Minna Merritts, MD  B Complex Vitamins (VITAMIN B-COMPLEX) TABS  09/21/21   [provider]  Cholecalciferol (VITAMIN D3) 1000 units CAPS Take 1,000 Units by mouth daily.   Patient not taking: Reported on 12/15/2021    [provider]  COLLAGEN PO Take by mouth.    [provider]  Cyanocobalamin 5000 MCG CAPS Place under the tongue. Patient not taking: Reported on 12/21/2021    [provider]  diltiazem (CARDIZEM) 60 MG tablet Take 1 tablet (60 mg total) by mouth daily as needed. Take for elevated heart rate greater than 100 05/10/20   Gollan, Kathlene November, MD  furosemide (LASIX) 20 MG tablet TAKE 1 TABLET BY MOUTH EVERY DAY Patient taking differently: Take 20 mg by mouth as needed. 11/15/21   Minna Merritts, MD  gabapentin (NEURONTIN) 100 MG capsule Take 100 mg by mouth at bedtime. Patient not taking: Reported on 12/15/2021 11/11/20   [provider]  Glucosamine HCl (GLUCOSAMINE PO) Take 2 capsules by mouth in the morning. Patient not taking: Reported on 12/15/2021    [provider]  ibuprofen (ADVIL,MOTRIN) 200 MG tablet Take 400 mg by mouth daily as needed for headache or mild pain.  Patient not taking: Reported on 12/15/2021    [provider]  methocarbamol (ROBAXIN) 500 MG tablet Take 500 mg by mouth 2 (two) times daily as needed. 11/28/21   [provider]  metoprolol succinate (TOPROL-XL) 100 MG 24 hr tablet Take 1 tablet (100 mg total) by mouth daily. Take with or immediately following a meal. 02/01/21   Gollan, Kathlene November, MD  Multiple Vitamin (MULTIVITAMIN) tablet Take 1 tablet by mouth daily. Patient not taking: Reported on 12/15/2021    [provider]  pantoprazole (PROTONIX) 40 MG tablet Take by mouth. Patient not taking: Reported on 12/21/2021 11/21/21 11/21/22  [provider]  pantoprazole (PROTONIX) 40 MG tablet Take 40 mg by mouth daily. 10/14/21   [provider]  potassium chloride (KLOR-CON) 10 MEQ tablet Take 1 tablet (10 mEq total) by mouth daily. Patient taking differently: Take 10 mEq by mouth as needed. 10/20/20   Loel Dubonnet, NP  rosuvastatin (CRESTOR) 5  MG tablet TAKE 1 TABLET (5 MG TOTAL) BY MOUTH DAILY. 07/18/21   Minna Merritts, MD  traMADol (ULTRAM) 50 MG tablet Take 1 tablet by mouth 3 (three) times daily. 12/08/21   [provider]  Zinc 50 MG TABS Take 50 mg by mouth daily.  Patient not taking: Reported on 12/15/2021    [provider]    Physical Exam    Vital Signs:  Veronica Stanley does not have vital signs available for review today.  Given telephonic nature of communication, physical exam is limited. AAOx3. NAD. Normal affect.  Speech and respirations are unlabored.  Accessory Clinical Findings    None  Assessment & Plan    1.  Preoperative Cardiovascular Risk Assessment: Patient is doing well from a cardiac perspective and may proceed to surgery without further testing. According to the Revised Cardiac Risk Index (RCRI), her Perioperative Risk of Major Cardiac Event is (%): 0.4 Her Functional Capacity in METs is: 5.07 according to the Duke Activity Status Index (DASI). Per office protocol, patient can  hold Eliquis for 3 days prior to procedure.  We discussed preop finding of HR 51 bpm. This is consistent with recent readings, however she would like to discuss possibly lowering metoprolol dose at office visit scheduled for 02/16/22 with Ignacia Bayley, NP.    A copy of this note will be routed to requesting surgeon.  Time:   Today, I have spent 10 minutes with the patient with telehealth technology discussing medical history, symptoms, and management plan.     Emmaline Life, NP-C    12/27/2021, 11:08 AM [facility] [address] Office [phone] Fax [phone]

## 2022-01-03 ENCOUNTER — Ambulatory Visit
Admission: RE | Admit: 2022-01-03 | Discharge: 2022-01-03 | Disposition: A | Discharge status: Home / Self Care | Payer: Medicare PPO | Source: Ambulatory Visit | Attending: Internal Medicine | Admitting: Internal Medicine

## 2022-01-03 DIAGNOSIS — S32010A Wedge compression fracture of first lumbar vertebra, initial encounter for closed fracture: Secondary | ICD-10-CM

## 2022-01-03 HISTORY — PX: IR KYPHO LUMBAR INC FX REDUCE BONE BX UNI/BIL CANNULATION INC/IMAGING: IMG5519

## 2022-01-03 MED ORDER — VANCOMYCIN HCL 1500 MG/300ML IV SOLN
1500.0000 mg | INTRAVENOUS | Status: AC
  Administered 2022-01-03: 1500 mg via INTRAVENOUS

## 2022-01-03 MED ORDER — SODIUM CHLORIDE 0.9 % IV SOLN: INTRAVENOUS | Status: DC

## 2022-01-03 MED ORDER — MIDAZOLAM HCL 2 MG/2ML IJ SOLN
1.0000 mg | INTRAMUSCULAR | Status: DC | PRN
  Administered 2022-01-03 (×4): 1 mg via INTRAVENOUS

## 2022-01-03 MED ORDER — ACETAMINOPHEN 10 MG/ML IV SOLN
1000.0000 mg | Freq: Once | INTRAVENOUS | Status: AC
  Administered 2022-01-03: 1000 mg via INTRAVENOUS

## 2022-01-03 MED ORDER — FENTANYL CITRATE PF 50 MCG/ML IJ SOSY
25.0000 ug | PREFILLED_SYRINGE | INTRAMUSCULAR | Status: DC | PRN
  Administered 2022-01-03 (×2): 25 ug via INTRAVENOUS
  Administered 2022-01-03 (×3): 50 ug via INTRAVENOUS

## 2022-01-03 NOTE — Progress Notes (Signed)
Pt back in nursing recovery area. Pt still drowsy from procedure but will wake up when spoken to. Pt follows commands, talks in complete sentences and has no complaints at this time. Pt will remain in nursing station until discharge.  ?

## 2022-01-03 NOTE — Discharge Instructions (Addendum)
Kyphoplasty Post Procedure Discharge Instructions  May resume a regular diet and any medications that you routinely take (including pain medications). However, if you are taking Aspirin or an anticoagulant/blood thinner you will be told when you can resume taking these by the healthcare provider. No driving day of procedure. The day of your procedure take it easy. You may use an ice pack as needed to injection sites on back.  Ice to back 30 minutes on and 30 minutes off, as needed. May remove bandaids tomorrow after taking a shower. Replace daily with a clean bandaid until healed.  Do not lift anything heavier than a milk jug for 1-2 weeks or determined by your physician.  Follow up with your physician in 2 weeks.    Please contact our office at [phone] [facility]) or [phone] ([place]) for the following symptoms or if you have any questions:  Fever greater than 100 degrees Increased swelling, pain, or redness at injection site. Increased back and/or leg pain New numbness or change in symptoms from before the procedure.    Thank you for visiting [facility].   You may resume your Eliquis in 24hrs (01/04/2022 PM dose).

## 2022-01-09 ENCOUNTER — Telehealth: Payer: Self-pay

## 2022-01-09 ENCOUNTER — Other Ambulatory Visit: Payer: Self-pay | Admitting: Interventional Radiology

## 2022-01-09 DIAGNOSIS — Z712 Person consulting for explanation of examination or test findings: Secondary | ICD-10-CM

## 2022-01-09 NOTE — Progress Notes (Signed)
Phone call to pt to follow up from her kyphoplasty on 01/03/22. Pt reports her pain is completely gone post procedure but is still having "a little soreness". Pt reports she is able to move around a little better. Pt denies any signs of infection, redness at the site, draining or fever. Pt has no complaints at this time and will be scheduled for a telephone follow up with Dr. Denna Haggard next week. Pt advised to call back if anything were to change or any concerns arise and we will arrange an in person appointment. Pt verbalized understanding.

## 2022-01-10 DIAGNOSIS — S32010A Wedge compression fracture of first lumbar vertebra, initial encounter for closed fracture: Secondary | ICD-10-CM | POA: Insufficient documentation

## 2022-01-11 ENCOUNTER — Other Ambulatory Visit: Payer: Self-pay | Admitting: Cardiovascular Disease

## 2022-01-26 ENCOUNTER — Ambulatory Visit
Admission: RE | Admit: 2022-01-26 | Discharge: 2022-01-26 | Disposition: A | Discharge status: Home / Self Care | Payer: Medicare PPO | Source: Ambulatory Visit | Attending: Interventional Radiology | Admitting: Interventional Radiology

## 2022-01-26 DIAGNOSIS — Z712 Person consulting for explanation of examination or test findings: Secondary | ICD-10-CM

## 2022-01-26 HISTORY — PX: IR RADIOLOGIST EVAL & MGMT: IMG5224

## 2022-01-26 NOTE — Progress Notes (Signed)
Interventional Radiology - Telephone Visit    History of Present Illness  We confirmed identity with 2 personal identifiers.   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  She reports that her pain is significantly improved following the procedure, no longer complains of any sharp pain in the lower back.  She is now working again 2.5 days per week, and overall feels good about her back.  Her main complaint today is that she feels that she runs out of stamina and gets weak/fatigued earlier than she used to.  She complains of some dull ache in her shoulders and lower back at times.  She notes that sometimes she feels sweaty.  She did note that she had been bed-bound to a recliner for almost 2 months prior to kyphoplasty procedure.    Of note, she has a follow-up appointment with her cardiologist within the next month.     Past medical and surgical history reviewed. No interval changes. No interval hospitalizations.   Medications  I have reviewed the current medication list. Refer to chart for details. Current Outpatient Medications  Medication Instructions   apixaban (ELIQUIS) 5 MG TABS tablet TAKE 1 TABLET BY MOUTH TWICE A DAY   B Complex Vitamins (VITAMIN B-COMPLEX) TABS No dose, route, or frequency recorded.   COLLAGEN PO Oral   Cyanocobalamin 5000 MCG CAPS Place under the tongue.   diltiazem (CARDIZEM) 60 mg, Oral, Daily PRN, Take for elevated heart rate greater than 100   furosemide (LASIX) 20 MG tablet TAKE 1 TABLET BY MOUTH EVERY DAY   gabapentin (NEURONTIN) 100 mg, Daily at bedtime   Glucosamine HCl (GLUCOSAMINE PO) 2 capsules, Every morning   ibuprofen (ADVIL) 400 mg, Daily PRN   methocarbamol (ROBAXIN) 500 mg, Oral, 2 times daily PRN   metoprolol succinate (TOPROL-XL) 100 mg, Oral, Daily, Take with or immediately following a meal.   Multiple Vitamin (MULTIVITAMIN)  tablet 1 tablet, Daily   pantoprazole (PROTONIX) 40 MG tablet Take by mouth.   pantoprazole (PROTONIX) 40 mg, Oral, Daily   potassium chloride (KLOR-CON) 10 MEQ tablet 10 mEq, Oral, Daily   rosuvastatin (CRESTOR) 5 mg, Oral, Daily   traMADol (ULTRAM) 50 MG tablet 1 tablet, Oral, 3 times daily   Vitamin D3 1,000 Units, Daily   Zinc 50 mg, Daily       Pertinent Lab Results    Latest Ref Rng & Units 04/12/2020   11:41 AM 02/10/2020    9:32 AM 12/11/2018    9:36 AM  CBC  WBC 4.0 - 10.5 K/uL 6.5  6.0  5.8   Hemoglobin 12.0 - 15.0 g/dL 14.2  13.3  12.6   Hematocrit 36.0 - 46.0 % 45.6  39.8  38.5   Platelets 150 - 400 K/uL 279  253.0  252.0       Latest Ref Rng & Units 09/28/2020    9:01 AM 04/12/2020   11:41 AM 02/10/2020    9:32 AM  CMP  Glucose 70 - 99 mg/dL 102  103  95   BUN 8 - 23 mg/dL '17  18  16   '$ Creatinine 0.44 - 1.00 mg/dL 0.80  0.77  0.75   Sodium 135 - 145 mmol/L 138  140  138   Potassium 3.5 - 5.1 mmol/L 4.0  4.5  4.0   Chloride 98 - 111 mmol/L 105  104  103   CO2 22 - 32  mmol/L '24  27  27   '$ Calcium 8.9 - 10.3 mg/dL 8.9  9.3  9.2   Total Protein 6.0 - 8.3 g/dL   7.1   Total Bilirubin 0.2 - 1.2 mg/dL   0.5   Alkaline Phos 39 - 117 U/L   69   AST 0 - 37 U/L   15   ALT 0 - 35 U/L   9      Relevant and/or Recent Imaging: No new interval imaging    Assessment & Plan Veronica Stanley is status post vertebral body augmentation using balloon kyphoplasty at L1 on January 03, 2022 following a 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   Lower back pain is much improved after the procedure, and she is able to return to almost normal function. She does complain of some early fatigue. Likely component of deconditioning given lack of activity for almost 2 months prior to procedure. She does have follow up with her cardiologist within the next month, which I agree with to rule out any cardiac issues.    Plan:  1. Follow up with me PRN     Total time spent on today's  visit was over 15 minutes, including both face-to-face time and non face-to-face time, personally spent on review of chart (including labs and relevant imaging), discussing further workup and treatment options, referral to specialist if needed, reviewing outside records if pertinent, answering patient questions, and coordinating care regarding L1 fracture as well as management strategy.     Visit type: Audio only (telephone). Audio (no video) only due to patient's lack of internet/smartphone capability. Alternative for in-person consultation at [facility], [address], [place], [place]. This visit type was conducted due to national recommendations for restrictions regarding the COVID-19 Pandemic (e.g. social distancing).  This format is felt to be most appropriate for this patient at this time.  All issues noted in this document were discussed and addressed.      Veronica Felling, MD  Vascular and Interventional Radiology 01/26/2022 3:15 PM

## 2022-02-15 NOTE — Progress Notes (Unsigned)
Cardiology Office Note:    Date:  02/16/2022   ID:  BRUNETTA NEWINGHAM, DOB [birth year], MRN [id number]  PCP:  Rusty Aus, MD   [facility] Cardiologist:  Ida Rogue, MD     Referring MD: Rusty Aus, MD   CC: Recent A-fib with RVR, 1 week ago   History of Present Illness:    Veronica Stanley is a 76 y.o. female with a hx of the following:   HTN PAF  OSA  GERD Morbid obesity Elevated coronary calcium score Leg edema   Was last seen via telehealth visit on 12/27/2021 by Christen Bame for preop clearance for kyphoplasty. At that time, she was reported as doing well from a cardiac perspective and was cleared for surgery. She was okay to hold Eliquis for 3 days prior to her procedure. HR was found to be 51 bpm and had reported consistent readings of HR in 50's. Stated at the time she would like to come back to discuss possibly lowering the dose of Metoprolol at the next scheduled office visit. At the previous visit with Dr. Rockey Situ, she was in NSR with HR of 55 bpm, as seen on EKG.   Today she presents for follow-up. She states one week ago her PCP noted she was in A-fib with RVR, HR in 130's, was completely asymptomatic with this. and PCP started her on Amiodarone and Diltiazem. Says she could not tolerate these medications and felt bad, says she got very swollen and couldn't move. Has stopped taking these medicines. Says the Lasix has helped improve the swelling in her legs. Denies any chest pain, palpitations, syncope, presyncope, dizziness, orthopnea, PND, any acute bleeding, claudication.   Past Medical History:  Diagnosis Date   Basal cell carcinoma, arm 11/13   on arm and face    Diverticulitis    colon   GERD (gastroesophageal reflux disease)    Hypertension    patient denies not on meds    Menopausal symptoms    Osteoarthrosis involving, or with mention of more than one site, but not specified as generalized, multiple sites    Osteoporosis     Peripheral vascular disease ([place])     Past Surgical History:  Procedure Laterality Date   ABDOMINAL HYSTERECTOMY     BREAST BIOPSY Left 2003   Left breast calcifications benign   COLONOSCOPY WITH PROPOFOL N/A 04/22/2018   Procedure: COLONOSCOPY WITH PROPOFOL;  Surgeon: Manya Silvas, MD;  Location: [facility] ENDOSCOPY;  Service: Endoscopy;  Laterality: N/A;   DILATION AND CURETTAGE OF UTERUS     rupture- peritonitis & hysterectomy 12/79   IR KYPHO LUMBAR INC FX REDUCE BONE BX UNI/BIL CANNULATION INC/IMAGING  01/03/2022   IR RADIOLOGIST EVAL & MGMT  01/26/2022   TOTAL HIP ARTHROPLASTY Left 10/19/2017   Procedure: LEFT TOTAL HIP ARTHROPLASTY ANTERIOR APPROACH;  Surgeon: Mcarthur Rossetti, MD;  Location: [facility] ORS;  Service: Orthopedics;  Laterality: Left;   VAGINAL DELIVERY     x2    Current Medications: Current Meds  Medication Sig   apixaban (ELIQUIS) 5 MG TABS tablet TAKE 1 TABLET BY MOUTH TWICE A DAY (Patient taking differently: Take 5 mg by mouth 2 (two) times daily.)   B Complex Vitamins (VITAMIN B-COMPLEX) TABS Take 1 tablet by mouth daily.   Cholecalciferol (VITAMIN D3) 1000 units CAPS Take 1,000 Units by mouth daily.   COLLAGEN PO Take 1 tablet by mouth daily.   Cyanocobalamin 5000 MCG CAPS Place 1 capsule under  the tongue daily.   diltiazem (CARDIZEM) 60 MG tablet Take 1 tablet (60 mg total) by mouth daily as needed. Take for elevated heart rate greater than 100 (Patient taking differently: Take 60 mg by mouth daily as needed (see below). Take for elevated heart rate greater than 100)   furosemide (LASIX) 20 MG tablet TAKE 1 TABLET BY MOUTH EVERY DAY (Patient taking differently: Take 20 mg by mouth as needed.)   gabapentin (NEURONTIN) 100 MG capsule Take 100 mg by mouth at bedtime.   Glucosamine HCl (GLUCOSAMINE PO) Take 2 capsules by mouth in the morning.   ibuprofen (ADVIL,MOTRIN) 200 MG tablet Take 400 mg by mouth daily as needed for headache or mild pain.   methocarbamol  (ROBAXIN) 500 MG tablet Take 500 mg by mouth 2 (two) times daily as needed for muscle spasms.   metoprolol succinate (TOPROL-XL) 100 MG 24 hr tablet Take 1 tablet (100 mg total) by mouth daily. Take with or immediately following a meal.   Multiple Vitamin (MULTIVITAMIN) tablet Take 1 tablet by mouth daily.   pantoprazole (PROTONIX) 40 MG tablet Take 40 mg by mouth daily.   potassium chloride (KLOR-CON) 10 MEQ tablet Take 1 tablet (10 mEq total) by mouth daily. (Patient taking differently: Take 10 mEq by mouth as needed (to take with lasix).)   rosuvastatin (CRESTOR) 5 MG tablet TAKE 1 TABLET (5 MG TOTAL) BY MOUTH DAILY.   traMADol (ULTRAM) 50 MG tablet Take 1 tablet by mouth 3 (three) times daily.   Zinc 50 MG TABS Take 50 mg by mouth daily.   [DISCONTINUED] pantoprazole (PROTONIX) 40 MG tablet Take by mouth.     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 [employer],    Comment: Retired completely now   Occupation:    Tobacco Use   Smoking status: Never   Smokeless tobacco: Never  Vaping Use   Vaping Use: Never used  Substance and Sexual Activity   Alcohol use: Yes    Alcohol/week: 3.0 standard drinks of alcohol    Types: 3 Glasses of wine per week   Drug use: Never   Sexual activity: Not on file  Other Topics Concern   Not on file  Social History Narrative   No living will   Would want daughter, Margarita Grizzle,  Then son Ginnie Smart to make health care decisions for her   Would accept resuscitation attempts   Not sure about tube feedings   Social Determinants of Health   Financial Resource Strain: Not on file  Food Insecurity: Not on file  Transportation Needs: Not on file  Physical Activity: Not on  file  Stress: Not on file  Social Connections: Not on file     Family History: The patient's family history includes Breast cancer in her cousin; Breast cancer (age of onset: 9) in her sister; Cancer in her maternal grandfather; Coronary artery disease in her father; Osteoporosis in her mother. There is no history of Diabetes or Hypertension.  ROS:   Review of Systems  Constitutional: Negative.   HENT: Negative.    Eyes: Negative.   Respiratory:  Positive for shortness of breath. Negative for cough, hemoptysis, sputum production and  wheezing.        Shortness of breath with exertion  Cardiovascular:  Positive for leg swelling. Negative for chest pain, palpitations, orthopnea, claudication and PND.  Gastrointestinal: Negative.   Genitourinary: Negative.   Musculoskeletal:  Positive for joint pain. Negative for back pain, falls, myalgias and neck pain.  Skin: Negative.   Neurological: Negative.   Endo/Heme/Allergies:  Negative for environmental allergies and polydipsia. Bruises/bleeds easily.  Psychiatric/Behavioral: Negative.     Please see the history of present illness.    All other systems reviewed and are negative.  EKGs/Labs/Other Studies Reviewed:    The following studies were reviewed today:   EKG:  EKG is ordered today.  The ekg ordered today demonstrates sinus bradycardia, 53 bpm, moderate LVH, otherwise nothing acute.   Coronary Calcium Score on 06/23/2020: Coronary calcium score of 157. This was 66th percentile for age and sex matched control.   2. Recommend guideline directed medical therapy (ASA and statin if no contraindication) and risk factor modification.   2D Echocardiogram on 05/25/2020: 1. Left ventricular ejection fraction, by estimation, is 55 to 60%. The  left ventricle has normal function. The left ventricle has no regional  wall motion abnormalities. Left ventricular diastolic parameters are  consistent with Grade II diastolic  dysfunction  (pseudonormalization).   2. Right ventricular systolic function is normal. The right ventricular  size is normal. There is mildly elevated pulmonary artery systolic  pressure.   3. The mitral valve is grossly normal. Mild mitral valve regurgitation.   4. The aortic valve has an indeterminant number of cusps. Aortic valve  regurgitation is trivial. No aortic stenosis is present.   5. The inferior vena cava dilated; unable to assess respiratory  variation.   Vascular US of lower extremities (venous) on 04/12/2020: RIGHT:  - No evidence of common femoral vein obstruction.    LEFT:  - There is no evidence of deep vein thrombosis in the lower extremity.    - No cystic structure found in the popliteal fossa.     Exercise Tolerance Test 2017: Patient demonstrated poor functional capacity. Patient achieved 5 METs and achieved 89% of maximum predicted heart rate. Patient denied chest pain. No arrhythmias. No clear ST depression identified, however significant artifact noted throughout this study. This diminishes specificity of this test result. If clinically indicated, may need further evaluation with nuclear imaging.     Recent Labs: 02/16/2022: B Natriuretic Peptide 317.8; BUN 15; Creatinine, Ser 0.78; Magnesium 2.3; Potassium 5.0; Sodium 144; TSH 3.417  Recent Lipid Panel    Component Value Date/Time   CHOL 179 03/13/2018 1223   TRIG 138.0 03/13/2018 1223   HDL 49.30 03/13/2018 1223   CHOLHDL 4 03/13/2018 1223   VLDL 27.6 03/13/2018 1223   LDLCALC 102 (H) 03/13/2018 1223   LDLDIRECT 134.7 03/03/2011 1046     Risk Assessment/Calculations:    CHA2DS2-VASc Score = 5  This indicates a 7.2% annual risk of stroke. The patient's score is based upon: CHF History: 0 HTN History: 1 Diabetes History: 0 Stroke History: 0 Vascular Disease History: 1 Age Score: 2 Gender Score: 1    Physical Exam:    VS:  BP 126/76 (BP Location: Left Arm, Patient Position: Sitting)   Pulse (!) 55    Ht 5' 4.5" (1.638 m)   Wt 229 lb 9.6 oz (104.1 kg)   LMP  (LMP Unknown)   SpO2 98%   BMI 38.80 kg/m     Wt Readings from Last 3 Encounters:  02/16/22 229 lb 9.6 oz (104.1 kg)  02/01/21 239 lb 8 oz (108.6 kg)  10/20/20 243 lb (110.2 kg)     GEN: Well nourished, well developed in no acute distress HEENT: Normal  NECK: No JVD; No carotid bruits CARDIAC: Slow rate and regular rhythm, no murmurs, rubs, gallops; 2+ peripheral pulses throughout, strong and equal bilaterally RESPIRATORY:  Clear to auscultation without rales, wheezing or rhonchi  ABDOMEN: Soft, non-tender, non-distended, bowel sounds x 4 MUSCULOSKELETAL:  2+ pitting edema along BLE (improving per patient's report); No deformity  SKIN: Warm and dry, scattered bruising noted to bilateral forearms NEUROLOGIC:  Alert and oriented x 3 PSYCHIATRIC:  Normal affect   ASSESSMENT:    1. PAF (paroxysmal atrial fibrillation) ([place])   2. Bradycardia   3. OSA on CPAP   4. Leg edema   5. Hypertension, unspecified type   6. Family history of early CAD    PLAN:    In order of problems listed above:  Paroxysmal A-fib - chronic, stable Denies any symptoms of palpitations. 12 lead EKG today shows sinus bradycardia, 53 bpm, moderate LVH, otherwise nothing acute. On appropriate dosing of Eliquis. Continue Metoprolol succinate 100 mg daily and Eliquis 5 mg BID and Diltiazem 60 mg PRN for palpitations. Arrange 14 day Zio to monitor A-fib burden. Obtain the following bloodwork: TSH, Mag, and BMET.   2. Bradycardia - recent, stable Asymptomatic with this. HR today is 55 bpm. Continue Metoprolol succinate 100 mg daily. Obtain BMET, Mag, and TSH today. Discussed with her to obtain a pulse oximeter to monitor her pulse readings.   3. HTN - chronic, stable BP stable in office today, 126/76. Continue current medication regimen. Obtain the following bloodwork mentioned above.   4. OSA on CPAP - chronic, stable Reports she was not wearing CPAP  for a period of time and now is wearing this nightly. Encouraged continued compliance with CPAP.  5. Family history of CAD  Stable with no anginal symptoms. No indication for ischemic evaluation. Last LDL in 10/2021 was 76. Continue Crestor 5 mg daily and metoprolol succinate 100 mg daily. Not on ASA d/t Eliquis.   6. Class 2 Obesity - chronic, stable Encouraged weight loss via diet and exercise encouraged after left knee pain improves.   7. Leg edema - recent, improving Recent reaction to Amiodarone and Diltiazem as mentioned in HPI. Improving d/t Lasix. Obtain BNP and BMET. Obtain 2D echo after 14 day Zio monitor is worn to evaluate heart and valvular function.   7. Disposition: F/U in 6 weeks or sooner if anything changes.    Medication Adjustments/Labs and Tests Ordered: Current medicines are reviewed at length with the patient today.  Concerns regarding medicines are outlined above.  Orders Placed This Encounter  Procedures   Brain natriuretic peptide   Basic metabolic panel   Magnesium   TSH   LONG TERM MONITOR (3-14 DAYS)   EKG 12-Lead   ECHOCARDIOGRAM COMPLETE   No orders of the defined types were placed in this encounter.   Patient Instructions  Medication Instructions:  Your Physician recommend you continue on your current medication as directed.    *If you need a refill on your cardiac medications before your next appointment, please call your pharmacy*   Lab Work: Your physician recommends lab work today (BNP, BMP, TSH, Mg) at [facility]  If you have labs (blood work) drawn today and your tests are completely normal, you will receive your results only by: [place] (if  you have MyChart) OR A paper copy in the mail If you have any lab test that is abnormal or we need to change your treatment, we will call you to review the results.   Testing/Procedures: Your physician has requested that you have an echocardiogram in 4 weeks. Echocardiography is a  painless test that uses sound waves to create images of your heart. It provides your doctor with information about the size and shape of your heart and how well your heart's chambers and valves are working. This procedure takes approximately one hour. There are no restrictions for this procedure. [place] physician has recommended that you wear a 14 day Zio monitor.   This monitor is a medical device that records the heart's electrical activity. Doctors most often use these monitors to diagnose arrhythmias. Arrhythmias are problems with the speed or rhythm of the heartbeat. The monitor is a small device applied to your chest. You can wear one while you do your normal daily activities. While wearing this monitor if you have any symptoms to push the button and record what you felt. Once you have worn this monitor for the period of time provider prescribed (Usually 14 days), you will return the monitor device in the postage paid box. Once it is returned they will download the data collected and provide [name] with a report which the provider will then review and we will call you with those results. Important tips:  Avoid showering during the first 24 hours of wearing the monitor. Avoid excessive sweating to help maximize wear time. Do not submerge the device, no hot tubs, and no swimming pools. Keep any lotions or oils away from the patch. After 24 hours you may shower with the patch on. Take brief showers with your back facing the shower head.  Do not remove patch once it has been placed because that will interrupt data and decrease adhesive wear time. Push the button when you have any symptoms and write down what you were feeling. Once you have completed wearing your monitor, remove and place into box which has postage paid and place in your outgoing mailbox.  If for some reason you have misplaced your box then call our office and we can provide another box and/or mail it off for  you.     Follow-Up: At [facility], you and your health needs are our priority.  As part of our continuing mission to provide you with exceptional heart care, we have created designated Provider Care Teams.  These Care Teams include your primary Cardiologist (physician) and Advanced Practice Providers (APPs -  Physician Assistants and Nurse Practitioners) who all work together to provide you with the care you need, when you need it.  We recommend signing up for the patient portal called "MyChart".  Sign up information is provided on this After Visit Summary.  MyChart is used to connect with patients for Virtual Visits (Telemedicine).  Patients are able to view lab/test results, encounter notes, upcoming appointments, etc.  Non-urgent messages can be sent to your provider as well.   To learn more about what you can do with MyChart, go to [email or web address].    Your next appointment:   6 week(s)  The format for your next appointment:   In Person  Provider:   Murray Hodgkins, NP             Signed, Finis Bud, NP  02/16/2022 5:09 PM    [place]

## 2022-02-16 ENCOUNTER — Other Ambulatory Visit
Admission: RE | Admit: 2022-02-16 | Discharge: 2022-02-16 | Disposition: A | Discharge status: Home / Self Care | Payer: Medicare PPO | Source: Ambulatory Visit | Attending: Nurse Practitioner | Admitting: Nurse Practitioner

## 2022-02-16 ENCOUNTER — Ambulatory Visit (INDEPENDENT_AMBULATORY_CARE_PROVIDER_SITE_OTHER): Payer: Medicare PPO

## 2022-02-16 ENCOUNTER — Ambulatory Visit: Payer: Medicare PPO | Attending: Nurse Practitioner | Admitting: Nurse Practitioner

## 2022-02-16 ENCOUNTER — Encounter: Payer: Self-pay | Admitting: Nurse Practitioner

## 2022-02-16 VITALS — BP 126/76 | HR 55 | Ht 64.5 in | Wt 229.6 lb

## 2022-02-16 DIAGNOSIS — I480 Paroxysmal atrial fibrillation: Secondary | ICD-10-CM | POA: Insufficient documentation

## 2022-02-16 DIAGNOSIS — R600 Localized edema: Secondary | ICD-10-CM | POA: Insufficient documentation

## 2022-02-16 DIAGNOSIS — G4733 Obstructive sleep apnea (adult) (pediatric): Secondary | ICD-10-CM | POA: Diagnosis not present

## 2022-02-16 DIAGNOSIS — Z9989 Dependence on other enabling machines and devices: Secondary | ICD-10-CM

## 2022-02-16 DIAGNOSIS — I10 Essential (primary) hypertension: Secondary | ICD-10-CM | POA: Diagnosis not present

## 2022-02-16 DIAGNOSIS — Z8249 Family history of ischemic heart disease and other diseases of the circulatory system: Secondary | ICD-10-CM

## 2022-02-16 DIAGNOSIS — R001 Bradycardia, unspecified: Secondary | ICD-10-CM

## 2022-02-16 DIAGNOSIS — Z6838 Body mass index (BMI) 38.0-38.9, adult: Secondary | ICD-10-CM

## 2022-02-16 DIAGNOSIS — E669 Obesity, unspecified: Secondary | ICD-10-CM

## 2022-02-16 LAB — BRAIN NATRIURETIC PEPTIDE: B Natriuretic Peptide: 317.8 pg/mL — ABNORMAL HIGH (ref 0.0–100.0)

## 2022-02-16 LAB — TSH: TSH: 3.417 u[IU]/mL (ref 0.350–4.500)

## 2022-02-16 LAB — MAGNESIUM: Magnesium: 2.3 mg/dL (ref 1.7–2.4)

## 2022-02-16 LAB — BASIC METABOLIC PANEL
Anion gap: 10 (ref 5–15)
BUN: 15 mg/dL (ref 8–23)
CO2: 27 mmol/L (ref 22–32)
Calcium: 9.2 mg/dL (ref 8.9–10.3)
Chloride: 107 mmol/L (ref 98–111)
Creatinine, Ser: 0.78 mg/dL (ref 0.44–1.00)
GFR, Estimated: >60 mL/min (ref >60)
Glucose, Bld: 94 mg/dL (ref 70–99)
Potassium: 5 mmol/L (ref 3.5–5.1)
Sodium: 144 mmol/L (ref 135–145)

## 2022-02-16 NOTE — Patient Instructions (Addendum)
Medication Instructions:  Your Physician recommend you continue on your current medication as directed.    *If you need a refill on your cardiac medications before your next appointment, please call your pharmacy*   Lab Work: Your physician recommends lab work today (BNP, BMP, TSH, Mg) at [facility]  If you have labs (blood work) drawn today and your tests are completely normal, you will receive your results only by: MyChart Message (if you have MyChart) OR A paper copy in the mail If you have any lab test that is abnormal or we need to change your treatment, we will call you to review the results.   Testing/Procedures: Your physician has requested that you have an echocardiogram in 4 weeks. Echocardiography is a painless test that uses sound waves to create images of your heart. It provides your doctor with information about the size and shape of your heart and how well your heart's chambers and valves are working. This procedure takes approximately one hour. There are no restrictions for this procedure. [place] physician has recommended that you wear a 14 day Zio monitor.   This monitor is a medical device that records the heart's electrical activity. Doctors most often use these monitors to diagnose arrhythmias. Arrhythmias are problems with the speed or rhythm of the heartbeat. The monitor is a small device applied to your chest. You can wear one while you do your normal daily activities. While wearing this monitor if you have any symptoms to push the button and record what you felt. Once you have worn this monitor for the period of time provider prescribed (Usually 14 days), you will return the monitor device in the postage paid box. Once it is returned they will download the data collected and provide [name] with a report which the provider will then review and we will call you with those results. Important tips:  Avoid showering during the first 24 hours of wearing the  monitor. Avoid excessive sweating to help maximize wear time. Do not submerge the device, no hot tubs, and no swimming pools. Keep any lotions or oils away from the patch. After 24 hours you may shower with the patch on. Take brief showers with your back facing the shower head.  Do not remove patch once it has been placed because that will interrupt data and decrease adhesive wear time. Push the button when you have any symptoms and write down what you were feeling. Once you have completed wearing your monitor, remove and place into box which has postage paid and place in your outgoing mailbox.  If for some reason you have misplaced your box then call our office and we can provide another box and/or mail it off for you.     Follow-Up: At [facility], you and your health needs are our priority.  As part of our continuing mission to provide you with exceptional heart care, we have created designated Provider Care Teams.  These Care Teams include your primary Cardiologist (physician) and Advanced Practice Providers (APPs -  Physician Assistants and Nurse Practitioners) who all work together to provide you with the care you need, when you need it.  We recommend signing up for the patient portal called "MyChart".  Sign up information is provided on this After Visit Summary.  MyChart is used to connect with patients for Virtual Visits (Telemedicine).  Patients are able to view lab/test results, encounter notes, upcoming appointments, etc.  Non-urgent messages can be sent to your  provider as well.   To learn more about what you can do with MyChart, go to [email or web address].    Your next appointment:   6 week(s)  The format for your next appointment:   In Person  Provider:   Murray Hodgkins, NP

## 2022-02-17 ENCOUNTER — Telehealth: Payer: Self-pay | Admitting: Nurse Practitioner

## 2022-02-17 DIAGNOSIS — I480 Paroxysmal atrial fibrillation: Secondary | ICD-10-CM

## 2022-02-17 NOTE — Telephone Encounter (Signed)
Called and updated pt regarding her blood work results from yesterday.  Her blood work is overall normal, however BNP was mildly elevated.  She stated she took her Lasix and potassium pill this morning.  I stated this may be due to the swelling in her legs.  We will see what 2D echocardiogram shows.  Discussed with her to monitor her weight daily and to let us know if she gains more than 3 pounds in 1 day or more than 5 pounds in a week, discussed a low-salt diet, and discussed fluid restrictions to less than 2 L/day.  She verbalized understanding.  She told me yesterday she has had a prior history of knee injections for her knee pain. I encouraged her that if she continues to get knee injections, her orthopedic doctor's office needs to let us know for clearance as she is on Eliquis. She verbalized understanding of this and was appreciative of my call.   Finis Bud, NP

## 2022-02-21 ENCOUNTER — Other Ambulatory Visit: Payer: Self-pay | Admitting: Cardiovascular Disease

## 2022-02-21 NOTE — Telephone Encounter (Signed)
Prescription refill request for Eliquis received. Indication: PAF Last office visit: 01/19/22  Annita Brod NP Scr: 0.78 on 02/16/22 Age: 76 Weight: 104.1kg  Based on above findings Eliquis '5mg'$  twice daily is the appropriate dose.  Refill approved.

## 2022-02-23 ENCOUNTER — Ambulatory Visit
Admission: EM | Admit: 2022-02-23 | Discharge: 2022-02-23 | Disposition: A | Discharge status: Home / Self Care | Payer: Medicare PPO | Attending: Emergency Medicine | Admitting: Emergency Medicine

## 2022-02-23 ENCOUNTER — Encounter: Payer: Self-pay | Admitting: Emergency Medicine

## 2022-02-23 DIAGNOSIS — L03116 Cellulitis of left lower limb: Secondary | ICD-10-CM | POA: Diagnosis not present

## 2022-02-23 HISTORY — DX: Unspecified atrial fibrillation: I48.91

## 2022-02-23 MED ORDER — DOXYCYCLINE HYCLATE 100 MG PO CAPS: 100.0000 mg | ORAL_CAPSULE | Freq: Two times a day (BID) | ORAL | 0 refills | Status: AC

## 2022-02-23 NOTE — ED Provider Notes (Signed)
Roderic Palau    CSN: [id number] Arrival date & time: 02/23/22  1011      History   Chief Complaint Chief Complaint  Patient presents with   Rash    HPI Veronica Stanley is a 76 y.o. female.   Patient presents with erythema, tenderness and increased swelling to the left lower extremity beginning 2 days ago.  Attempts are primarily along the inside of the ankle extending into the foot.  Patient endorses baseline swelling has worsened, takes daily furosemide.  Range of motion of the ankle is intact.  To bear weight without complication.  Denies injury, trauma, numbness or tingling.  Past Medical History:  Diagnosis Date   Atrial fibrillation ([place])    Basal cell carcinoma, arm 04/2012   on arm and face    Diverticulitis    colon   GERD (gastroesophageal reflux disease)    Hypertension    patient denies not on meds    Menopausal symptoms    Osteoarthrosis involving, or with mention of more than one site, but not specified as generalized, multiple sites    Osteoporosis    Peripheral vascular disease [facility])     Patient Active Problem List   Diagnosis Date Noted   Closed compression fracture of body of L1 vertebra ([place]) 01/10/2022   Elevated coronary artery calcium score 12/21/2021   B12 deficiency 10/27/2020   Paroxysmal atrial fibrillation ([place]) 04/27/2020   Obstructive sleep apnea 04/17/2018   Sleep disorder 03/13/2018   Morbid obesity ([place]) 11/28/2017   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   Unilateral primary osteoarthritis, left hip 09/17/2017   Osteoarthritis of both hips 11/10/2015   Advance directive discussed with patient 11/03/2014   Routine general medical examination at a health care facility 03/03/2011   Osteoarthritis, multiple sites    GERD 10/11/2007   ACTINIC KERATOSIS 04/17/2007   Essential hypertension, benign 03/28/2007   DIVERTICULOSIS, COLON 03/28/2007   OSTEOPOROSIS 03/28/2007    Past Surgical History:  Procedure  Laterality Date   ABDOMINAL HYSTERECTOMY     BREAST BIOPSY Left 2003   Left breast calcifications benign   COLONOSCOPY WITH PROPOFOL N/A 04/22/2018   Procedure: COLONOSCOPY WITH PROPOFOL;  Surgeon: Manya Silvas, MD;  Location: [facility] ENDOSCOPY;  Service: Endoscopy;  Laterality: N/A;   DILATION AND CURETTAGE OF UTERUS     rupture- peritonitis & hysterectomy 12/79   IR KYPHO LUMBAR INC FX REDUCE BONE BX UNI/BIL CANNULATION INC/IMAGING  01/03/2022   IR RADIOLOGIST EVAL & MGMT  01/26/2022   TOTAL HIP ARTHROPLASTY Left 10/19/2017   Procedure: LEFT TOTAL HIP ARTHROPLASTY ANTERIOR APPROACH;  Surgeon: Mcarthur Rossetti, MD;  Location: [facility] ORS;  Service: Orthopedics;  Laterality: Left;   VAGINAL DELIVERY     x2    OB History   No obstetric history on file.      Home Medications    Prior to Admission medications   Medication Sig Start Date End Date Taking? Authorizing Provider  apixaban (ELIQUIS) 5 MG TABS tablet TAKE 1 TABLET BY MOUTH TWICE A DAY 02/21/22  Yes Gollan, Kathlene November, MD  B Complex Vitamins (VITAMIN B-COMPLEX) TABS Take 1 tablet by mouth daily. 09/21/21  Yes [provider]  Cholecalciferol (VITAMIN D3) 1000 units CAPS Take 1,000 Units by mouth daily.   Yes [provider]  Cyanocobalamin 5000 MCG CAPS Place 1 capsule under the tongue daily.   Yes [provider]  doxycycline (VIBRAMYCIN) 100 MG capsule Take 1 capsule (100  mg total) by mouth 2 (two) times daily. 02/23/22  Yes Ceira Hoeschen R, NP  furosemide (LASIX) 20 MG tablet TAKE 1 TABLET BY MOUTH EVERY DAY Patient taking differently: Take 20 mg by mouth as needed. 11/15/21  Yes Gollan, Kathlene November, MD  gabapentin (NEURONTIN) 100 MG capsule Take 100 mg by mouth at bedtime. 11/11/20  Yes [provider]  metoprolol succinate (TOPROL-XL) 100 MG 24 hr tablet Take 1 tablet (100 mg total) by mouth daily. Take with or immediately following a meal. 02/01/21  Yes Gollan, Kathlene November, MD  Multiple Vitamin  (MULTIVITAMIN) tablet Take 1 tablet by mouth daily.   Yes [provider]  pantoprazole (PROTONIX) 40 MG tablet Take 40 mg by mouth daily. 10/14/21  Yes [provider]  potassium chloride (KLOR-CON) 10 MEQ tablet Take 1 tablet (10 mEq total) by mouth daily. Patient taking differently: Take 10 mEq by mouth as needed (to take with lasix). 10/20/20  Yes Loel Dubonnet, NP  rosuvastatin (CRESTOR) 5 MG tablet TAKE 1 TABLET (5 MG TOTAL) BY MOUTH DAILY. 01/11/22  Yes Gollan, Kathlene November, MD  traMADol (ULTRAM) 50 MG tablet Take 1 tablet by mouth 3 (three) times daily. 12/08/21  Yes [provider]  COLLAGEN PO Take 1 tablet by mouth daily.    [provider]  diltiazem (CARDIZEM) 60 MG tablet Take 1 tablet (60 mg total) by mouth daily as needed. Take for elevated heart rate greater than 100 Patient taking differently: Take 60 mg by mouth daily as needed (see below). Take for elevated heart rate greater than 100 05/10/20   Gollan, Kathlene November, MD  Glucosamine HCl (GLUCOSAMINE PO) Take 2 capsules by mouth in the morning.    [provider]  ibuprofen (ADVIL,MOTRIN) 200 MG tablet Take 400 mg by mouth daily as needed for headache or mild pain.    [provider]  methocarbamol (ROBAXIN) 500 MG tablet Take 500 mg by mouth 2 (two) times daily as needed for muscle spasms. 11/28/21   [provider]  Zinc 50 MG TABS Take 50 mg by mouth as needed.    [provider]    Family History Family History  Problem Relation Age of Onset   Osteoporosis Mother    Coronary artery disease Father    Breast cancer Sister 44       twice   Cancer Maternal Grandfather        colon cancer   Breast cancer Cousin    Diabetes Neg Hx    Hypertension Neg Hx     Social History Social History   Tobacco Use   Smoking status: Never   Smokeless tobacco: Never  Vaping Use   Vaping Use: Never used  Substance Use Topics   Alcohol use: Yes    Alcohol/week: 3.0  standard drinks of alcohol    Types: 3 Glasses of wine per week   Drug use: Never     Allergies   Penicillins and Ibandronate sodium   Review of Systems Review of Systems  Constitutional: Negative.   Respiratory: Negative.    Cardiovascular: Negative.   Skin:  Positive for rash. Negative for color change, pallor and wound.     Physical Exam Triage Vital Signs ED Triage Vitals  Enc Vitals Group     BP 02/23/22 1044 (!) 152/77     Pulse Rate 02/23/22 1044 60     Resp 02/23/22 1044 16     Temp 02/23/22 1044 98.7 F (37.1 C)  Temp Source 02/23/22 1044 Oral     SpO2 02/23/22 1044 95 %     Weight --      Height --      Head Circumference --      Peak Flow --      Pain Score 02/23/22 1051 3     Pain Loc --      Pain Edu? --      Excl. in [place]? --    No data found.  Updated Vital Signs BP (!) 152/77 (BP Location: Right Arm)   Pulse 60   Temp 98.7 F (37.1 C) (Oral)   Resp 16   LMP  (LMP Unknown)   SpO2 95%   Visual Acuity Right Eye Distance:   Left Eye Distance:   Bilateral Distance:    Right Eye Near:   Left Eye Near:    Bilateral Near:     Physical Exam Constitutional:      Appearance: Normal appearance.  HENT:     Head: Normocephalic.  Eyes:     Extraocular Movements: Extraocular movements intact.  Pulmonary:     Effort: Pulmonary effort is normal.  Skin:    Comments: Erythema, tenderness and moderate swelling to the medial posterior of the left lower extremity with 3-4+ pitting edema to the left ankle and foot, site is warm to touch , 2+ dorsalis pedis pulse, range of motion intact, able to bear weight, nondraining  Neurological:     Mental Status: She is alert and oriented to person, place, and time. Mental status is at baseline.  Psychiatric:        Mood and Affect: Mood normal.        Behavior: Behavior normal.      UC Treatments / Results  Labs (all labs ordered are listed, but only abnormal results are displayed) Labs Reviewed - No  data to display  EKG   Radiology No results found.  Procedures Procedures (including critical care time)  Medications Ordered in UC Medications - No data to display  Initial Impression / Assessment and Plan / UC Course  I have reviewed the triage vital signs and the nursing notes.  Pertinent labs & imaging results that were available during my care of the patient were reviewed by me and considered in my medical decision making (see chart for details).  Cellulitis of left lower leg  Presentation is consistent with soft tissue infection, discussed with patient, placed on doxycycline and recommend ice, elevation, over-the-counter analgesics for additional supportive measures given strict precautions that if symptoms persist or worsen she is to follow-up with urgent care or PCP for reevaluation Final Clinical Impressions(s) / UC Diagnoses   Final diagnoses:  Cellulitis of left lower leg     Discharge Instructions      Today you are being treated for cellulitis which is an infection of the soft tissues of the skin.  This type of infection occurs with there is any opening in the skin that allows for bacteria to enter causing the swelling, redness, pain that you are experiencing, cannot also begin to cause drainage  Begin use of doxycycline every morning and every evening to clear this bacteria you will begin to see improvement after 24 hours of medicine use and steady progression from there  You may elevate your extremity when sitting and lying which will help to reduce swelling  May place ice over the affected area 10 to 15-minute intervals for comfort  You may take Tylenol 500 mg every  6 hours as needed for pain  If symptoms have not cleared by completion of antibiotic please follow-up with urgent care or primary doctor for reevaluation and further management   ED Prescriptions     Medication Sig Dispense Auth. Provider   doxycycline (VIBRAMYCIN) 100 MG capsule Take 1  capsule (100 mg total) by mouth 2 (two) times daily. 14 capsule Gizzelle Lacomb, Leitha Schuller, NP      PDMP not reviewed this encounter.   Hans Eden, NP 02/23/22 1138

## 2022-02-23 NOTE — ED Triage Notes (Signed)
Patient c/o Lft Lower leg redness x 2 days ago.   Patient endorses tenderness to site.   Patient endorses Lft lower leg  swelling.   Patient denies any worsening to redness. Patient denies itchiness.   Patient hasn't taken any medications for symptoms.

## 2022-02-23 NOTE — Discharge Instructions (Addendum)
Today you are being treated for cellulitis which is an infection of the soft tissues of the skin.  This type of infection occurs with there is any opening in the skin that allows for bacteria to enter causing the swelling, redness, pain that you are experiencing, cannot also begin to cause drainage  Begin use of doxycycline every morning and every evening to clear this bacteria you will begin to see improvement after 24 hours of medicine use and steady progression from there  You may elevate your extremity when sitting and lying which will help to reduce swelling  May place ice over the affected area 10 to 15-minute intervals for comfort  You may take Tylenol 500 mg every 6 hours as needed for pain  If symptoms have not cleared by completion of antibiotic please follow-up with urgent care or primary doctor for reevaluation and further management

## 2022-03-14 ENCOUNTER — Telehealth: Payer: Self-pay

## 2022-03-14 NOTE — Telephone Encounter (Signed)
Patient returned call to office to review monitor. States she doesn't want the referral to EP. She states that she doesn't feel the SVT and doesn't see a need to try and fix it. Will call us back if this ever changes.

## 2022-03-14 NOTE — Telephone Encounter (Signed)
Attempted to call patient again with monitor results. Again, no answer. DPR permission on file to leave message, so detailed message was left with results and comments from Finis Bud, NP. Asked pt that if she would like to see EP for her tachycardia, to call office back and we will place referral.

## 2022-03-14 NOTE — Telephone Encounter (Signed)
Call transferred  

## 2022-03-14 NOTE — Telephone Encounter (Signed)
-----   Message from Finis Bud, NP sent at 03/13/2022  9:32 AM EDT ----- Please update Veronica Stanley regarding her monitor results.  She was in predominant sinus rhythm, average heart rate 60 bpm, frequent episodes of SVT, longest episode lasting around 30 seconds with average heart rate 122.  Run with the fastest interval lasting 6 beats with a max heart rate 176.  Rare early ventricular beats.  No triggered events recorded.  I do not have room to titrate up on her beta-blocker, baseline SB in office when I saw her.  History of intolerance to previous antiarrhythmic medication.  Would she be agreeable to an EP referral?  If so, please place referral.   Thanks!  Finis Bud, AGNP-C

## 2022-03-22 ENCOUNTER — Ambulatory Visit: Payer: Medicare PPO | Attending: Nurse Practitioner

## 2022-03-22 DIAGNOSIS — R600 Localized edema: Secondary | ICD-10-CM

## 2022-03-22 LAB — ECHOCARDIOGRAM COMPLETE
AR max vel: 2.5 cm2
AV Area VTI: 2.28 cm2
AV Area mean vel: 2.22 cm2
AV Mean grad: 6 mmHg
AV Peak grad: 10.4 mmHg
Ao pk vel: 1.61 m/s
Area-P 1/2: 2.82 cm2
Calc EF: 55.4 %
P 1/2 time: 452 msec
S' Lateral: 3.3 cm
Single Plane A2C EF: 54.6 %
Single Plane A4C EF: 55.3 %

## 2022-03-30 ENCOUNTER — Encounter: Payer: Self-pay | Admitting: Nurse Practitioner

## 2022-03-30 ENCOUNTER — Ambulatory Visit: Payer: Medicare PPO | Attending: Nurse Practitioner | Admitting: Nurse Practitioner

## 2022-03-30 VITALS — BP 132/80 | HR 68 | Ht 66.5 in | Wt 232.4 lb

## 2022-03-30 DIAGNOSIS — G4733 Obstructive sleep apnea (adult) (pediatric): Secondary | ICD-10-CM | POA: Diagnosis not present

## 2022-03-30 DIAGNOSIS — I10 Essential (primary) hypertension: Secondary | ICD-10-CM

## 2022-03-30 DIAGNOSIS — I480 Paroxysmal atrial fibrillation: Secondary | ICD-10-CM | POA: Diagnosis not present

## 2022-03-30 DIAGNOSIS — I5032 Chronic diastolic (congestive) heart failure: Secondary | ICD-10-CM | POA: Diagnosis not present

## 2022-03-30 DIAGNOSIS — R931 Abnormal findings on diagnostic imaging of heart and coronary circulation: Secondary | ICD-10-CM

## 2022-03-30 NOTE — Patient Instructions (Signed)
Medication Instructions:  No changes at this time.   *If you need a refill on your cardiac medications before your next appointment, please call your pharmacy*   Lab Work: None  If you have labs (blood work) drawn today and your tests are completely normal, you will receive your results only by: [place] (if you have MyChart) OR A paper copy in the mail If you have any lab test that is abnormal or we need to change your treatment, we will call you to review the results.   Testing/Procedures: None   Follow-Up: At [facility], you and your health needs are our priority.  As part of our continuing mission to provide you with exceptional heart care, we have created designated Provider Care Teams.  These Care Teams include your primary Cardiologist (physician) and Advanced Practice Providers (APPs -  Physician Assistants and Nurse Practitioners) who all work together to provide you with the care you need, when you need it.   Your next appointment:   3 month(s)  The format for your next appointment:   In Person  Provider:   Ida Rogue, MD       Important Information About Sugar

## 2022-03-30 NOTE — Progress Notes (Signed)
Office Visit    Patient Name: Veronica Stanley Date of Encounter: 03/30/2022  Primary Care Provider:  Rusty Aus, MD Primary Cardiologist:  Ida Rogue, MD  Chief Complaint    76 y/o ? w/ a h/o PAF, HTN, OSA on CPAP, venous insufficiency, chronic HFpEF, coronary Ca2+ on CT, presents for f/u related to paroxysmal A-fib.  Past Medical History    Past Medical History:  Diagnosis Date   Agatston coronary artery calcium score between 100 and 199    a. 06/2020 Cardiac CT: Ca2+ = 157 (66th %i'le).   Basal cell carcinoma, arm 04/2012   on arm and face    Chronic heart failure with preserved ejection fraction (HFpEF) ([place])    a. 05/2020 Echo: EF 55-60%, no rwma, GrII DD, nl RV fxn, mild MR, triv AI.   Diverticulitis    colon   GERD (gastroesophageal reflux disease)    Hypertension    patient denies not on meds    Menopausal symptoms    Osteoarthrosis involving, or with mention of more than one site, but not specified as generalized, multiple sites    Osteoporosis    PAF (paroxysmal atrial fibrillation) ([place])    a. CHA2DS2VASc = 5-6 -->eliquis.   Peripheral vascular disease (HCC)    PSVT (paroxysmal supraventricular tachycardia)    a. 02/2022 Zio: Predominantly sinus rhythm-60 bpm (41-176).  95 SVT runs (fastest 176x6 beats; longest 20.9 seconds at 122 bpm).  No triggered events.   Past Surgical History:  Procedure Laterality Date   ABDOMINAL HYSTERECTOMY     BREAST BIOPSY Left 2003   Left breast calcifications benign   COLONOSCOPY WITH PROPOFOL N/A 04/22/2018   Procedure: COLONOSCOPY WITH PROPOFOL;  Surgeon: Manya Silvas, MD;  Location: [facility] ENDOSCOPY;  Service: Endoscopy;  Laterality: N/A;   DILATION AND CURETTAGE OF UTERUS     rupture- peritonitis & hysterectomy 12/79   IR KYPHO LUMBAR INC FX REDUCE BONE BX UNI/BIL CANNULATION INC/IMAGING  01/03/2022   IR RADIOLOGIST EVAL & MGMT  01/26/2022   TOTAL HIP ARTHROPLASTY Left 10/19/2017   Procedure: LEFT TOTAL HIP  ARTHROPLASTY ANTERIOR APPROACH;  Surgeon: Mcarthur Rossetti, MD;  Location: [facility] ORS;  Service: Orthopedics;  Laterality: Left;   VAGINAL DELIVERY     x2    Allergies  Allergies  Allergen Reactions   Penicillins Anaphylaxis, Shortness Of Breath, Swelling and Rash    Has patient had a PCN reaction causing immediate rash, facial/tongue/throat swelling, SOB or lightheadedness with hypotension: Yes Has patient had a PCN reaction causing severe rash involving mucus membranes or skin necrosis: Yes Has patient had a PCN reaction that required hospitalization: No Has patient had a PCN reaction occurring within the last 10 years: No If all of the above answers are "NO", then may proceed with Cephalosporin use.    Ibandronate Sodium Other (See Comments)    aches    History of Present Illness    76 y/o ? w/ a h/o PAF, HTN, OSA & CPAP, venous insufficiency, chronic HFpEF, and cor Ca2+ on CT. in October 2021, she was hospitalized with atrial fibrillation and rapid ventricular response.  She converted to sinus rhythm spontaneously.  Echo in December 2021 showed an EF of 55 to 60% with grade 2 diastolic dysfunction, and mild MR.  She underwent cardiac CT in January 2022, showing a coronary calcium score of 157 (66 percentile), and she has been managed with statin therapy.  She has had issues with lower extremity edema  requiring titration of diuretic therapy over the past 18 months.  In August 2023, she was evaluated secondary to a 1 week history of tachycardia consistent with recurrent atrial fibrillation.  She was initially seen by her primary care provider and placed on amiodarone and diltiazem.  Unfortunately, she experienced lower extremity swelling and felt like the medications were contributing and she discontinued them.  At follow-up visit on August 31, she was in sinus bradycardia at 53 bpm.  Lab work showed normal electrolytes and TSH with a mildly elevated BNP of 317.8.  A ZIO monitor was placed  to assess for A-fib burden and this showed predominantly sinus rhythm at 60 bpm with 95 brief runs of SVT, longest lasting 20.9 seconds with a maximum rate of 176 bpm.  There were no triggered events or A-fib or flutter noted.  Since her last visit, Veronica Stanley has done reasonably well.  She does not experience palpitations and denies any history of chest pain or dyspnea.  Ever since her back injury over the summer with subsequent kyphoplasty, her activity level has been less and she has struggled with low back pain and easy fatigability related to back discomfort.  She just started physical therapy and has completed 2 sessions and has already noted some improvement in her strength.  She denies PND, orthopnea, dizziness, syncope, or early satiety.  Lower extremity swelling has been mild, stable, and largely dependent upon salt intake and how long she is on her feet.  She has been using Lasix only as needed.  Home Medications    Current Outpatient Medications  Medication Sig Dispense Refill   apixaban (ELIQUIS) 5 MG TABS tablet TAKE 1 TABLET BY MOUTH TWICE A DAY 180 tablet 1   B Complex Vitamins (VITAMIN B-COMPLEX) TABS Take 1 tablet by mouth daily.     Cholecalciferol (VITAMIN D3) 1000 units CAPS Take 1,000 Units by mouth daily.     COLLAGEN PO Take 1 tablet by mouth daily.     Cyanocobalamin 5000 MCG CAPS Place 1 capsule under the tongue daily.     diltiazem (CARDIZEM) 60 MG tablet Take 1 tablet (60 mg total) by mouth daily as needed. Take for elevated heart rate greater than 100 (Patient taking differently: Take 60 mg by mouth daily as needed (see below). Take for elevated heart rate greater than 100) 60 tablet 1   doxycycline (VIBRAMYCIN) 100 MG capsule Take 1 capsule (100 mg total) by mouth 2 (two) times daily. 14 capsule 0   furosemide (LASIX) 20 MG tablet TAKE 1 TABLET BY MOUTH EVERY DAY (Patient taking differently: Take 20 mg by mouth as needed.) 90 tablet 3   gabapentin (NEURONTIN) 100 MG  capsule Take 100 mg by mouth at bedtime.     Glucosamine HCl (GLUCOSAMINE PO) Take 2 capsules by mouth in the morning.     ibuprofen (ADVIL,MOTRIN) 200 MG tablet Take 400 mg by mouth daily as needed for headache or mild pain.     methocarbamol (ROBAXIN) 500 MG tablet Take 500 mg by mouth 2 (two) times daily as needed for muscle spasms.     metoprolol succinate (TOPROL-XL) 100 MG 24 hr tablet Take 1 tablet (100 mg total) by mouth daily. Take with or immediately following a meal. 90 tablet 3   Multiple Vitamin (MULTIVITAMIN) tablet Take 1 tablet by mouth daily.     pantoprazole (PROTONIX) 40 MG tablet Take 40 mg by mouth daily.     potassium chloride (KLOR-CON) 10 MEQ tablet Take 1  tablet (10 mEq total) by mouth daily. (Patient taking differently: Take 10 mEq by mouth as needed (to take with lasix).) 30 tablet 5   rosuvastatin (CRESTOR) 5 MG tablet TAKE 1 TABLET (5 MG TOTAL) BY MOUTH DAILY. 90 tablet 0   traMADol (ULTRAM) 50 MG tablet Take 1 tablet by mouth 3 (three) times daily. (Patient not taking: Reported on 03/30/2022)     Zinc 50 MG TABS Take 50 mg by mouth as needed. (Patient not taking: Reported on 03/30/2022)     No current facility-administered medications for this visit.     Review of Systems    Ongoing mild dependent edema which overall has been stable.  She experiences back discomfort and easy fatigability in that setting.  She denies chest pain, dyspnea, palpitations, PND, orthopnea, dizziness, syncope, or early satiety.  All other systems reviewed and are otherwise negative except as noted above.    Physical Exam    VS:  BP 138/88 (BP Location: Left Arm)   Pulse 68   Ht 5' 6.5" (1.689 m)   Wt 232 lb 6.4 oz (105.4 kg)   LMP  (LMP Unknown)   SpO2 95%   BMI 36.95 kg/m  , BMI Body mass index is 36.95 kg/m.     Vitals:   03/30/22 1400 03/30/22 1830  BP: 138/88 132/80  Pulse:    SpO2:      GEN: Well nourished, well developed, in no acute distress. HEENT: normal. Neck:  Supple, no JVD, carotid bruits, or masses. Cardiac: RRR, no murmurs, rubs, or gallops. No clubbing, cyanosis, 1+ bilateral ankle edema.  Radials/PT 2+ and equal bilaterally.  Respiratory:  Respirations regular and unlabored, clear to auscultation bilaterally. GI: Soft, nontender, nondistended, BS + x 4. MS: no deformity or atrophy. Skin: warm and dry, no rash. Neuro:  Strength and sensation are intact. Psych: Normal affect.  Accessory Clinical Findings    ECG personally reviewed by me today -regular sinus rhythm, 68, LVH- no acute changes.  Lab Results  Component Value Date   WBC 6.5 04/12/2020   HGB 14.2 04/12/2020   HCT 45.6 04/12/2020   MCV 95.6 04/12/2020   PLT 279 04/12/2020   Lab Results  Component Value Date   CREATININE 0.78 02/16/2022   BUN 15 02/16/2022   NA 144 02/16/2022   K 5.0 02/16/2022   CL 107 02/16/2022   CO2 27 02/16/2022   Lab Results  Component Value Date   ALT 9 02/10/2020   AST 15 02/10/2020   ALKPHOS 69 02/10/2020   BILITOT 0.5 02/10/2020   Lab Results  Component Value Date   CHOL 179 03/13/2018   HDL 49.30 03/13/2018   LDLCALC 102 (H) 03/13/2018   LDLDIRECT 134.7 03/03/2011   TRIG 138.0 03/13/2018   CHOLHDL 4 03/13/2018     Assessment & Plan    1.  Paroxysmal atrial fibrillation: Noted to be in A-fib by primary care in August.  She was initially placed on oral diltiazem and amiodarone but developed swelling and discontinued.  At follow-up on August 31, she was in sinus rhythm and subsequent monitoring did not show any additional atrial fibrillation, though multiple runs of short-lived SVT were noted.  She is in sinus rhythm today and denies any palpitations at home.  I suspect previous swelling that she experienced on diltiazem/amiodarone may have been more related to development of atrial fibrillation and diastolic dysfunction than medications regardless, no indication to resume at this time.  Continue beta-blocker therapy.  She does  have a  prescription for short acting diltiazem to be used as needed for breakthrough tachycardia.  She is anticoagulated with Eliquis with stable H&H in May and normal renal function in August.  2.  Chronic HFpEF: EF 55 to 60% by echo in December 2021.  She notes that she has been doing well and using Lasix only sparingly.  Edema is largely dependent in nature and worse with higher sodium intake and long periods with her feet in a dependent position.  She has only mild edema today.  We did discuss that she would benefit from using compression hose more frequently.  Heart rate and blood pressure stable.  Continue beta-blocker therapy.  3.  Essential hypertension: Stable on beta-blocker therapy.  4.  Obstructive sleep apnea: Compliant with CPAP.  5.  Coronary calcium: Previously noted on CT.  She remains on statin therapy with an LDL of 76 in May.  No aspirin in the setting of Eliquis.  6.  Hyperlipidemia: As above, on statin with LDL of 76 in May 2023.  Normal LFTs at that time.  7.  Back pain: Status post kyphoplasty.  Notes some weakness in her back with ambulation, currently limiting activity.  She does not experience dyspnea.  Currently undergoing PT (2 sessions so far) and is already noted some improvement.  8.  Disposition: Follow-up in 3 months or sooner if necessary.   Murray Hodgkins, NP 03/30/2022, 6:20 PM

## 2022-04-04 NOTE — Addendum Note (Signed)
Addended by: Nestor Ramp on: 04/04/2022 02:32 PM   Modules accepted: Orders

## 2022-04-07 ENCOUNTER — Other Ambulatory Visit: Payer: Self-pay | Admitting: Cardiovascular Disease

## 2022-04-12 ENCOUNTER — Other Ambulatory Visit: Payer: Self-pay | Admitting: Cardiovascular Disease

## 2022-07-02 NOTE — Progress Notes (Unsigned)
Cardiology Office Note  Date:  07/03/2022   ID:  RYA RAUSCH, DOB 24-Dec-1945, MRN [id number]  PCP:  Rusty Aus, MD   Chief Complaint  Patient presents with   3 month follow up     Patient c/o shortness of breath with over exertion & bilateral LE edema. Patient took Metoprolol Succ 100 mg one tablet daily, Amiodarone 200 mg & diltiazem 120 mg for 3 days, her legs starting swelling, she went to Urgent Care, was diagnosed with cellulitis and was given new medication instructions. Medications reviewed by the patient verbally.     HPI:  [name] is a 77 year-old woman with past medical history of Obstructive sleep apnea, on CPAP Paroxysmal atrial fibrillation Hypertension Obesity Nonsmoker, no diabetes Leg swelling on calcium channel blockers Who presents for paroxysmal atrial fibrillation, leg edema  Last seen by myself in clinic 01/2021 Seen by one of our providers most recently March 30, 2022  August 2023, symptoms concerning for recurrent atrial fibrillation, atrial fibrillation documented on EKG with Dr. Sabra Heck, had no symptoms Placed on amiodarone and diltiazem , by primary care, with metoprolol which she was already taking She developed significant lower extremity edema, concerned that diltiazem contributed to lower extremity swelling Seen in urgent care for swelling  Zio monitor showing brief runs of SVT otherwise normal sinus rhythm, no significant A-fib  Eliquis : hair loss/rash Changed by PMD to xarelto 15 mg daily Creatinine clearance over 100  Furosemide 30 daily for leg swelling Continues to have significant pitting leg edema, weight is up 20 pounds from her baseline, abdomen feels tight, pants do not fit Continues on metoprolol succinate 50 BID  Some restaurant food, denies excessive fluid intake  CT coronary, Coronary calcium score of 157. This was 66th percentile  Compliant with CPAP for sleep apnea  EKG personally reviewed by myself on todays  visit  shows NSR with rate 74 beats per minute, left axis deviation, no significant ST-T wave changes   PMH:   has a past medical history of Agatston coronary artery calcium score between 100 and 199, Basal cell carcinoma, arm (04/2012), Chronic heart failure with preserved ejection fraction (HFpEF) ([place]), Diverticulitis, GERD (gastroesophageal reflux disease), Hypertension, Menopausal symptoms, Osteoarthrosis involving, or with mention of more than one site, but not specified as generalized, multiple sites, Osteoporosis, PAF (paroxysmal atrial fibrillation) ([place]), Peripheral vascular disease ([place]), and PSVT (paroxysmal supraventricular tachycardia).  PSH:    Past Surgical History:  Procedure Laterality Date   ABDOMINAL HYSTERECTOMY     BREAST BIOPSY Left 2003   Left breast calcifications benign   COLONOSCOPY WITH PROPOFOL N/A 04/22/2018   Procedure: COLONOSCOPY WITH PROPOFOL;  Surgeon: Manya Silvas, MD;  Location: [facility] ENDOSCOPY;  Service: Endoscopy;  Laterality: N/A;   DILATION AND CURETTAGE OF UTERUS     rupture- peritonitis & hysterectomy 12/79   IR KYPHO LUMBAR INC FX REDUCE BONE BX UNI/BIL CANNULATION INC/IMAGING  01/03/2022   IR RADIOLOGIST EVAL & MGMT  01/26/2022   TOTAL HIP ARTHROPLASTY Left 10/19/2017   Procedure: LEFT TOTAL HIP ARTHROPLASTY ANTERIOR APPROACH;  Surgeon: Mcarthur Rossetti, MD;  Location: [facility] ORS;  Service: Orthopedics;  Laterality: Left;   VAGINAL DELIVERY     x2    Current Outpatient Medications  Medication Sig Dispense Refill   B Complex Vitamins (VITAMIN B-COMPLEX) TABS Take 1 tablet by mouth daily.     Cholecalciferol (VITAMIN D3) 1000 units CAPS Take 1,000 Units by mouth daily.  COLLAGEN PO Take 1 tablet by mouth daily.     Cyanocobalamin 5000 MCG CAPS Place 1 capsule under the tongue daily.     diltiazem (CARDIZEM) 60 MG tablet Take 1 tablet (60 mg total) by mouth daily as needed. Take for elevated heart rate greater than 100 60 tablet 1    doxycycline (VIBRAMYCIN) 100 MG capsule Take 1 capsule (100 mg total) by mouth 2 (two) times daily. 14 capsule 0   furosemide (LASIX) 20 MG tablet Take 30 mg by mouth daily.     gabapentin (NEURONTIN) 100 MG capsule Take 100 mg by mouth at bedtime.     Glucosamine HCl (GLUCOSAMINE PO) Take 2 capsules by mouth in the morning.     ibuprofen (ADVIL,MOTRIN) 200 MG tablet Take 400 mg by mouth daily as needed for headache or mild pain.     methocarbamol (ROBAXIN) 500 MG tablet Take 500 mg by mouth 2 (two) times daily as needed for muscle spasms.     metoprolol succinate (TOPROL-XL) 100 MG 24 hr tablet Take 50 mg by mouth in the morning and at bedtime. Take with or immediately following a meal.     Multiple Vitamin (MULTIVITAMIN) tablet Take 1 tablet by mouth daily.     pantoprazole (PROTONIX) 40 MG tablet Take 40 mg by mouth daily.     potassium chloride (KLOR-CON) 10 MEQ tablet Take 1 tablet (10 mEq total) by mouth daily. 30 tablet 5   Rivaroxaban (XARELTO) 15 MG TABS tablet Take 15 mg by mouth daily with supper.     rosuvastatin (CRESTOR) 5 MG tablet TAKE 1 TABLET (5 MG TOTAL) BY MOUTH DAILY. 90 tablet 0   Zinc 50 MG TABS Take 50 mg by mouth as needed.     traMADol (ULTRAM) 50 MG tablet Take 1 tablet by mouth 3 (three) times daily. (Patient not taking: Reported on 03/30/2022)     No current facility-administered medications for this visit.    Allergies:   Penicillins, Calcium channel blockers, and Ibandronate sodium   Social History:  The patient  reports that she has never smoked. She has never used smokeless tobacco. She reports current alcohol use of about 3.0 standard drinks of alcohol per week. She reports that she does not use drugs.   Family History:   family history includes Breast cancer in her cousin; Breast cancer (age of onset: 70) in her sister; Cancer in her maternal grandfather; Coronary artery disease in her father; Osteoporosis in her mother.    Review of Systems: Review of  Systems  Constitutional: Negative.   HENT: Negative.    Respiratory: Negative.    Cardiovascular: Negative.   Gastrointestinal: Negative.   Musculoskeletal: Negative.   Neurological: Negative.   Psychiatric/Behavioral: Negative.    All other systems reviewed and are negative.   PHYSICAL EXAM: VS:  BP (!) 160/78 (BP Location: Left Arm, Patient Position: Sitting, Cuff Size: Large)   Pulse 74   Ht 5' 6.5" (1.689 m)   Wt 246 lb 2 oz (111.6 kg)   LMP  (LMP Unknown)   SpO2 95%   BMI 39.13 kg/m  , BMI Body mass index is 39.13 kg/m. Constitutional:  oriented to person, place, and time. No distress.  HENT:  Head: Grossly normal Eyes:  no discharge. No scleral icterus.  Neck: No JVD, no carotid bruits  Cardiovascular: Regular rate and rhythm, no murmurs appreciated Pulmonary/Chest: Clear to auscultation bilaterally, no wheezes or rails Abdominal: Soft.  no distension.  no tenderness.  Musculoskeletal:  Normal range of motion Neurological:  normal muscle tone. Coordination normal. No atrophy Skin: Skin warm and dry Psychiatric: normal affect, pleasant  Recent Labs: 02/16/2022: B Natriuretic Peptide 317.8; BUN 15; Creatinine, Ser 0.78; Magnesium 2.3; Potassium 5.0; Sodium 144; TSH 3.417    Lipid Panel Lab Results  Component Value Date   CHOL 179 03/13/2018   HDL 49.30 03/13/2018   LDLCALC 102 (H) 03/13/2018   TRIG 138.0 03/13/2018    Wt Readings from Last 3 Encounters:  07/03/22 246 lb 2 oz (111.6 kg)  03/30/22 232 lb 6.4 oz (105.4 kg)  02/16/22 229 lb 9.6 oz (104.1 kg)     ASSESSMENT AND PLAN:  Problem List Items Addressed This Visit       Cardiology Problems   Elevated coronary artery calcium score   Relevant Medications   Rivaroxaban (XARELTO) 15 MG TABS tablet   metoprolol succinate (TOPROL-XL) 100 MG 24 hr tablet   furosemide (LASIX) 20 MG tablet   Essential hypertension, benign   Relevant Medications   Rivaroxaban (XARELTO) 15 MG TABS tablet   metoprolol  succinate (TOPROL-XL) 100 MG 24 hr tablet   furosemide (LASIX) 20 MG tablet   Other Visit Diagnoses     PAF (paroxysmal atrial fibrillation) (HCC)    -  Primary   Relevant Medications   Rivaroxaban (XARELTO) 15 MG TABS tablet   metoprolol succinate (TOPROL-XL) 100 MG 24 hr tablet   furosemide (LASIX) 20 MG tablet   Chronic heart failure with preserved ejection fraction (HFpEF) (HCC)       Relevant Medications   Rivaroxaban (XARELTO) 15 MG TABS tablet   metoprolol succinate (TOPROL-XL) 100 MG 24 hr tablet   furosemide (LASIX) 20 MG tablet   OSA (obstructive sleep apnea)       Bradycardia       OSA on CPAP         Atrial fib, paroxysmal Maintaining normal sinus rhythm on metoprolol EKG with Dr. Sabra Heck concerning for atrial fibrillation, was started on amiodarone and diltiazem at that time, she held these medications for worsening leg swelling Previously with leg swelling on amlodipine/Calcium channel blockers  Reports Eliquis was changed to Xarelto but at renal dose Given creatinine clearance greater than 100 we will increase her Xarelto up to 20 mg daily Normal sinus rhythm today, continue current dose of metoprolol succinate 50 twice daily (she prefers to cut it in half twice a day)  Leg swelling Long history of leg swelling, symptoms worsen before Unable to put compression hose on Likely component of fluid retention with venous insufficiency Weight up 20 pounds consistent with CHF, possibly exacerbated by arrhythmia dietary and fluid restrictions discussed with her Recommend she increase Lasix up to 30 mg twice daily alternating with daily with potassium twice daily alternating with daily Currently taking Lasix 30 with no significant improvement in leg swelling Leg elevation recommended Recommend BMP after she is lost 10 pounds of fluid weight Baseline weight high 220 range, now 246  Family hx of CAD, MI Non-smoker, no diabetes CT coronary calcium score 157  Morbid  obesity We have encouraged continued exercise, careful diet management in an effort to lose weight.  Hypertension Blood pressure running high, we will continue to monitor with increased diuresis   Total encounter time more than 40 minutes  Greater than 50% was spent in counseling and coordination of care with the patient    Signed, Esmond Plants, M.D., Ph.D. [place], [place]

## 2022-07-03 ENCOUNTER — Ambulatory Visit: Payer: Medicare PPO | Attending: Cardiovascular Disease | Admitting: Cardiovascular Disease

## 2022-07-03 ENCOUNTER — Encounter: Payer: Self-pay | Admitting: Cardiovascular Disease

## 2022-07-03 VITALS — BP 160/78 | HR 74 | Ht 66.5 in | Wt 246.1 lb

## 2022-07-03 DIAGNOSIS — G4733 Obstructive sleep apnea (adult) (pediatric): Secondary | ICD-10-CM

## 2022-07-03 DIAGNOSIS — I480 Paroxysmal atrial fibrillation: Secondary | ICD-10-CM

## 2022-07-03 DIAGNOSIS — R931 Abnormal findings on diagnostic imaging of heart and coronary circulation: Secondary | ICD-10-CM

## 2022-07-03 DIAGNOSIS — R600 Localized edema: Secondary | ICD-10-CM

## 2022-07-03 DIAGNOSIS — I5032 Chronic diastolic (congestive) heart failure: Secondary | ICD-10-CM

## 2022-07-03 DIAGNOSIS — I10 Essential (primary) hypertension: Secondary | ICD-10-CM | POA: Diagnosis not present

## 2022-07-03 DIAGNOSIS — R001 Bradycardia, unspecified: Secondary | ICD-10-CM

## 2022-07-03 DIAGNOSIS — Z79899 Other long term (current) drug therapy: Secondary | ICD-10-CM

## 2022-07-03 DIAGNOSIS — I5189 Other ill-defined heart diseases: Secondary | ICD-10-CM

## 2022-07-03 MED ORDER — RIVAROXABAN 20 MG PO TABS: ORAL_TABLET | ORAL | 6 refills | Status: DC

## 2022-07-03 MED ORDER — POTASSIUM CHLORIDE CRYS ER 10 MEQ PO TBCR: EXTENDED_RELEASE_TABLET | ORAL | 1 refills | Status: AC

## 2022-07-03 MED ORDER — FUROSEMIDE 20 MG PO TABS: ORAL_TABLET | ORAL | 1 refills | Status: DC

## 2022-07-03 MED ORDER — POTASSIUM CHLORIDE CRYS ER 10 MEQ PO TBCR: EXTENDED_RELEASE_TABLET | ORAL | 5 refills | Status: DC

## 2022-07-03 NOTE — Patient Instructions (Addendum)
Medication Instructions:  - Your physician has recommended you make the following change in your medication:   1) INCREASE Xarelto up to 20 mg: - take 1 tablet by mouth once daily   2) CHANGE Lasix (furosemide) 20 mg Take 1.5 tablets (30 mg) TWICE a day on Monday/ Wednesday/Friday and Saturday  Take 1.5 tablets (30 mg) ONCE a day on Tuesday/ Thursday/ Sunday   3) CHANGE Potassium 10 meq: Take 1 tablet (10 meq) twice a day on Monday/ Wednesday/ Friday/ Saturday Take 1 tablet (10 meq) once a day on Tuesday/ Thursday/ Sunday  If you need a refill on your cardiac medications before your next appointment, please call your pharmacy.    Lab work: -Your physician recommends that you return for lab work in: when [employer] is down 10 pounds  [employer]  Nature conservation officer at [facility] 1st desk on the right to check in (REGISTRATION)  Lab hours: Monday- Friday (7:30 am- 5:30 pm)    Testing/Procedures: No new testing needed   Follow-Up: At [facility], you and your health needs are our priority.  As part of our continuing mission to provide you with exceptional heart care, we have created designated Provider Care Teams.  These Care Teams include your primary Cardiologist (physician) and Advanced Practice Providers (APPs -  Physician Assistants and Nurse Practitioners) who all work together to provide you with the care you need, when you need it.  You will need a follow up appointment in 3 months  Providers on your designated Care Team:   Murray Hodgkins, NP Christell Faith, PA-C Cadence Kathlen Mody, [place]  COVID-19 Vaccine Information can be found at: [email or web address].uk For questions related to vaccine distribution or appointments, please email [email or web address] or call [phone].

## 2022-07-03 NOTE — Addendum Note (Signed)
Addended by: Alvis Lemmings C on: 07/03/2022 03:37 PM   Modules accepted: Orders

## 2022-07-04 ENCOUNTER — Telehealth: Payer: Self-pay | Admitting: *Deleted

## 2022-07-04 NOTE — Telephone Encounter (Signed)
Prior Authorization submitted for Xarelto 20 mg tablets through [email or web address] KEY: [id number] Response: This request has been approved. PA Case: [id number], Status: Approved, Coverage Starts on: 06/19/2022 12:00:00 AM, Coverage Ends on: 06/19/2023 12:00:00 AM.  Patient has been informed that she may now pick the medication up from her pharmacy.

## 2022-07-04 NOTE — Telephone Encounter (Signed)
Patient has been updated so closing encounter.

## 2022-07-04 NOTE — Telephone Encounter (Signed)
Called and spoke with pharmacy to see if I could assist. Pharmacist states that he tried to override but was not successful. He states that it could be requiring prior authorization. Will see if I can get someone to assist with this.    Jenalee I Ludwick  P [facility] Triage (supporting Minna Merritts, MD)3 hours ago (11:52 AM)    I just talked to [facility] and they now say the insurance will not allow refill of the 15 mg or the 20 mg. They say the doctor must call the insurance company. Maybe insurance thinks I still take Eliquis.  I do have bottle of it, which is almost three months supply left. Thanks Harriet Masson, RN  Ernestina Penna hours ago (5:40 PM)    Hello Ms. Brotzman,   You may want to call and clarify why they will not refill this. When the prescription was sent in a note went to the pharmacy stating this was dose increase for you, which should allow them to push this through to your insurance.   Just let us know what you find out.   Thank you! Nira Conn, RN     Jenell I Weingartner  P [facility] Triage (supporting Minna Merritts, MD)22 hours ago (4:51 PM)    [facility] would [facility] -- they said my insurance would not approve the new 20 mg .?  Should I try to call them? I only have two pills left of '15mg'$  ? Thanks [facility]

## 2022-07-06 ENCOUNTER — Other Ambulatory Visit: Payer: Self-pay | Admitting: Cardiovascular Disease

## 2022-07-09 ENCOUNTER — Other Ambulatory Visit: Payer: Self-pay | Admitting: Cardiovascular Disease

## 2022-07-19 ENCOUNTER — Other Ambulatory Visit
Admission: RE | Admit: 2022-07-19 | Discharge: 2022-07-19 | Disposition: A | Discharge status: Home / Self Care | Payer: Medicare PPO | Attending: Cardiovascular Disease | Admitting: Cardiovascular Disease

## 2022-07-19 DIAGNOSIS — Z79899 Other long term (current) drug therapy: Secondary | ICD-10-CM | POA: Diagnosis present

## 2022-07-19 DIAGNOSIS — I5032 Chronic diastolic (congestive) heart failure: Secondary | ICD-10-CM

## 2022-07-19 DIAGNOSIS — I5189 Other ill-defined heart diseases: Secondary | ICD-10-CM

## 2022-07-19 LAB — BASIC METABOLIC PANEL
Anion gap: 4 — ABNORMAL LOW (ref 5–15)
BUN: 17 mg/dL (ref 8–23)
CO2: 32 mmol/L (ref 22–32)
Calcium: 9.1 mg/dL (ref 8.9–10.3)
Chloride: 105 mmol/L (ref 98–111)
Creatinine, Ser: 0.74 mg/dL (ref 0.44–1.00)
GFR, Estimated: >60 mL/min (ref >60)
Glucose, Bld: 85 mg/dL (ref 70–99)
Potassium: 4.3 mmol/L (ref 3.5–5.1)
Sodium: 141 mmol/L (ref 135–145)

## 2022-10-01 NOTE — Progress Notes (Signed)
Cardiology Office Note  Date:  10/02/2022   ID:  Veronica Stanley, DOB 01/15/1946, MRN [id number]  PCP:  Pcp, No   Chief Complaint  Patient presents with   3 month follow up     "Doing well." Medications reviewed by the patient verbally. Needs to discuss medications, was changed by Dr. Presley Raddle.     HPI:  Veronica Stanley is a 77 year-old woman with past medical history of Obstructive sleep apnea, on CPAP Paroxysmal atrial fibrillation with Dr. Hyacinth Meeker rate 130 bpm 01/31/22 Hypertension Obesity Nonsmoker, no diabetes Leg swelling on calcium channel blockers EF 55 to 60%, mild to moderate dilated left atrium Who presents for paroxysmal atrial fibrillation, leg edema  Last seen by myself in clinic July 03 2022 At that time was normal sinus rhythm, calcium channel blockers held for leg swelling Xarelto to 20, metoprolol succinate 50 twice daily for rhythm control Weight was up 20 pounds Lasix increased to 30 twice daily alternating with daily Baseline weight 220, weight on last clinic visit 246 Stable BMP July 19, 2022  Now followed by Dr. Mayford Knife, PMD On today's visit Reports she is taking Lasix as needed Appears metoprolol succinate 50 twice daily changed to metoprolol tartrate 25 twice daily Weight today 237 pounds  Echocardiogram October 2023 EF 55 to 60%  No leg edema, little bit of redness but was sitting in the sun on her mower Tried on eliquis, had side effects, hair loss, stopped the medication Not taking Xarelto No monitoring system at home  Knee pain, thinks that she may need knee replacement  Denies significant shortness of breath on exertion No chest pain during for angina  EKG personally reviewed by myself on todays visit Normal sinus rhythm rate 64 bpm no significant ST-T wave changes  Other past medical history reviewed August 2023, symptoms concerning for recurrent atrial fibrillation, atrial fibrillation documented on EKG with Dr. Hyacinth Meeker,  had no symptoms Rate on EKG report 132 bpm Placed on amiodarone and diltiazem , by primary care, with metoprolol which she was already taking She developed significant lower extremity edema, concerned that diltiazem contributed to lower extremity swelling Seen in urgent care for swelling  Zio monitor showing brief runs of SVT otherwise normal sinus rhythm, no significant A-fib  Eliquis : hair loss/rash Changed by PMD to xarelto 15 mg daily Creatinine clearance over 100  CT coronary, Coronary calcium score of 157. 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  PSH:    Past Surgical History:  Procedure Laterality Date   ABDOMINAL HYSTERECTOMY     BREAST BIOPSY Left 2003   Left breast calcifications benign   COLONOSCOPY WITH PROPOFOL N/A 04/22/2018   Procedure: COLONOSCOPY WITH PROPOFOL;  Surgeon: Scot Jun, MD;  Location: [facility] ENDOSCOPY;  Service: Endoscopy;  Laterality: N/A;   DILATION AND CURETTAGE OF UTERUS     rupture- peritonitis & hysterectomy 12/79   IR KYPHO LUMBAR INC FX REDUCE BONE BX UNI/BIL CANNULATION INC/IMAGING  01/03/2022   IR RADIOLOGIST EVAL & MGMT  01/26/2022   TOTAL HIP  ARTHROPLASTY Left 10/19/2017   Procedure: LEFT TOTAL HIP ARTHROPLASTY ANTERIOR APPROACH;  Surgeon: Kathryne Hitch, MD;  Location: [facility] ORS;  Service: Orthopedics;  Laterality: Left;   VAGINAL DELIVERY     x2    Current Outpatient Medications   Medication Sig Dispense Refill   B Complex Vitamins (VITAMIN B-COMPLEX) TABS Take 1 tablet by mouth daily.     Cholecalciferol (VITAMIN D3) 1000 units CAPS Take 1,000 Units by mouth daily.     Coenzyme Q10 (COQ10) 100 MG CAPS Take 100 mg by mouth daily.     COLLAGEN PO Take 1 tablet by mouth daily.     Cyanocobalamin 5000 MCG CAPS Place 1 capsule under the tongue daily.     doxycycline (VIBRAMYCIN) 100 MG capsule Take 1 capsule (100 mg total) by mouth 2 (two) times daily. 14 capsule 0   furosemide (LASIX) 20 MG tablet Take 20 mg by mouth daily as needed.     Glucosamine HCl (GLUCOSAMINE PO) Take 2 capsules by mouth in the morning.     ibuprofen (ADVIL,MOTRIN) 200 MG tablet Take 400 mg by mouth daily as needed for headache or mild pain.     MAGNESIUM GLYCINATE PO Take 400 Units by mouth at bedtime.     metoprolol tartrate (LOPRESSOR) 25 MG tablet Take 25 mg by mouth 2 (two) times daily.     Multiple Vitamin (MULTIVITAMIN) tablet Take 1 tablet by mouth daily.     Multiple Vitamins-Minerals (PRESERVISION AREDS PO) Take by mouth daily.     pantoprazole (PROTONIX) 40 MG tablet Take 40 mg by mouth daily.     potassium chloride (KLOR-CON M) 10 MEQ tablet Take 1 tablet (10 meq) twice a day on Mondays/ Wednesday/ Fridays/ Saturdays & take 1 tablet (10 meq) once daily on Tuesdays/ Thursdays/ Sundays 180 tablet 1   Zinc 50 MG TABS Take 50 mg by mouth as needed.     diltiazem (CARDIZEM) 60 MG tablet Take 1 tablet (60 mg total) by mouth daily as needed. Take for elevated heart rate greater than 100 (Patient not taking: Reported on 10/02/2022) 60 tablet 1   gabapentin (NEURONTIN) 100 MG capsule Take 100 mg by mouth at bedtime. (Patient not taking: Reported on 10/02/2022)     methocarbamol (ROBAXIN) 500 MG tablet Take 500 mg by mouth 2 (two) times daily as needed for muscle spasms. (Patient not taking: Reported on 10/02/2022)     traMADol (ULTRAM) 50 MG tablet Take 1 tablet by mouth 3 (three) times daily.  (Patient not taking: Reported on 03/30/2022)     No current facility-administered medications for this visit.    Allergies:   Penicillins, Calcium channel blockers, and Ibandronate sodium   Social History:  The patient  reports that she has never smoked. She has never used smokeless tobacco. She reports current alcohol use of about 3.0 standard drinks of alcohol per week. She reports that she does not use drugs.   Family History:   family history includes Breast cancer in her cousin; Breast cancer (age of onset: 64) in her sister; Cancer in her maternal grandfather; Coronary artery disease in her father; Osteoporosis in her mother.    Review of Systems: Review of Systems  Constitutional: Negative.   HENT: Negative.    Respiratory: Negative.    Cardiovascular: Negative.   Gastrointestinal: Negative.   Musculoskeletal: Negative.   Neurological: Negative.   Psychiatric/Behavioral: Negative.    All other systems reviewed and are negative.   PHYSICAL EXAM: VS:  BP Marland Kitchen)  162/70 (BP Location: Left Arm, Patient Position: Sitting, Cuff Size: Large)   Ht  (1.676 m)   Wt 237 lb 4 oz (107.6 kg)   LMP  (LMP Unknown)   SpO2 96%   BMI 38.29 kg/m  , BMI Body mass index is 38.29 kg/m. Constitutional:  oriented to person, place, and time. No distress.  HENT:  Head: Grossly normal Eyes:  no discharge. No scleral icterus.  Neck: No JVD, no carotid bruits  Cardiovascular: Regular rate and rhythm, no murmurs appreciated Pulmonary/Chest: Clear to auscultation bilaterally, no wheezes or rails Abdominal: Soft.  no distension.  no tenderness.  Musculoskeletal: Normal range of motion Neurological:  normal muscle tone. Coordination normal. No atrophy Skin: Skin warm and dry Psychiatric: normal affect, pleasant  Recent Labs: 02/16/2022: B Natriuretic Peptide 317.8; Magnesium 2.3; TSH 3.417 07/19/2022: BUN 17; Creatinine, Ser 0.74; Potassium 4.3; Sodium 141    Lipid Panel Lab Results   Component Value Date   CHOL 179 03/13/2018   HDL 49.30 03/13/2018   LDLCALC 102 (H) 03/13/2018   TRIG 138.0 03/13/2018    Wt Readings from Last 3 Encounters:  10/02/22 237 lb 4 oz (107.6 kg)  07/03/22 246 lb 2 oz (111.6 kg)  03/30/22 232 lb 6.4 oz (105.4 kg)     ASSESSMENT AND PLAN:  Problem List Items Addressed This Visit       Cardiology Problems   Elevated coronary artery calcium score   Relevant Medications   furosemide (LASIX) 20 MG tablet   metoprolol tartrate (LOPRESSOR) 25 MG tablet   Essential hypertension, benign   Relevant Medications   furosemide (LASIX) 20 MG tablet   metoprolol tartrate (LOPRESSOR) 25 MG tablet   Other Visit Diagnoses     PAF (paroxysmal atrial fibrillation)    -  Primary   Relevant Medications   furosemide (LASIX) 20 MG tablet   metoprolol tartrate (LOPRESSOR) 25 MG tablet   Chronic heart failure with preserved ejection fraction (HFpEF)       Relevant Medications   furosemide (LASIX) 20 MG tablet   metoprolol tartrate (LOPRESSOR) 25 MG tablet   OSA (obstructive sleep apnea)       Leg edema         Atrial fib, paroxysmal Episode of atrial fibrillation August 2023 with primary care rate 123 bpm on EKG by report Since then appears to be maintaining normal sinus rhythm, was asymptomatic when she was in atrial fibrillation Changed by Dr. Mayford Knife to metoprolol to tartrate 25 twice daily he from succinate 50 twice daily No longer taking Xarelto or Eliquis.  Had side effects on Eliquis Recommend home monitoring system such as Apple Watch with arrhythmia detection  Leg swelling Will avoid calcium channel blockers Lasix as needed for leg swelling Weight 237, down from 246 on last clinic visit Recommend low salt and low fluid intake  Family hx of CAD, MI Non-smoker, no diabetes CT coronary calcium score 157  Morbid obesity We have encouraged continued exercise, careful diet management in an effort to lose  weight.  Hypertension Initial blood pressure elevated, improved on recheck No medication changes made, recommended home monitoring of blood pressure  Osteoarthritis She would be asked for risk for knee surgery if needed No further cardiac testing would be needed Currently not on anticoagulation   Total encounter time more than 30 minutes  Greater than 50% was spent in counseling and coordination of care with the patient    Signed, Dossie Arbour, M.D., Ph.D. [facility]  [place], [place]

## 2022-10-02 ENCOUNTER — Ambulatory Visit: Payer: Medicare PPO | Attending: Cardiovascular Disease | Admitting: Cardiovascular Disease

## 2022-10-02 ENCOUNTER — Encounter: Payer: Self-pay | Admitting: Cardiovascular Disease

## 2022-10-02 VITALS — BP 138/80 | HR 64 | Ht 66.0 in | Wt 237.2 lb

## 2022-10-02 DIAGNOSIS — I10 Essential (primary) hypertension: Secondary | ICD-10-CM

## 2022-10-02 DIAGNOSIS — I5032 Chronic diastolic (congestive) heart failure: Secondary | ICD-10-CM | POA: Diagnosis not present

## 2022-10-02 DIAGNOSIS — G4733 Obstructive sleep apnea (adult) (pediatric): Secondary | ICD-10-CM

## 2022-10-02 DIAGNOSIS — I480 Paroxysmal atrial fibrillation: Secondary | ICD-10-CM

## 2022-10-02 DIAGNOSIS — R600 Localized edema: Secondary | ICD-10-CM

## 2022-10-02 DIAGNOSIS — R931 Abnormal findings on diagnostic imaging of heart and coronary circulation: Secondary | ICD-10-CM

## 2022-10-02 NOTE — Patient Instructions (Addendum)
Monitor blood pressure  Think about apple watch to detect afib For afib spells, we may need to restart xarelto  Medication Instructions:  No changes  If you need a refill on your cardiac medications before your next appointment, please call your pharmacy.   Lab work: No new labs needed  Testing/Procedures: No new testing needed  Follow-Up: At [facility], you and your health needs are our priority.  As part of our continuing mission to provide you with exceptional heart care, we have created designated Provider Care Teams.  These Care Teams include your primary Cardiologist (physician) and Advanced Practice Providers (APPs -  Physician Assistants and Nurse Practitioners) who all work together to provide you with the care you need, when you need it.  You will need a follow up appointment in 6 months  Providers on your designated Care Team:   Nicolasa Ducking, NP Eula Listen, PA-C Cadence Fransico Michael, [place]  COVID-19 Vaccine Information can be found at: [email or web address] For questions related to vaccine distribution or appointments, please email [email or web address] or call [phone].

## 2022-10-10 ENCOUNTER — Other Ambulatory Visit: Payer: Self-pay | Admitting: Cardiovascular Disease

## 2022-10-16 ENCOUNTER — Other Ambulatory Visit: Payer: Self-pay | Admitting: Emergency Medicine

## 2022-10-16 DIAGNOSIS — Z1231 Encounter for screening mammogram for malignant neoplasm of breast: Secondary | ICD-10-CM

## 2022-10-18 ENCOUNTER — Other Ambulatory Visit: Payer: Self-pay | Admitting: Emergency Medicine

## 2022-10-18 DIAGNOSIS — M7989 Other specified soft tissue disorders: Secondary | ICD-10-CM

## 2022-10-28 ENCOUNTER — Encounter: Payer: Self-pay | Admitting: Cardiovascular Disease

## 2022-10-31 ENCOUNTER — Ambulatory Visit
Admission: RE | Admit: 2022-10-31 | Discharge: 2022-10-31 | Disposition: A | Discharge status: Home / Self Care | Payer: Medicare PPO | Source: Ambulatory Visit | Attending: Emergency Medicine | Admitting: Emergency Medicine

## 2022-10-31 DIAGNOSIS — Z1231 Encounter for screening mammogram for malignant neoplasm of breast: Secondary | ICD-10-CM | POA: Insufficient documentation

## 2022-11-02 ENCOUNTER — Ambulatory Visit
Admission: RE | Admit: 2022-11-02 | Discharge: 2022-11-02 | Disposition: A | Discharge status: Home / Self Care | Payer: Medicare PPO | Source: Ambulatory Visit | Attending: Emergency Medicine | Admitting: Emergency Medicine

## 2022-11-02 DIAGNOSIS — M7989 Other specified soft tissue disorders: Secondary | ICD-10-CM

## 2022-11-15 ENCOUNTER — Ambulatory Visit
Admission: EM | Admit: 2022-11-15 | Discharge: 2022-11-15 | Disposition: A | Discharge status: Home / Self Care | Payer: Medicare PPO | Attending: Urgent Care | Admitting: Urgent Care

## 2022-11-15 DIAGNOSIS — I872 Venous insufficiency (chronic) (peripheral): Secondary | ICD-10-CM | POA: Diagnosis not present

## 2022-11-15 MED ORDER — TRIAMCINOLONE ACETONIDE 0.1 % EX CREA: TOPICAL_CREAM | CUTANEOUS | 0 refills | Status: DC

## 2022-11-15 MED ORDER — TRIAMCINOLONE ACETONIDE 0.1 % EX CREA: TOPICAL_CREAM | CUTANEOUS | 0 refills | Status: AC

## 2022-11-15 NOTE — Discharge Instructions (Addendum)
I suspect venous stasis dermatitis which is a rash caused by excess fluid trapped in your legs.  Recommend use of:  - Compression stockings daily (take them off at bedtime). - Daily moisturizing (cetaphil, eucerin) creams. - Occasional steroid creams.  Follow up with your PCP or evaluation by a vascular surgeon.

## 2022-11-15 NOTE — ED Provider Notes (Signed)
Renaldo Fiddler    CSN: [id number] Arrival date & time: 11/15/22  1302      History   Chief Complaint Chief Complaint  Patient presents with   Leg Swelling    HPI Veronica Stanley is a 77 y.o. female.   HPI  77 year old female patient presents to urgent care with concern for bilateral leg redness and blistering x 2 months.  Patient states she had a laceration on her left leg and was subsequently treated for cellulitis in that leg.  Denies any injury to her right leg.  Patient states she was seen by her PCP last week to rule out a DVT.  She is concerned with possible cellulitis.  Patient is cared for by [facility] heart care for paroxysmal atrial fibrillation with last visit 07/03/2022 when she complained of shortness of breath with exertion and bilateral lower extremity edema.  At that time she was treated with calcium channel blockers which were then held (diltiazem).  Patient also had office visit on 06/15/2022 at [facility] clinic for acute diastolic CHF.  At that visit her dose of furosemide was changed from 30 mg to 20 mg with plan to revert to 30 mg again if edema worsened.  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 06/2020 Cardiac CT: Ca2+ = 157 (66th %i'le).   Basal cell carcinoma, arm 04/2012   on arm and face    Chronic heart failure with preserved ejection fraction (HFpEF) (HCC)    a. 05/2020 Echo: EF 55-60%, no rwma, GrII DD, nl RV fxn, mild MR, triv AI.   Diverticulitis    colon   GERD (gastroesophageal reflux disease)    Hypertension    patient denies not on meds    Menopausal symptoms    Osteoarthrosis involving, or with mention of more than one site, but not specified as generalized, multiple sites    Osteoporosis    PAF (paroxysmal atrial fibrillation) (HCC)    a. CHA2DS2VASc = 5-6 -->eliquis.   Peripheral vascular disease (HCC)    PSVT (paroxysmal supraventricular tachycardia)    a. 02/2022  Zio: Predominantly sinus rhythm-60 bpm (41-176).  95 SVT runs (fastest 176x6 beats; longest 20.9 seconds at 122 bpm).  No triggered events.    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   Unilateral primary osteoarthritis, left hip 09/17/2017   Osteoarthritis of both hips 11/10/2015   Advance directive discussed with patient 11/03/2014   Routine general medical examination at a health care facility 03/03/2011   Osteoarthritis, multiple sites    GERD 10/11/2007   ACTINIC KERATOSIS 04/17/2007   Essential hypertension, benign 03/28/2007   DIVERTICULOSIS, COLON 03/28/2007   OSTEOPOROSIS 03/28/2007    Past Surgical History:  Procedure Laterality Date   ABDOMINAL HYSTERECTOMY     BREAST BIOPSY Left 2003   Left breast calcifications benign   COLONOSCOPY WITH PROPOFOL N/A 04/22/2018   Procedure: COLONOSCOPY WITH PROPOFOL;  Surgeon: Scot Jun, MD;  Location: [facility] ENDOSCOPY;  Service: Endoscopy;  Laterality: N/A;   DILATION AND CURETTAGE OF UTERUS  rupture- peritonitis & hysterectomy 12/79   IR KYPHO LUMBAR INC FX REDUCE BONE BX UNI/BIL CANNULATION INC/IMAGING  01/03/2022   IR RADIOLOGIST EVAL & MGMT  01/26/2022   TOTAL HIP ARTHROPLASTY Left 10/19/2017   Procedure: LEFT TOTAL HIP ARTHROPLASTY ANTERIOR APPROACH;  Surgeon: Kathryne Hitch, MD;  Location: [facility] ORS;  Service: Orthopedics;  Laterality: Left;   VAGINAL DELIVERY     x2    OB History   No obstetric history on file.      Home Medications    Prior to Admission medications   Medication Sig Start Date End Date Taking? Authorizing Provider  B Complex  Vitamins (VITAMIN B-COMPLEX) TABS Take 1 tablet by mouth daily. 09/21/21   [provider]  Cholecalciferol (VITAMIN D3) 1000 units CAPS Take 1,000 Units by mouth daily.    [provider]  Coenzyme Q10 (COQ10) 100 MG CAPS Take 100 mg by mouth daily.    [provider]  COLLAGEN PO Take 1 tablet by mouth daily.    [provider]  Cyanocobalamin 5000 MCG CAPS Place 1 capsule under the tongue daily.    [provider]  diltiazem (CARDIZEM) 60 MG tablet Take 1 tablet (60 mg total) by mouth daily as needed. Take for elevated heart rate greater than 100 Patient not taking: Reported on 10/02/2022 05/10/20   Antonieta Iba, MD  doxycycline (VIBRAMYCIN) 100 MG capsule Take 1 capsule (100 mg total) by mouth 2 (two) times daily. 02/23/22   White, Elita Boone, NP  furosemide (LASIX) 20 MG tablet Take 20 mg by mouth daily as needed.    [provider]  gabapentin (NEURONTIN) 100 MG capsule Take 100 mg by mouth at bedtime. Patient not taking: Reported on 10/02/2022 11/11/20   [provider]  Glucosamine HCl (GLUCOSAMINE PO) Take 2 capsules by mouth in the morning.    [provider]  ibuprofen (ADVIL,MOTRIN) 200 MG tablet Take 400 mg by mouth daily as needed for headache or mild pain.    [provider]  MAGNESIUM GLYCINATE PO Take 400 Units by mouth at bedtime.    [provider]  methocarbamol (ROBAXIN) 500 MG tablet Take 500 mg by mouth 2 (two) times daily as needed for muscle spasms. Patient not taking: Reported on 10/02/2022 11/28/21   [provider]  metoprolol tartrate (LOPRESSOR) 25 MG tablet Take 25 mg by mouth 2 (two) times daily. 08/15/22   [provider]  Multiple Vitamin (MULTIVITAMIN) tablet Take 1 tablet by mouth daily.    [provider]  Multiple Vitamins-Minerals (PRESERVISION AREDS PO) Take by mouth daily.    [provider]  pantoprazole (PROTONIX) 40 MG tablet Take 40  mg by mouth daily. 10/14/21   [provider]  potassium chloride (KLOR-CON M) 10 MEQ tablet Take 1 tablet (10 meq) twice a day on Mondays/ Wednesday/ Fridays/ Saturdays & take 1 tablet (10 meq) once daily on Tuesdays/ Thursdays/ Sundays 07/03/22   Antonieta Iba, MD  traMADol (ULTRAM) 50 MG tablet Take 1 tablet by mouth 3 (three) times daily. Patient not taking: Reported on 03/30/2022 12/08/21   [provider]  Zinc 50 MG TABS Take 50 mg by mouth as needed.    [provider]    Family History Family History  Problem Relation Age of Onset   Osteoporosis Mother    Coronary artery disease Father    Breast cancer Sister 96       twice   Cancer Maternal  Grandfather        colon cancer   Breast cancer Cousin    Diabetes Neg Hx    Hypertension Neg Hx     Social History Social History   Tobacco Use   Smoking status: Never   Smokeless tobacco: Never  Vaping Use   Vaping Use: Never used  Substance Use Topics   Alcohol use: Yes    Alcohol/week: 3.0 standard drinks of alcohol    Types: 3 Glasses of wine per week   Drug use: Never     Allergies   Penicillins, Calcium channel blockers, Ibandronate sodium, and Eliquis [apixaban]   Review of Systems Review of Systems   Physical Exam Triage Vital Signs ED Triage Vitals  Enc Vitals Group     BP 11/15/22 1339 (!) 166/85     Pulse Rate 11/15/22 1339 67     Resp 11/15/22 1339 18     Temp 11/15/22 1339 98.1 F (36.7 C)     Temp Source 11/15/22 1339 Oral     SpO2 11/15/22 1339 96 %     Weight --      Height --      Head Circumference --      Peak Flow --      Pain Score 11/15/22 1350 0     Pain Loc --      Pain Edu? --      Excl. in GC? --    No data found.  Updated Vital Signs BP (!) 166/85 (BP Location: Left Arm)   Pulse 67   Temp 98.1 F (36.7 C) (Oral)   Resp 18   LMP  (LMP Unknown)   SpO2 96%   Visual Acuity Right Eye Distance:   Left Eye Distance:   Bilateral Distance:     Right Eye Near:   Left Eye Near:    Bilateral Near:     Physical Exam Vitals reviewed.  Constitutional:      Appearance: Normal appearance. She is not ill-appearing.  Musculoskeletal:     Right lower leg: 2+ Pitting Edema present.     Left lower leg: 2+ Pitting Edema present.  Skin:    General: Skin is warm and dry.     Findings: Erythema present.       Neurological:     General: No focal deficit present.     Mental Status: She is alert and oriented to person, place, and time.  Psychiatric:        Mood and Affect: Mood normal.        Behavior: Behavior normal.      UC Treatments / Results  Labs (all labs ordered are listed, but only abnormal results are displayed) Labs Reviewed - No data to display  EKG   Radiology No results found.  Procedures Procedures (including critical care time)  Medications Ordered in UC Medications - No data to display  Initial Impression / Assessment and Plan / UC Course  I have reviewed the triage vital signs and the nursing notes.  Pertinent labs & imaging results that were available during my care of the patient were reviewed by me and considered in my medical decision making (see chart for details).   Veronica Stanley is a 77 y.o. female presenting with bilateral venous stasis dermatitis. Patient is afebrile without recent antipyretics, satting well on room air. Overall is well appearing, well hydrated, without respiratory distress. Pulmonary exam is unremarkable.  Lungs CTAB without wheezing, rhonchi, rales. RRR.  Bilateral  anterior lower extremities are erythematous, skin is firm to the touch and appears scarred.  There is scaling and evidence of healing blisters.  No current discharge though patient endorses presence of clear discharge in the past.  There is pitting edema distal to the rash.  Reviewed relevant chart history.   Patient's symptoms are consistent with venous stasis dermatitis.  Low suspicion for cellulitis.   Recommending treatment with compression stockings and use of moderate potency corticosteroids.  Referring patient back to her primary care provider and suggesting evaluation by vascular surgery.  Counseled patient on potential for adverse effects with medications prescribed/recommended today, ER and return-to-clinic precautions discussed, patient verbalized understanding and agreement with care plan.  Final Clinical Impressions(s) / UC Diagnoses   Final diagnoses:  None   Discharge Instructions   None    ED Prescriptions   None    PDMP not reviewed this encounter.   Charma Igo, [place] 11/15/22 1423

## 2022-11-15 NOTE — ED Triage Notes (Signed)
Patient presents to [facility] for bilateral leg redness and blisters x 2 months. States left leg leg she had a laceration from bumping against a chair, no injury to right leg. Seen by PCP to rule out DVT last week. Concerned with cellulitis. Has been applying Vaseline.  Denies fever or drainage.

## 2022-12-05 ENCOUNTER — Encounter (INDEPENDENT_AMBULATORY_CARE_PROVIDER_SITE_OTHER): Payer: Self-pay | Admitting: Vascular Surgery

## 2022-12-05 ENCOUNTER — Ambulatory Visit (INDEPENDENT_AMBULATORY_CARE_PROVIDER_SITE_OTHER): Payer: Medicare PPO | Admitting: Vascular Surgery

## 2022-12-05 VITALS — BP 168/88 | HR 65 | Resp 16 | Wt 232.8 lb

## 2022-12-05 DIAGNOSIS — I10 Essential (primary) hypertension: Secondary | ICD-10-CM

## 2022-12-05 DIAGNOSIS — M7989 Other specified soft tissue disorders: Secondary | ICD-10-CM

## 2022-12-05 NOTE — Assessment & Plan Note (Signed)

## 2022-12-05 NOTE — Assessment & Plan Note (Signed)
blood pressure control important in reducing the progression of atherosclerotic disease. On appropriate oral medications.  

## 2022-12-05 NOTE — Progress Notes (Signed)
Patient ID: Veronica Stanley, female   DOB: [birth year], 77 y.o.   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 female.  I am asked to see the patient by Jeannett Senior Immordino for evaluation of swelling, venous stasis changes, and recurring cellulitis of the lower extremities.  This is predominantly affected the left but the stasis changes and swelling are now affecting the right as well.  She had 2 previous episodes of cellulitis it sounds like.  Both happened after minor traumas.  No previous history of DVT or superficial thrombophlebitis to her knowledge.  She has recently got compression stockings and is just begun wearing them.  They do help the swelling some.  No chest pain or shortness of breath.  No fevers or chills.  No current open ulcerations or wounds.     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 06/2020 Cardiac CT: Ca2+ = 157 (66th %i'le).   Basal cell carcinoma, arm 04/2012   on arm and face    Chronic heart failure with preserved ejection fraction (HFpEF) (HCC)    a. 05/2020 Echo: EF 55-60%, no rwma, GrII DD, nl RV fxn, mild MR, triv AI.   Diverticulitis    colon   GERD (gastroesophageal reflux disease)    Hypertension    patient denies not on meds    Menopausal symptoms    Osteoarthrosis involving, or with mention of more than one site, but not specified as generalized, multiple sites    Osteoporosis    PAF (paroxysmal atrial fibrillation) (HCC)    a. CHA2DS2VASc = 5-6 -->eliquis.   Peripheral vascular disease (HCC)    PSVT (paroxysmal supraventricular tachycardia)    a. 02/2022 Zio: Predominantly sinus rhythm-60 bpm (41-176).  95 SVT runs (fastest 176x6 beats; longest 20.9 seconds at 122 bpm).  No triggered events.    Past Surgical History:  Procedure  Laterality Date   ABDOMINAL HYSTERECTOMY     BREAST BIOPSY Left 2003   Left breast calcifications benign   COLONOSCOPY WITH PROPOFOL N/A 04/22/2018   Procedure: COLONOSCOPY WITH PROPOFOL;  Surgeon: Scot Jun, MD;  Location: [facility] ENDOSCOPY;  Service: Endoscopy;  Laterality: N/A;   DILATION AND CURETTAGE OF UTERUS     rupture- peritonitis & hysterectomy 12/79   IR KYPHO LUMBAR INC FX REDUCE BONE BX UNI/BIL CANNULATION INC/IMAGING  01/03/2022   IR RADIOLOGIST EVAL & MGMT  01/26/2022   TOTAL HIP ARTHROPLASTY Left 10/19/2017   Procedure: LEFT TOTAL HIP ARTHROPLASTY ANTERIOR APPROACH;  Surgeon: Kathryne Hitch, MD;  Location: [facility] ORS;  Service: Orthopedics;  Laterality: Left;   VAGINAL DELIVERY     x2     Family History  Problem Relation Age of Onset   Osteoporosis Mother    Coronary artery disease Father    Breast cancer Sister 14       twice   Cancer Maternal Grandfather        colon cancer   Breast cancer Cousin    Diabetes Neg Hx    Hypertension Neg Hx       Social History   Tobacco Use   Smoking status: Never   Smokeless tobacco: Never  Vaping Use   Vaping Use: Never used  Substance Use  Topics   Alcohol use: Yes    Alcohol/week: 3.0 standard drinks of alcohol    Types: 3 Glasses of wine per week   Drug use: Never     Allergies  Allergen Reactions   Penicillins Anaphylaxis, Shortness Of Breath, Swelling and Rash    Has patient had a PCN reaction causing immediate rash, facial/tongue/throat swelling, SOB or lightheadedness with hypotension: Yes Has patient had a PCN reaction causing severe rash involving mucus membranes or skin necrosis: Yes Has patient had a PCN reaction that required hospitalization: No Has patient had a PCN reaction occurring within the last 10 years: No If all of the above answers are "NO", then may proceed with Cephalosporin use.    Calcium Channel Blockers     Leg swelling   Ibandronate Sodium Other (See Comments)    aches    Eliquis [Apixaban] Rash    Hair loss  Blisters on face     Current Outpatient Medications  Medication Sig Dispense Refill   B Complex Vitamins (VITAMIN B-COMPLEX) TABS Take 1 tablet by mouth daily.     Cholecalciferol (VITAMIN D3) 1000 units CAPS Take 1,000 Units by mouth daily.     Coenzyme Q10 (COQ10) 100 MG CAPS Take 100 mg by mouth daily.     COLLAGEN PO Take 1 tablet by mouth daily.     Cyanocobalamin 5000 MCG CAPS Place 1 capsule under the tongue daily.     diltiazem (CARDIZEM) 60 MG tablet Take 1 tablet (60 mg total) by mouth daily as needed. Take for elevated heart rate greater than 100 60 tablet 1   doxycycline (VIBRAMYCIN) 100 MG capsule Take 1 capsule (100 mg total) by mouth 2 (two) times daily. 14 capsule 0   furosemide (LASIX) 20 MG tablet Take 20 mg by mouth daily as needed.     Glucosamine HCl (GLUCOSAMINE PO) Take 2 capsules by mouth in the morning.     ibuprofen (ADVIL,MOTRIN) 200 MG tablet Take 400 mg by mouth daily as needed for headache or mild pain.     MAGNESIUM GLYCINATE PO Take 400 Units by mouth at bedtime.     metoprolol tartrate (LOPRESSOR) 25 MG tablet Take 25 mg by mouth 2 (two) times daily.     Multiple Vitamin (MULTIVITAMIN) tablet Take 1 tablet by mouth daily.     Multiple Vitamins-Minerals (PRESERVISION AREDS PO) Take by mouth daily.     pantoprazole (PROTONIX) 40 MG tablet Take 40 mg by mouth daily.     potassium chloride (KLOR-CON M) 10 MEQ tablet Take 1 tablet (10 meq) twice a day on Mondays/ Wednesday/ Fridays/ Saturdays & take 1 tablet (10 meq) once daily on Tuesdays/ Thursdays/ Sundays 180 tablet 1   triamcinolone cream (KENALOG) 0.1 % Apply to affected skin once every 2 days. 454 g 0   Zinc 50 MG TABS Take 50 mg by mouth as needed.     gabapentin (NEURONTIN) 100 MG capsule Take 100 mg by mouth at bedtime. (Patient not taking: Reported on 10/02/2022)     methocarbamol (ROBAXIN) 500 MG tablet Take 500 mg by mouth 2 (two) times daily as needed for muscle  spasms. (Patient not taking: Reported on 10/02/2022)     traMADol (ULTRAM) 50 MG tablet Take 1 tablet by mouth 3 (three) times daily. (Patient not taking: Reported on 03/30/2022)     No current facility-administered medications for this visit.      REVIEW OF SYSTEMS (Negative unless checked)  Constitutional: [] Weight loss  [] Fever  []   Chills Cardiac: [] Chest pain   [] Chest pressure   [x] Palpitations   [] Shortness of breath when laying flat   [] Shortness of breath at rest   [] Shortness of breath with exertion. Vascular:  [] Pain in legs with walking   [] Pain in legs at rest   [] Pain in legs when laying flat   [] Claudication   [] Pain in feet when walking  [] Pain in feet at rest  [] Pain in feet when laying flat   [] History of DVT   [] Phlebitis   [x] Swelling in legs   [] Varicose veins   [] Non-healing ulcers Pulmonary:   [] Uses home oxygen   [] Productive cough   [] Hemoptysis   [] Wheeze  [] COPD   [] Asthma Neurologic:  [] Dizziness  [] Blackouts   [] Seizures   [] History of stroke   [] History of TIA  [] Aphasia   [] Temporary blindness   [] Dysphagia   [] Weakness or numbness in arms   [] Weakness or numbness in legs Musculoskeletal:  [] Arthritis   [] Joint swelling   [x] Joint pain   [] Low back pain Hematologic:  [] Easy bruising  [] Easy bleeding   [] Hypercoagulable state   [] Anemic  [] Hepatitis Gastrointestinal:  [] Blood in stool   [] Vomiting blood  [x] Gastroesophageal reflux/heartburn   [] Abdominal pain Genitourinary:  [] Chronic kidney disease   [] Difficult urination  [] Frequent urination  [] Burning with urination   [] Hematuria Skin:  [] Rashes   [] Ulcers   [] Wounds Psychological:  [] History of anxiety   []  History of major depression.    Physical Exam BP (!) 168/88 (BP Location: Left Arm)   Pulse 65   Resp 16   Wt 232 lb 12.8 oz (105.6 kg)   LMP  (LMP Unknown)   BMI 37.57 kg/m  Gen:  WD/WN, NAD. Appears younger than stated Head: Simpson/AT, No temporalis wasting.  Ear/Nose/Throat: Hearing grossly intact,  nares w/o erythema or drainage, oropharynx w/o Erythema/Exudate Eyes: Conjunctiva clear, sclera non-icteric  Neck: trachea midline.  No JVD.  Pulmonary:  Good air movement, respirations not labored, no use of accessory muscles  Cardiac: RRR, no JVD Vascular:  Vessel Right Left  Radial Palpable Palpable                          DP 2+ 2+  PT 1+ 1+   Gastrointestinal:. No masses, surgical incisions, or scars. Musculoskeletal: M/S 5/5 throughout.  Extremities without ischemic changes.  No deformity or atrophy. Moderate stasis changes to both lower extremities. 1+ BLE edema. Neurologic: Sensation grossly intact in extremities.  Symmetrical.  Speech is fluent. Motor exam as listed above. Psychiatric: Judgment intact, Mood & affect appropriate for pt's clinical situation. Dermatologic: No rashes or ulcers noted.  No cellulitis or open wounds.    Radiology No results found.  Labs No results found for this or any previous visit (from the past 2160 hour(s)).  Assessment/Plan:  Swelling of limb Recommend:  I have had a long discussion with the patient regarding swelling and why it  causes symptoms.  Patient will begin wearing graduated compression on a daily basis a prescription was given. The patient will  wear the stockings first thing in the morning and removing them in the evening. The patient is instructed specifically not to sleep in the stockings.   In addition, behavioral modification will be initiated.  This will include frequent elevation, use of over the counter pain medications and exercise such as walking.  Consideration for a lymph pump will also be made based upon the effectiveness of conservative therapy.  This  would help to improve the edema control and prevent sequela such as ulcers and infections   Patient should undergo duplex ultrasound of the venous system to ensure that DVT or reflux is not present.  The patient will follow-up with me after the ultrasound.    Essential hypertension, benign blood pressure control important in reducing the progression of atherosclerotic disease. On appropriate oral medications.      Festus Barren 12/05/2022, 2:56 PM   This note was created with Dragon medical transcription system.  Any errors from dictation are unintentional.

## 2022-12-20 ENCOUNTER — Encounter (INDEPENDENT_AMBULATORY_CARE_PROVIDER_SITE_OTHER): Payer: Self-pay | Admitting: Nurse Practitioner

## 2022-12-20 ENCOUNTER — Ambulatory Visit (INDEPENDENT_AMBULATORY_CARE_PROVIDER_SITE_OTHER): Payer: Medicare PPO | Admitting: Nurse Practitioner

## 2022-12-20 ENCOUNTER — Ambulatory Visit (INDEPENDENT_AMBULATORY_CARE_PROVIDER_SITE_OTHER): Payer: Medicare PPO

## 2022-12-20 VITALS — BP 165/80 | HR 60 | Resp 16 | Wt 231.4 lb

## 2022-12-20 DIAGNOSIS — M7989 Other specified soft tissue disorders: Secondary | ICD-10-CM

## 2022-12-20 DIAGNOSIS — I10 Essential (primary) hypertension: Secondary | ICD-10-CM

## 2022-12-20 DIAGNOSIS — I890 Lymphedema, not elsewhere classified: Secondary | ICD-10-CM | POA: Diagnosis not present

## 2022-12-21 ENCOUNTER — Encounter (INDEPENDENT_AMBULATORY_CARE_PROVIDER_SITE_OTHER): Payer: Self-pay | Admitting: Nurse Practitioner

## 2022-12-21 NOTE — Progress Notes (Signed)
Subjective:    Patient ID: Veronica Stanley, female    DOB: June 25, 1945, 77 y.o.   MRN: [id number] Chief Complaint  Patient presents with   Follow-up    Ultrasound follow up    Veronica Stanley is a 77 y.o. female.  She returns today for evaluation for possible venous stasis in the setting of recurrent cellulitis and venous stasis ulcerations.  This is predominantly affected the left but the stasis changes and swelling are now affecting the right as well.  She had 2 previous episodes of cellulitis it sounds like.  Both happened after minor traumas.  The swelling is somewhat more significant on the left lower extremity and she has had some more notable trauma in this leg.  No previous history of DVT or superficial thrombophlebitis to her knowledge.  She has recently got compression stockings and is just begun wearing them.  He notes that these have been helpful for her swelling thus far.  She has not had any recurrence of cellulitis.  Today her swelling actually looks very good.  Today noninvasive studies show no evidence of DVT or superficial thrombophlebitis bilaterally.  No evidence of deep venous insufficiency or superficial venous reflux bilaterally.    Review of Systems  Cardiovascular:  Positive for leg swelling.  Skin:  Positive for wound.  All other systems reviewed and are negative.      Objective:   Physical Exam Vitals reviewed.  HENT:     Head: Normocephalic.  Cardiovascular:     Rate and Rhythm: Normal rate.     Pulses: Normal pulses.  Pulmonary:     Effort: Pulmonary effort is normal.  Musculoskeletal:     Right lower leg: Edema present.     Left lower leg: Edema present.  Skin:    General: Skin is warm and dry.     Comments: Mild stasis dermatitis bilaterally  Neurological:     Mental Status: She is alert and oriented to person, place, and time.  Psychiatric:        Mood and Affect: Mood normal.        Behavior: Behavior normal.        Thought Content: Thought  content normal.        Judgment: Judgment normal.     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 06/2020 Cardiac CT: Ca2+ = 157 (66th %i'le).   Basal cell carcinoma, arm 04/2012   on arm and face    Chronic heart failure with preserved ejection fraction (HFpEF) (HCC)    a. 05/2020 Echo: EF 55-60%, no rwma, GrII DD, nl RV fxn, mild MR, triv AI.   Diverticulitis    colon   GERD (gastroesophageal reflux disease)    Hypertension    patient denies not on meds    Menopausal symptoms    Osteoarthrosis involving, or with mention of more than one site, but not specified as generalized, multiple sites    Osteoporosis    PAF (paroxysmal atrial fibrillation) (HCC)    a. CHA2DS2VASc = 5-6 -->eliquis.   Peripheral vascular disease (HCC)    PSVT (paroxysmal supraventricular tachycardia)    a. 02/2022 Zio: Predominantly sinus rhythm-60 bpm (41-176).  95 SVT  runs (fastest 176x6 beats; longest 20.9 seconds at 122 bpm).  No triggered events.    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 [employer],    Comment: Retired completely now   Occupation:    Tobacco Use   Smoking status: Never   Smokeless tobacco: Never  Vaping Use   Vaping Use: Never used  Substance and Sexual Activity   Alcohol use: Yes    Alcohol/week: 3.0 standard drinks of alcohol    Types: 3 Glasses of wine per week   Drug use: Never   Sexual activity: Not on file  Other Topics Concern   Not on file  Social History Narrative   No living will   Would want daughter, [name],  Then son Helyn Numbers to  make health care decisions for her   Would accept resuscitation attempts   Not sure about tube feedings   Social Determinants of Health   Financial Resource Strain: Not on file  Food Insecurity: Not on file  Transportation Needs: Not on file  Physical Activity: Not on file  Stress: Not on file  Social Connections: Not on file  Intimate Partner Violence: Not on file    Past Surgical History:  Procedure Laterality Date   ABDOMINAL HYSTERECTOMY     BREAST BIOPSY Left 2003   Left breast calcifications benign   COLONOSCOPY WITH PROPOFOL N/A 04/22/2018   Procedure: COLONOSCOPY WITH PROPOFOL;  Surgeon: Scot Jun, MD;  Location: [facility] ENDOSCOPY;  Service: Endoscopy;  Laterality: N/A;   DILATION AND CURETTAGE OF UTERUS     rupture- peritonitis & hysterectomy 12/79   IR KYPHO LUMBAR INC FX REDUCE BONE BX UNI/BIL CANNULATION INC/IMAGING  01/03/2022   IR RADIOLOGIST EVAL & MGMT  01/26/2022   TOTAL HIP ARTHROPLASTY Left 10/19/2017   Procedure: LEFT TOTAL HIP ARTHROPLASTY ANTERIOR APPROACH;  Surgeon: Kathryne Hitch, MD;  Location: [facility] ORS;  Service: Orthopedics;  Laterality: Left;   VAGINAL DELIVERY     x2    Family History  Problem Relation Age of Onset   Osteoporosis Mother    Coronary artery disease Father    Breast cancer Sister 96       twice   Cancer Maternal Grandfather        colon cancer   Breast cancer Cousin    Diabetes Neg Hx    Hypertension Neg Hx     Allergies  Allergen Reactions   Penicillins Anaphylaxis, Shortness Of Breath, Swelling and Rash    Has patient had a PCN reaction causing immediate rash, facial/tongue/throat swelling, SOB or lightheadedness with hypotension: Yes Has patient had a PCN reaction causing severe rash involving mucus membranes or skin necrosis: Yes Has patient had a PCN reaction that required hospitalization: No Has patient had a PCN reaction occurring within the last 10 years: No If all of the above answers are "NO", then may  proceed with Cephalosporin use.    Calcium Channel Blockers     Leg swelling   Ibandronate Sodium Other (See Comments)    aches   Eliquis [Apixaban] Rash    Hair loss  Blisters on face        Latest Ref Rng & Units 04/12/2020   11:41 AM 02/10/2020  9:32 AM 12/11/2018    9:36 AM  CBC  WBC 4.0 - 10.5 K/uL 6.5  6.0  5.8   Hemoglobin 12.0 - 15.0 g/dL 16.1  09.6  04.5   Hematocrit 36.0 - 46.0 % 45.6  39.8  38.5   Platelets 150 - 400 K/uL 279  253.0  252.0       CMP     Component Value Date/Time   NA 141 07/19/2022 1313   K 4.3 07/19/2022 1313   CL 105 07/19/2022 1313   CO2 32 07/19/2022 1313   GLUCOSE 85 07/19/2022 1313   BUN 17 07/19/2022 1313   CREATININE 0.74 07/19/2022 1313   CALCIUM 9.1 07/19/2022 1313   PROT 7.1 02/10/2020 0932   ALBUMIN 4.1 02/10/2020 0932   AST 15 02/10/2020 0932   ALT 9 02/10/2020 0932   ALKPHOS 69 02/10/2020 0932   BILITOT 0.5 02/10/2020 0932   GFR 75.10 02/10/2020 0932   GFRNONAA >60 07/19/2022 1313     No results found.     Assessment & Plan:   1. Lymphedema Currently the patient is doing fairly well with conservative therapy including use of medical grade compression and elevation.  She is advised to incorporate exercise of 15 to 20 minutes 3 to 4 days/week of activity.  Following this we will plan on having the patient return in 6's for reevaluation however if she has issues she is advised to follow-up sooner.  2. Essential hypertension, benign Continue antihypertensive medications as already ordered, these medications have been reviewed and there are no changes at this time.   Current Outpatient Medications on File Prior to Visit  Medication Sig Dispense Refill   B Complex Vitamins (VITAMIN B-COMPLEX) TABS Take 1 tablet by mouth daily.     Cholecalciferol (VITAMIN D3) 1000 units CAPS Take 1,000 Units by mouth daily.     Coenzyme Q10 (COQ10) 100 MG CAPS Take 100 mg by mouth daily.     COLLAGEN PO Take 1 tablet by mouth daily.      Cyanocobalamin 5000 MCG CAPS Place 1 capsule under the tongue daily.     diltiazem (CARDIZEM) 60 MG tablet Take 1 tablet (60 mg total) by mouth daily as needed. Take for elevated heart rate greater than 100 60 tablet 1   doxycycline (VIBRAMYCIN) 100 MG capsule Take 1 capsule (100 mg total) by mouth 2 (two) times daily. 14 capsule 0   furosemide (LASIX) 20 MG tablet Take 20 mg by mouth daily as needed.     Glucosamine HCl (GLUCOSAMINE PO) Take 2 capsules by mouth in the morning.     ibuprofen (ADVIL,MOTRIN) 200 MG tablet Take 400 mg by mouth daily as needed for headache or mild pain.     MAGNESIUM GLYCINATE PO Take 400 Units by mouth at bedtime.     metoprolol tartrate (LOPRESSOR) 25 MG tablet Take 25 mg by mouth 2 (two) times daily.     Multiple Vitamin (MULTIVITAMIN) tablet Take 1 tablet by mouth daily.     Multiple Vitamins-Minerals (PRESERVISION AREDS PO) Take by mouth daily.     pantoprazole (PROTONIX) 40 MG tablet Take 40 mg by mouth daily.     potassium chloride (KLOR-CON M) 10 MEQ tablet Take 1 tablet (10 meq) twice a day on Mondays/ Wednesday/ Fridays/ Saturdays & take 1 tablet (10 meq) once daily on Tuesdays/ Thursdays/ Sundays 180 tablet 1   triamcinolone cream (KENALOG) 0.1 % Apply to affected skin once every 2 days. 454 g 0  Zinc 50 MG TABS Take 50 mg by mouth as needed.     gabapentin (NEURONTIN) 100 MG capsule Take 100 mg by mouth at bedtime. (Patient not taking: Reported on 10/02/2022)     methocarbamol (ROBAXIN) 500 MG tablet Take 500 mg by mouth 2 (two) times daily as needed for muscle spasms. (Patient not taking: Reported on 10/02/2022)     traMADol (ULTRAM) 50 MG tablet Take 1 tablet by mouth 3 (three) times daily. (Patient not taking: Reported on 03/30/2022)     No current facility-administered medications on file prior to visit.    There are no Patient Instructions on file for this visit. No follow-ups on file.   Georgiana Spinner, NP

## 2023-04-08 NOTE — Progress Notes (Unsigned)
Cardiology Office Note  Date:  04/09/2023   ID:  Veronica Stanley, DOB 1945/09/11, MRN [id number]  PCP:  Pcp, No   Chief Complaint  Patient presents with   6 month follow up     "Doing well." Medications reviewed by the patient verbally.     HPI:  [name] is a 77 year-old woman with past medical history of Obstructive sleep apnea, on CPAP Paroxysmal atrial fibrillation rate 130 bpm 01/31/22 Hypertension Obesity Nonsmoker, no diabetes Leg swelling on calcium channel blockers EF 55 to 60%, mild to moderate dilated left atrium Who presents for paroxysmal atrial fibrillation, leg edema  Last seen by myself in clinic April 2024 Reports feeling well overall  taking Lasix as needed metoprolol tartrate 25 twice daily Calcium channel blocker previously held for leg swelling  Uses apple watch, 2-3 % burden from her interpretation of watch data Took herself off Xarelto,  intolerance of Eliquis in the past  Weight stable 234  followed by Dr. Mayford Knife, PMD  Echocardiogram October 2023 EF 55 to 60%  Lymphedema, trace leg swelling Reports that she has compression hose but is not wearing them today  Lab work reviewed Total cholesterol 148 LDL 71  Denies significant chest pain or shortness of breath  EKG personally reviewed by myself on todays visit EKG Interpretation Date/Time:  Monday April 09 2023 14:01:27 EDT Ventricular Rate:  69 PR Interval:  160 QRS Duration:  90 QT Interval:  416 QTC Calculation: 445 R Axis:   -15  Text Interpretation: Normal sinus rhythm Minimal voltage criteria for LVH, may be normal variant ( R in aVL ) When compared with ECG of 12-Apr-2020 12:30, No significant change was found Confirmed by Julien Nordmann [phone]) on 04/09/2023 2:07:00 PM   Other past medical history reviewed August 2023, symptoms concerning for recurrent atrial fibrillation, atrial fibrillation documented on EKG with Dr. Hyacinth Meeker, had no symptoms Rate on EKG report 132  bpm Placed on amiodarone and diltiazem , by primary care, with metoprolol which she was already taking She developed significant lower extremity edema, concerned that diltiazem contributed to lower extremity swelling Seen in urgent care for swelling  Zio monitor showing brief runs of SVT otherwise normal sinus rhythm, no significant A-fib  Eliquis : hair loss/rash Changed by PMD to xarelto  Creatinine clearance over 100  CT coronary, Coronary calcium score of 157. This was 66th percentile  Compliant with CPAP for sleep apnea   PMH:   has a past medical history of Agatston coronary artery calcium score between 100 and 199, Basal cell carcinoma, arm (04/2012), Chronic heart failure with preserved ejection fraction (HFpEF) (HCC), Diverticulitis, GERD (gastroesophageal reflux disease), Hypertension, Menopausal symptoms, Osteoarthrosis involving, or with mention of more than one site, but not specified as generalized, multiple sites, Osteoporosis, PAF (paroxysmal atrial fibrillation) (HCC), Peripheral vascular disease (HCC), and PSVT (paroxysmal supraventricular tachycardia) (HCC).  PSH:    Past Surgical History:  Procedure Laterality Date   ABDOMINAL HYSTERECTOMY     BREAST BIOPSY Left 2003   Left breast calcifications benign   COLONOSCOPY WITH PROPOFOL N/A 04/22/2018   Procedure: COLONOSCOPY WITH PROPOFOL;  Surgeon: Scot Jun, MD;  Location[facility] ENDOSCOPY;  Service: Endoscopy;  Laterality: N/A;   DILATION AND CURETTAGE OF UTERUS     rupture- peritonitis & hysterectomy 12/79   IR KYPHO LUMBAR INC FX REDUCE BONE BX UNI/BIL CANNULATION INC/IMAGING  01/03/2022   IR RADIOLOGIST EVAL & MGMT  01/26/2022   TOTAL HIP ARTHROPLASTY Left 10/19/2017  Procedure: LEFT TOTAL HIP ARTHROPLASTY ANTERIOR APPROACH;  Surgeon: Kathryne Hitch, MD;  Location: [facility] ORS;  Service: Orthopedics;  Laterality: Left;   VAGINAL DELIVERY     x2    Current Outpatient Medications  Medication Sig Dispense  Refill   B Complex Vitamins (VITAMIN B-COMPLEX) TABS Take 1 tablet by mouth daily.     Cholecalciferol (VITAMIN D3) 1000 units CAPS Take 1,000 Units by mouth daily.     Coenzyme Q10 (COQ10) 100 MG CAPS Take 100 mg by mouth daily.     COLLAGEN PO Take 1 tablet by mouth daily.     Cyanocobalamin 5000 MCG CAPS Place 1 capsule under the tongue daily.     furosemide (LASIX) 20 MG tablet Take 20 mg by mouth daily as needed.     Glucosamine HCl (GLUCOSAMINE PO) Take 2 capsules by mouth in the morning.     ibuprofen (ADVIL,MOTRIN) 200 MG tablet Take 400 mg by mouth daily as needed for headache or mild pain.     MAGNESIUM GLYCINATE PO Take 400 Units by mouth at bedtime.     methocarbamol (ROBAXIN) 500 MG tablet Take 500 mg by mouth 2 (two) times daily as needed for muscle spasms.     Multiple Vitamin (MULTIVITAMIN) tablet Take 1 tablet by mouth daily.     Multiple Vitamins-Minerals (PRESERVISION AREDS PO) Take by mouth daily.     pantoprazole (PROTONIX) 40 MG tablet Take 40 mg by mouth daily.     potassium chloride (KLOR-CON M) 10 MEQ tablet Take 1 tablet (10 meq) twice a day on Mondays/ Wednesday/ Fridays/ Saturdays & take 1 tablet (10 meq) once daily on Tuesdays/ Thursdays/ Sundays 180 tablet 1   traMADol (ULTRAM) 50 MG tablet Take 1 tablet by mouth 3 (three) times daily.     triamcinolone cream (KENALOG) 0.1 % Apply to affected skin once every 2 days. 454 g 0   valsartan (DIOVAN) 80 MG tablet Take 1 tablet (80 mg total) by mouth daily. 90 tablet 3   Zinc 50 MG TABS Take 50 mg by mouth as needed.     diltiazem (CARDIZEM) 60 MG tablet Take 1 tablet (60 mg total) by mouth daily as needed. Take for elevated heart rate greater than 100 30 tablet 1   doxycycline (VIBRAMYCIN) 100 MG capsule Take 1 capsule (100 mg total) by mouth 2 (two) times daily. (Patient not taking: Reported on 04/09/2023) 14 capsule 0   gabapentin (NEURONTIN) 100 MG capsule Take 100 mg by mouth at bedtime. (Patient not taking: Reported  on 10/02/2022)     metoprolol tartrate (LOPRESSOR) 25 MG tablet Take 1 tablet (25 mg total) by mouth 2 (two) times daily. 180 tablet 3   No current facility-administered medications for this visit.    Allergies:   Penicillins, Calcium channel blockers, Ibandronate sodium, and Eliquis [apixaban]   Social History:  The patient  reports that she has never smoked. She has never used smokeless tobacco. She reports current alcohol use of about 3.0 standard drinks of alcohol per week. She reports that she does not use drugs.   Family History:   family history includes Breast cancer in her cousin; Breast cancer (age of onset: 37) in her sister; Cancer in her maternal grandfather; Coronary artery disease in her father; Osteoporosis in her mother.    Review of Systems: Review of Systems  Constitutional: Negative.   HENT: Negative.    Respiratory: Negative.    Cardiovascular: Negative.   Gastrointestinal: Negative.   Musculoskeletal:  Negative.   Neurological: Negative.   Psychiatric/Behavioral: Negative.    All other systems reviewed and are negative.   PHYSICAL EXAM: VS:  BP (!) 170/70 (BP Location: Left Arm, Patient Position: Sitting, Cuff Size: Normal)   Pulse 69   Ht 5' 6.5" (1.689 m)   Wt 234 lb 2 oz (106.2 kg)   LMP  (LMP Unknown)   SpO2 95%   BMI 37.22 kg/m  , BMI Body mass index is 37.22 kg/m. Constitutional:  oriented to person, place, and time. No distress.  HENT:  Head: Grossly normal Eyes:  no discharge. No scleral icterus.  Neck: No JVD, no carotid bruits  Cardiovascular: Regular rate and rhythm, no murmurs appreciated Pulmonary/Chest: Clear to auscultation bilaterally, no wheezes or rails Abdominal: Soft.  no distension.  no tenderness.  Musculoskeletal: Normal range of motion Neurological:  normal muscle tone. Coordination normal. No atrophy Skin: Skin warm and dry Psychiatric: normal affect, pleasant  Recent Labs: 07/19/2022: BUN 17; Creatinine, Ser 0.74;  Potassium 4.3; Sodium 141    Lipid Panel Lab Results  Component Value Date   CHOL 179 03/13/2018   HDL 49.30 03/13/2018   LDLCALC 102 (H) 03/13/2018   TRIG 138.0 03/13/2018    Wt Readings from Last 3 Encounters:  04/09/23 234 lb 2 oz (106.2 kg)  12/20/22 231 lb 6.4 oz (105 kg)  12/05/22 232 lb 12.8 oz (105.6 kg)     ASSESSMENT AND PLAN:  Problem List Items Addressed This Visit       Cardiology Problems   Essential hypertension, benign   Relevant Medications   diltiazem (CARDIZEM) 60 MG tablet   metoprolol tartrate (LOPRESSOR) 25 MG tablet   valsartan (DIOVAN) 80 MG tablet   Other Relevant Orders   EKG 12-Lead (Completed)   Elevated coronary artery calcium score   Relevant Medications   diltiazem (CARDIZEM) 60 MG tablet   metoprolol tartrate (LOPRESSOR) 25 MG tablet   valsartan (DIOVAN) 80 MG tablet   Other Visit Diagnoses     PAF (paroxysmal atrial fibrillation) (HCC)    -  Primary   Relevant Medications   diltiazem (CARDIZEM) 60 MG tablet   metoprolol tartrate (LOPRESSOR) 25 MG tablet   valsartan (DIOVAN) 80 MG tablet   Other Relevant Orders   EKG 12-Lead (Completed)   Chronic heart failure with preserved ejection fraction (HFpEF) (HCC)       Relevant Medications   diltiazem (CARDIZEM) 60 MG tablet   metoprolol tartrate (LOPRESSOR) 25 MG tablet   valsartan (DIOVAN) 80 MG tablet   Other Relevant Orders   EKG 12-Lead (Completed)   OSA (obstructive sleep apnea)       Leg edema       Bradycardia       Relevant Orders   EKG 12-Lead (Completed)   OSA on CPAP       Diastolic dysfunction       Relevant Orders   EKG 12-Lead (Completed)      Atrial fib, paroxysmal Episode of atrial fibrillation August 2023 with primary care rate 123 bpm on EKG by report Continues metoprolol tartrate 25 twice daily Declined anticoagulation, No longer taking Xarelto or Eliquis.  Had side effects on Eliquis, previously discussed risk of stroke She is using her Apple Watch with  arrhythmia detection  Leg swelling Will avoid calcium channel blockers She reports that she has lymphedema Taking Lasix as needed for leg swelling Weight stable 234 pounds  Family hx of CAD, MI Non-smoker, no diabetes CT coronary calcium  score 157 On red yeast rice, fish oils, prefers not to be on a statin or shin medication  Morbid obesity We have encouraged continued exercise, careful diet management in an effort to lose weight.  Hypertension Initial blood pressure elevated, improved on recheck 160/80 Recommend she start valsartan 80 mg daily, close monitoring of blood pressure at home  Osteoarthritis Acceptable risk for knee surgery No further cardiac testing needed Recommend she call us with blood pressures prior to any surgical intervention   Signed, Dossie Arbour, M.D., Ph.D. [facility] [place], [place] [phone]

## 2023-04-09 ENCOUNTER — Encounter: Payer: Self-pay | Admitting: Cardiovascular Disease

## 2023-04-09 ENCOUNTER — Ambulatory Visit: Payer: Medicare PPO | Attending: Cardiovascular Disease | Admitting: Cardiovascular Disease

## 2023-04-09 VITALS — BP 160/80 | HR 69 | Ht 66.5 in | Wt 234.1 lb

## 2023-04-09 DIAGNOSIS — R600 Localized edema: Secondary | ICD-10-CM

## 2023-04-09 DIAGNOSIS — I480 Paroxysmal atrial fibrillation: Secondary | ICD-10-CM

## 2023-04-09 DIAGNOSIS — G4733 Obstructive sleep apnea (adult) (pediatric): Secondary | ICD-10-CM

## 2023-04-09 DIAGNOSIS — I5189 Other ill-defined heart diseases: Secondary | ICD-10-CM

## 2023-04-09 DIAGNOSIS — I10 Essential (primary) hypertension: Secondary | ICD-10-CM

## 2023-04-09 DIAGNOSIS — I5032 Chronic diastolic (congestive) heart failure: Secondary | ICD-10-CM

## 2023-04-09 DIAGNOSIS — R931 Abnormal findings on diagnostic imaging of heart and coronary circulation: Secondary | ICD-10-CM

## 2023-04-09 DIAGNOSIS — R001 Bradycardia, unspecified: Secondary | ICD-10-CM

## 2023-04-09 MED ORDER — VALSARTAN 80 MG PO TABS: 80.0000 mg | ORAL_TABLET | Freq: Every day | ORAL | 3 refills | Status: AC

## 2023-04-09 MED ORDER — DILTIAZEM HCL 60 MG PO TABS: 60.0000 mg | ORAL_TABLET | Freq: Every day | ORAL | 1 refills | Status: AC | PRN

## 2023-04-09 MED ORDER — METOPROLOL TARTRATE 25 MG PO TABS
25.0000 mg | ORAL_TABLET | Freq: Two times a day (BID) | ORAL | 3 refills | Status: AC
Start: 2023-04-09 — End: ?

## 2023-04-09 NOTE — Patient Instructions (Addendum)
Primary Care: Dr. Nemiah Commander at [facility]  [phone]   Medication Instructions:  Please valsartan 80 mg daily Monitor blood pressure  For tachycardia/afib, Take diltiazem as needed  If you need a refill on your cardiac medications before your next appointment, please call your pharmacy.   Lab work: No new labs needed  Testing/Procedures: No new testing needed  Follow-Up: At [facility], you and your health needs are our priority.  As part of our continuing mission to provide you with exceptional heart care, we have created designated Provider Care Teams.  These Care Teams include your primary Cardiologist (physician) and Advanced Practice Providers (APPs -  Physician Assistants and Nurse Practitioners) who all work together to provide you with the care you need, when you need it.  You will need a follow up appointment in 12 months  Providers on your designated Care Team:   Nicolasa Ducking, NP Eula Listen, PA-C Cadence Fransico Michael, [place]  COVID-19 Vaccine Information can be found at: [email or web address] For questions related to vaccine distribution or appointments, please email vaccine@ [email or web address] or call [phone].

## 2023-05-31 ENCOUNTER — Ambulatory Visit: Payer: Medicare PPO | Admitting: Orthopaedic Surgery

## 2023-05-31 ENCOUNTER — Other Ambulatory Visit (INDEPENDENT_AMBULATORY_CARE_PROVIDER_SITE_OTHER): Payer: Medicare PPO

## 2023-05-31 ENCOUNTER — Encounter: Payer: Self-pay | Admitting: Orthopaedic Surgery

## 2023-05-31 VITALS — Ht 66.5 in | Wt 234.0 lb

## 2023-05-31 DIAGNOSIS — M1711 Unilateral primary osteoarthritis, right knee: Secondary | ICD-10-CM | POA: Diagnosis not present

## 2023-05-31 DIAGNOSIS — M25561 Pain in right knee: Secondary | ICD-10-CM

## 2023-05-31 DIAGNOSIS — G8929 Other chronic pain: Secondary | ICD-10-CM

## 2023-05-31 NOTE — Progress Notes (Signed)
The patient is a 77 year old female that we have seen for many years now as a relates to arthritis of her right knee.  She has had numerous injections over the past and those have done well until over the last year she developed worsening right knee pain that is on a daily basis and 10 out of 10.  Her right knee pain is detrimentally affecting her mobility, her quality of life and her actives daily living to the point she is ready to proceed with knee replacement surgery.  She does have a history of A-fib but is only on aspirin for this.  She sees her cardiologist on a regular basis Dr. Mariah Milling.  At this point she said she is ready proceed with her knee replacement.  She has had no acute changes in her medical status and she denies any headache, chest pain, shortness of breath, fever, chills, for a right placement.  Nausea, vomiting.  She is not a smoker and her BMI is 37.20.  I was able to review all of her past medical history and medications within epic.  Examination of her right knee shows varus malalignment that is not really correctable.  She has a slight flexion contracture and severe patellofemoral crepitation.  She has severe medial joint line tenderness as well throughout the arc of motion of her right knee.  X-rays of the right knee has significantly worsened over the last 3 years.  She has bone-on-bone wear with complete loss of medial joint space in the patellofemoral joint.  There is varus malalignment that is more significant and osteophytes in all 3 compartments.  At this point we did discuss knee replacement surgery.  I talked about the risks and benefits of the surgery and what to expect from an intraoperative and postoperative standpoint.  I showed her her x-rays and knee replacement model.  Her daughter is with her as well and we discussed everything in detail.  All questions and concerns were answered and addressed.  She will reach out to her cardiologist to let him know that we are  scheduling her for surgery symptoms near future

## 2023-06-07 ENCOUNTER — Encounter: Payer: Self-pay | Admitting: Cardiovascular Disease

## 2023-06-12 ENCOUNTER — Telehealth: Payer: Self-pay

## 2023-06-12 ENCOUNTER — Telehealth: Payer: Self-pay | Admitting: Cardiovascular Disease

## 2023-06-12 NOTE — Telephone Encounter (Signed)
   Name: Veronica Stanley  DOB: 07/02/1945  MRN: [id number]  Primary Cardiologist: Julien Nordmann, MD   Preoperative team, please contact this patient and set up a phone call appointment for further preoperative risk assessment. Please obtain consent and complete medication review. Thank you for your help.  I confirm that guidance regarding antiplatelet and oral anticoagulation therapy has been completed and, if necessary, noted below.  None requested   I also confirmed the patient resides in the state of [place]. As per [facility] Medical Board telemedicine laws, the patient must reside in the state in which the provider is licensed.   Ronney Asters, NP 06/12/2023, 9:18 AM [facility]

## 2023-06-12 NOTE — Telephone Encounter (Signed)
Spoke with patient who is agreeable to do a tele visit on 1/7 at 2 pm. Med rec and consent done.

## 2023-06-12 NOTE — Telephone Encounter (Signed)
   Pre-operative Risk Assessment    Patient Name: Veronica Stanley  DOB: 03-05-1946 MRN: [id number]      Request for Surgical Clearance    Procedure:   R Total knee arthroplasty  Date of Surgery:  Clearance TBD                                 Surgeon:  Dr Doneen Poisson Surgeon's Group or Practice Name:  Cyndia Skeeters at [facility] Phone number:  [phone] Fax number:  [phone]   Type of Clearance Requested:   - Medical    Type of Anesthesia:  Spinaland Block   Additional requests/questions:    Courtney Heys   06/12/2023, 9:09 AM

## 2023-06-12 NOTE — Telephone Encounter (Signed)
  Patient Consent for Virtual Visit        Veronica Stanley has provided verbal consent on 06/12/2023 for a virtual visit (video or telephone).   CONSENT FOR VIRTUAL VISIT FOR:  Veronica Stanley  By participating in this virtual visit I agree to the following:  I hereby voluntarily request, consent and authorize [facility] and its employed or contracted physicians, physician assistants, nurse practitioners or other licensed health care professionals (the Practitioner), to provide me with telemedicine health care services (the "Services") as deemed necessary by the treating Practitioner. I acknowledge and consent to receive the Services by the Practitioner via telemedicine. I understand that the telemedicine visit will involve communicating with the Practitioner through live audiovisual communication technology and the disclosure of certain medical information by electronic transmission. I acknowledge that I have been given the opportunity to request an in-person assessment or other available alternative prior to the telemedicine visit and am voluntarily participating in the telemedicine visit.  I understand that I have the right to withhold or withdraw my consent to the use of telemedicine in the course of my care at any time, without affecting my right to future care or treatment, and that the Practitioner or I may terminate the telemedicine visit at any time. I understand that I have the right to inspect all information obtained and/or recorded in the course of the telemedicine visit and may receive copies of available information for a reasonable fee.  I understand that some of the potential risks of receiving the Services via telemedicine include:  Delay or interruption in medical evaluation due to technological equipment failure or disruption; Information transmitted may not be sufficient (e.g. poor resolution of images) to allow for appropriate medical decision making by the Practitioner;  and/or  In rare instances, security protocols could fail, causing a breach of personal health information.  Furthermore, I acknowledge that it is my responsibility to provide information about my medical history, conditions and care that is complete and accurate to the best of my ability. I acknowledge that Practitioner's advice, recommendations, and/or decision may be based on factors not within their control, such as incomplete or inaccurate data provided by me or distortions of diagnostic images or specimens that may result from electronic transmissions. I understand that the practice of medicine is not an exact science and that Practitioner makes no warranties or guarantees regarding treatment outcomes. I acknowledge that a copy of this consent can be made available to me via my patient portal [facility] MyChart), or I can request a printed copy by calling the office of [facility].    I understand that my insurance will be billed for this visit.   I have read or had this consent read to me. I understand the contents of this consent, which adequately explains the benefits and risks of the Services being provided via telemedicine.  I have been provided ample opportunity to ask questions regarding this consent and the Services and have had my questions answered to my satisfaction. I give my informed consent for the services to be provided through the use of telemedicine in my medical care

## 2023-06-18 NOTE — Telephone Encounter (Signed)
Pt called in asking if she can change the time of her tele visit on 06/26/23

## 2023-06-21 ENCOUNTER — Telehealth: Payer: Self-pay | Admitting: Orthopaedic Surgery

## 2023-06-21 NOTE — Telephone Encounter (Signed)
Left voicemail advising patient CD is ready for pickup at front desk.

## 2023-06-21 NOTE — Telephone Encounter (Signed)
 Received call from patient. She is requesting xrays from 05/31/23 to CD. Please call when ready. Please place with auth with CD, advised patient to sign when pick up...[phone]

## 2023-06-22 ENCOUNTER — Ambulatory Visit (INDEPENDENT_AMBULATORY_CARE_PROVIDER_SITE_OTHER): Payer: Medicare PPO | Admitting: Nurse Practitioner

## 2023-06-26 ENCOUNTER — Telehealth: Payer: Medicare PPO

## 2023-06-29 NOTE — Progress Notes (Signed)
   Virtual Visit via Telephone Note   Evaluation Performed:  Preoperative cardiovascular risk assessment _____________   Date:  07/02/2023   Patient ID:  Veronica Stanley, DOB [birth year], MRN [id number] Patient Location:  Home Provider location:   Office  Primary Care Provider:  Pcp, No Primary Cardiologist:  Timothy Gollan, MD  Chief Complaint / Patient Profile  Spoke with patient, and she is not sure she is going to have the surgery with Dr. Vernetta.  She is seeing a scientist, water quality for a second opinion. I explained that she will have to have the second surgeon request a cardiac clearance if they need one. IF she chooses to have Dr. Vernetta who previously requested the clearance she will need to call back so we can reschedule her pre-op cardiac clearance appointment.  She verbalized understanding.   Lamarr Satterfield, NP  07/02/2023, 6:55 AM

## 2023-07-02 ENCOUNTER — Ambulatory Visit: Payer: Medicare PPO

## 2023-07-02 ENCOUNTER — Telehealth: Payer: Medicare PPO

## 2023-07-03 ENCOUNTER — Telehealth: Payer: Self-pay | Admitting: Cardiovascular Disease

## 2023-07-03 NOTE — Telephone Encounter (Signed)
   Pre-operative Risk Assessment    Patient Name: Veronica Stanley  DOB: Nov 18, 1945 MRN: [id number]   Date of last office visit: 04/09/23 Date of next office visit: not indicated   Request for Surgical Clearance    Procedure:   Rt TRA CORI  Date of Surgery:  Clearance 09/06/23                                Surgeon:  Dr. Lynwood Sick Surgeon's Group or Practice Name:  [facility] Phone number:  [phone] Fax number:  not indicated   Type of Clearance Requested:   - Pharmacy:  Hold Aspirin  81 mg  prior to   Type of Anesthesia:   local/spinal   Additional requests/questions:    Signed, Gwendolyn H Slade   07/03/2023, 12:49 PM

## 2023-07-04 ENCOUNTER — Telehealth: Payer: Self-pay

## 2023-07-04 NOTE — Telephone Encounter (Signed)
  Patient Consent for Virtual Visit        Veronica Stanley has provided verbal consent on 07/04/2023 for a virtual visit (video or telephone).   CONSENT FOR VIRTUAL VISIT FOR:  Veronica Stanley  By participating in this virtual visit I agree to the following:  I hereby voluntarily request, consent and authorize [facility] and its employed or contracted physicians, physician assistants, nurse practitioners or other licensed health care professionals (the Practitioner), to provide me with telemedicine health care services (the "Services") as deemed necessary by the treating Practitioner. I acknowledge and consent to receive the Services by the Practitioner via telemedicine. I understand that the telemedicine visit will involve communicating with the Practitioner through live audiovisual communication technology and the disclosure of certain medical information by electronic transmission. I acknowledge that I have been given the opportunity to request an in-person assessment or other available alternative prior to the telemedicine visit and am voluntarily participating in the telemedicine visit.  I understand that I have the right to withhold or withdraw my consent to the use of telemedicine in the course of my care at any time, without affecting my right to future care or treatment, and that the Practitioner or I may terminate the telemedicine visit at any time. I understand that I have the right to inspect all information obtained and/or recorded in the course of the telemedicine visit and may receive copies of available information for a reasonable fee.  I understand that some of the potential risks of receiving the Services via telemedicine include:  Delay or interruption in medical evaluation due to technological equipment failure or disruption; Information transmitted may not be sufficient (e.g. poor resolution of images) to allow for appropriate medical decision making by the Practitioner;  and/or  In rare instances, security protocols could fail, causing a breach of personal health information.  Furthermore, I acknowledge that it is my responsibility to provide information about my medical history, conditions and care that is complete and accurate to the best of my ability. I acknowledge that Practitioner's advice, recommendations, and/or decision may be based on factors not within their control, such as incomplete or inaccurate data provided by me or distortions of diagnostic images or specimens that may result from electronic transmissions. I understand that the practice of medicine is not an exact science and that Practitioner makes no warranties or guarantees regarding treatment outcomes. I acknowledge that a copy of this consent can be made available to me via my patient portal [facility] MyChart), or I can request a printed copy by calling the office of [facility].    I understand that my insurance will be billed for this visit.   I have read or had this consent read to me. I understand the contents of this consent, which adequately explains the benefits and risks of the Services being provided via telemedicine.  I have been provided ample opportunity to ask questions regarding this consent and the Services and have had my questions answered to my satisfaction. I give my informed consent for the services to be provided through the use of telemedicine in my medical care

## 2023-07-04 NOTE — Telephone Encounter (Signed)
   Name: Veronica Stanley  DOB: 04/20/1946  MRN: [id number]  Primary Cardiologist: Belva Boyden, MD   Preoperative team, please contact this patient and set up a phone call appointment for further preoperative risk assessment. Please obtain consent and complete medication review. Thank you for your help.  I confirm that guidance regarding antiplatelet and oral anticoagulation therapy has been completed and, if necessary, noted below.  I also confirmed the patient resides in the state of [place] . As per [facility] Medical Board telemedicine laws, the patient must reside in the state in which the provider is licensed.   Friddie Jetty, NP 07/04/2023, 7:57 AM [facility]

## 2023-07-04 NOTE — Telephone Encounter (Signed)
 Preop televisit scheduled, med rec and consent done

## 2023-08-27 ENCOUNTER — Encounter: Payer: Medicare PPO | Admitting: Obstetrics

## 2023-08-30 ENCOUNTER — Ambulatory Visit: Payer: Medicare PPO | Attending: Cardiology

## 2023-08-30 DIAGNOSIS — Z01810 Encounter for preprocedural cardiovascular examination: Secondary | ICD-10-CM | POA: Diagnosis not present

## 2023-08-30 NOTE — Progress Notes (Signed)
 Clearance notes have been faxed to surgeon fax# [phone]

## 2023-08-30 NOTE — Progress Notes (Signed)
 Virtual Visit via Telephone Note   Because of Veronica Stanley co-morbid illnesses, she is at least at moderate risk for complications without adequate follow up.  This format is felt to be most appropriate for this patient at this time.  Due to technical limitations with video connection (technology), today's appointment will be conducted as an audio only telehealth visit, and Veronica Stanley verbally agreed to proceed in this manner.   All issues noted in this document were discussed and addressed.  No physical exam could be performed with this format.  Evaluation Performed:  Preoperative cardiovascular risk assessment _____________   Date:  08/30/2023   Patient ID:  Veronica Stanley, DOB 1946-04-02, MRN [id number] Patient Location:  Home Provider location:   Office  Primary Care Provider:  Pcp, No Primary Cardiologist:  Julien Nordmann, MD  Chief Complaint / Patient Profile   78 y.o. y/o female with a h/o HTN, GERD, paroxysmal atrial fibrillation who is pending  Rt TKA CORI  and presents today for telephonic preoperative cardiovascular risk assessment.  History of Present Illness    Veronica Stanley is a 78 y.o. female who presents via audio/video conferencing for a telehealth visit today.  Pt was last seen in cardiology clinic on 04/09/2023 by Dr. Mariah Milling.  At that time Veronica Stanley was doing well .  The patient is now pending procedure as outlined above. Since her last visit, she remained stable from a cardiac standpoint.  Today she denies chest pain, shortness of breath, lower extremity edema, fatigue, palpitations, melena, hematuria, hemoptysis, diaphoresis, weakness, presyncope, syncope, orthopnea, and PND.   Past Medical History    Past Medical History:  Diagnosis Date   Agatston coronary artery calcium score between 100 and 199    a. 06/2020 Cardiac CT: Ca2+ = 157 (66th %i'le).   Basal cell carcinoma, arm 04/2012   on arm and face    Chronic heart failure with preserved ejection  fraction (HFpEF) (HCC)    a. 05/2020 Echo: EF 55-60%, no rwma, GrII DD, nl RV fxn, mild MR, triv AI.   Diverticulitis    colon   GERD (gastroesophageal reflux disease)    Hypertension    patient denies not on meds    Menopausal symptoms    Osteoarthrosis involving, or with mention of more than one site, but not specified as generalized, multiple sites    Osteoporosis    PAF (paroxysmal atrial fibrillation) (HCC)    a. CHA2DS2VASc = 5-6 -->eliquis.   Peripheral vascular disease (HCC)    PSVT (paroxysmal supraventricular tachycardia) (HCC)    a. 02/2022 Zio: Predominantly sinus rhythm-60 bpm (41-176).  95 SVT runs (fastest 176x6 beats; longest 20.9 seconds at 122 bpm).  No triggered events.   Past Surgical History:  Procedure Laterality Date   ABDOMINAL HYSTERECTOMY     BREAST BIOPSY Left 2003   Left breast calcifications benign   COLONOSCOPY WITH PROPOFOL N/A 04/22/2018   Procedure: COLONOSCOPY WITH PROPOFOL;  Surgeon: Scot Jun, MD;  Location: [facility] ENDOSCOPY;  Service: Endoscopy;  Laterality: N/A;   DILATION AND CURETTAGE OF UTERUS     rupture- peritonitis & hysterectomy 12/79   IR KYPHO LUMBAR INC FX REDUCE BONE BX UNI/BIL CANNULATION INC/IMAGING  01/03/2022   IR RADIOLOGIST EVAL & MGMT  01/26/2022   TOTAL HIP ARTHROPLASTY Left 10/19/2017   Procedure: LEFT TOTAL HIP ARTHROPLASTY ANTERIOR APPROACH;  Surgeon: Kathryne Hitch, MD;  Location: [facility] ORS;  Service: Orthopedics;  Laterality: Left;  VAGINAL DELIVERY     x2    Allergies  Allergies  Allergen Reactions   Penicillins Anaphylaxis, Shortness Of Breath, Swelling and Rash    Has patient had a PCN reaction causing immediate rash, facial/tongue/throat swelling, SOB or lightheadedness with hypotension: Yes Has patient had a PCN reaction causing severe rash involving mucus membranes or skin necrosis: Yes Has patient had a PCN reaction that required hospitalization: No Has patient had a PCN reaction occurring within the  last 10 years: No If all of the above answers are "NO", then may proceed with Cephalosporin use.    Calcium Channel Blockers     Leg swelling   Ibandronate Sodium Other (See Comments)    aches   Eliquis [Apixaban] Rash    Hair loss  Blisters on face     Home Medications    Prior to Admission medications   Medication Sig Start Date End Date Taking? Authorizing Provider  B Complex Vitamins (VITAMIN B-COMPLEX) TABS Take 1 tablet by mouth daily. 09/21/21   [provider]  Cholecalciferol (VITAMIN D3) 1000 units CAPS Take 1,000 Units by mouth daily.    [provider]  Coenzyme Q10 (COQ10) 100 MG CAPS Take 100 mg by mouth daily.    [provider]  COLLAGEN PO Take 1 tablet by mouth daily.    [provider]  Cyanocobalamin 5000 MCG CAPS Place 1 capsule under the tongue daily.    [provider]  diltiazem (CARDIZEM) 60 MG tablet Take 1 tablet (60 mg total) by mouth daily as needed. Take for elevated heart rate greater than 100 04/09/23   Gollan, Tollie Pizza, MD  doxycycline (VIBRAMYCIN) 100 MG capsule Take 1 capsule (100 mg total) by mouth 2 (two) times daily. 02/23/22   White, Elita Boone, NP  furosemide (LASIX) 20 MG tablet Take 20 mg by mouth daily as needed.    [provider]  Glucosamine HCl (GLUCOSAMINE PO) Take 2 capsules by mouth in the morning.    [provider]  ibuprofen (ADVIL,MOTRIN) 200 MG tablet Take 400 mg by mouth daily as needed for headache or mild pain.    [provider]  MAGNESIUM GLYCINATE PO Take 400 Units by mouth at bedtime.    [provider]  methocarbamol (ROBAXIN) 500 MG tablet Take 500 mg by mouth 2 (two) times daily as needed for muscle spasms. 11/28/21   [provider]  metoprolol tartrate (LOPRESSOR) 25 MG tablet Take 1 tablet (25 mg total) by mouth 2 (two) times daily. 04/09/23   Antonieta Iba, MD  Multiple Vitamin (MULTIVITAMIN) tablet Take 1 tablet by mouth daily.     [provider]  Multiple Vitamins-Minerals (PRESERVISION AREDS PO) Take by mouth daily.    [provider]  pantoprazole (PROTONIX) 40 MG tablet Take 40 mg by mouth daily. 10/14/21   [provider]  potassium chloride (KLOR-CON M) 10 MEQ tablet Take 1 tablet (10 meq) twice a day on Mondays/ Wednesday/ Fridays/ Saturdays & take 1 tablet (10 meq) once daily on Tuesdays/ Thursdays/ Sundays 07/03/22   Antonieta Iba, MD  traMADol (ULTRAM) 50 MG tablet Take 1 tablet by mouth 3 (three) times daily. 12/08/21   [provider]  triamcinolone cream (KENALOG) 0.1 % Apply to affected skin once every 2 days. 11/15/22   Immordino, Jeannett Senior, FNP  valsartan (DIOVAN) 80 MG tablet Take 1 tablet (80 mg total) by mouth daily. 04/09/23   Antonieta Iba, MD  Zinc 50 MG  TABS Take 50 mg by mouth as needed.    [provider]    Physical Exam    Vital Signs:  Felita I Ludwick does not have vital signs available for review today.  Given telephonic nature of communication, physical exam is limited. AAOx3. NAD. Normal affect.  Speech and respirations are unlabored.  Accessory Clinical Findings    None  Assessment & Plan    1.  Preoperative Cardiovascular Risk Assessment:Procedure:   Rt TKA CORI   Date of Surgery:  Clearance 09/06/23                                  Surgeon:  Dr. Cassell Smiles Surgeon's Group or Practice Name:  [facility] Phone number:  [phone] Fax number:  not indicated      Primary Cardiologist: Julien Nordmann, MD  Chart reviewed as part of pre-operative protocol coverage. Given past medical history and time since last visit, based on ACC/AHA guidelines, Sharmaine I Mcnelly would be at acceptable risk for the planned procedure without further cardiovascular testing.   Her RCRI is moderate risk, 6.6% risk of major cardiac event.  She is able to complete greater than 4 METS of physical activity.  Patient was advised that if she develops new  symptoms prior to surgery to contact our office to arrange a follow-up appointment.  He verbalized understanding.  Patient's aspirin should be continued throughout the perioperative period.  If surgeon feels bleeding risk is too high aspirin may be held for 5 to 7 days prior to her procedure.  I will route this recommendation to the requesting party via Epic fax function and remove from pre-op pool.       Time:   Today, I have spent 10 minutes with the patient with telehealth technology discussing medical history, symptoms, and management plan.     Ronney Asters, NP  08/30/2023, 7:48 AM

## 2023-08-30 NOTE — Telephone Encounter (Signed)
 Called surgeon's office for fax# . I was given fax# [phone]. I will fax clearance notes from Edd Fabian, FNP today.

## 2023-10-17 NOTE — Progress Notes (Signed)
 GYNECOLOGY PROGRESS NOTE  Subjective:  PCP: Pcp, No  Patient ID: Veronica Stanley, female    DOB: 1945-07-11, 78 y.o.   MRN: [id number]  HPI  Patient is a 78 y.o. G9P2002 female who presents for a referral from [facility] for incontinence. This has been chronic and happens when she drinks too much liquid and doesn't take enough bathroom breaks. Worse at night. Has improved when she limits her fluid intake before bed. Denies vaginal pressure or bulging. She recently had R knee surgery and doesn't think she can do a pelvic exam today.   GYN HX: LMP: post-menopausal without bleeding  Last pap: unsure, denies hx of abnormals STI Hx: denies  OB History  Gravida Para Term Preterm AB Living  2 2 2   2   SAB IAB Ectopic Multiple Live Births          # Outcome Date GA Lbr Len/2nd Weight Sex Type Anes PTL Lv  2 Term      Vag-Spont     1 Term      Vag-Spont      Past Medical History:  Diagnosis Date   Agatston coronary artery calcium  score between 100 and 199    a. 06/2020 Cardiac CT: Ca2+ = 157 (66th %i'le).   Basal cell carcinoma, arm 04/2012   on arm and face    Chronic heart failure with preserved ejection fraction (HFpEF) (HCC)    a. 05/2020 Echo: EF 55-60%, no rwma, GrII DD, nl RV fxn, mild MR, triv AI.   Diverticulitis    colon   GERD (gastroesophageal reflux disease)    Hypertension    patient denies not on meds    Menopausal symptoms    Osteoarthrosis involving, or with mention of more than one site, but not specified as generalized, multiple sites    Osteoporosis    PAF (paroxysmal atrial fibrillation) (HCC)    a. CHA2DS2VASc = 5-6 -->eliquis .   Peripheral vascular disease (HCC)    PSVT (paroxysmal supraventricular tachycardia) (HCC)    a. 02/2022 Zio: Predominantly sinus rhythm-60 bpm (41-176).  95 SVT runs (fastest 176x6 beats; longest 20.9 seconds at 122 bpm).  No triggered events.   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   Unilateral primary osteoarthritis, left hip 09/17/2017   Osteoarthritis of both hips 11/10/2015   Advance directive discussed with patient 11/03/2014   Routine general medical examination at a health care facility 03/03/2011   Osteoarthritis, multiple sites    GERD 10/11/2007   ACTINIC KERATOSIS 04/17/2007   Essential hypertension, benign 03/28/2007   Diverticulosis of colon 03/28/2007   Osteoporosis 03/28/2007   Past Surgical History:  Procedure Laterality Date   ABDOMINAL HYSTERECTOMY     BREAST BIOPSY Left 2003   Left breast calcifications benign   COLONOSCOPY WITH PROPOFOL  N/A 04/22/2018   Procedure: COLONOSCOPY WITH PROPOFOL ;  Surgeon: Cassie Click, MD;  Location: [facility] ENDOSCOPY;  Service: Endoscopy;  Laterality: N/A;   DILATION AND CURETTAGE OF UTERUS     rupture- peritonitis & hysterectomy 12/79  IR KYPHO LUMBAR INC FX REDUCE BONE BX UNI/BIL CANNULATION INC/IMAGING  01/03/2022   IR RADIOLOGIST EVAL & MGMT  01/26/2022   TOTAL HIP ARTHROPLASTY Left 10/19/2017   Procedure: LEFT TOTAL HIP ARTHROPLASTY ANTERIOR APPROACH;  Surgeon: Arnie Lao, MD;  Location: [facility] ORS;  Service: Orthopedics;  Laterality: Left;   VAGINAL DELIVERY     x2   Family History  Problem Relation Age of Onset   Osteoporosis Mother    Coronary artery disease Father    Breast cancer Sister 37       twice   Cancer Maternal Grandfather        colon cancer   Breast cancer Cousin    Diabetes Neg Hx    Hypertension Neg Hx    Social History   Socioeconomic History    Marital status: Divorced    Spouse name: Not on file   Number of children: 2   Years of education: Not on file   Highest education level: Not on file  Occupational History   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,    Comment: Retired completely now   Occupation:    Tobacco Use   Smoking status: Never   Smokeless tobacco: Never  Vaping Use   Vaping status: Never Used  Substance and Sexual Activity   Alcohol use: Yes    Alcohol/week: 3.0 standard drinks of alcohol    Types: 3 Glasses of wine per week   Drug use: Never   Sexual activity: Not Currently  Other Topics Concern   Not on file  Social History Narrative   No living will   Would want daughter, Christiane Cowing,  Then son Adra Hope to make health care decisions for her   Would accept resuscitation attempts   Not sure about tube feedings   Social Drivers of Health   Financial Resource Strain: Not on file  Food Insecurity: Not on file  Transportation Needs: Not on file  Physical Activity: Not on file  Stress: Not on file  Social Connections: Not on file  Intimate Partner Violence: Not on file   Current Outpatient Medications on File Prior to Visit  Medication Sig Dispense Refill   B Complex Vitamins (VITAMIN B-COMPLEX) TABS Take 1 tablet by mouth daily.     Cholecalciferol (VITAMIN D3) 1000 units CAPS Take 1,000 Units by mouth daily.     Coenzyme Q10 (COQ10) 100 MG CAPS Take 100 mg by mouth daily.     COLLAGEN PO Take 1 tablet by mouth daily.     Cyanocobalamin  5000 MCG CAPS Place 1 capsule under the tongue daily.     doxycycline  (VIBRAMYCIN ) 100 MG capsule Take 1 capsule (100 mg total) by mouth 2 (two) times daily. 14 capsule 0   Glucosamine HCl (GLUCOSAMINE PO) Take 2 capsules by mouth in the morning.     MAGNESIUM GLYCINATE PO Take 400 Units by mouth at bedtime.     methocarbamol  (ROBAXIN ) 500 MG tablet Take 500 mg by mouth 2 (two) times daily as needed for muscle spasms.     metoprolol   tartrate (LOPRESSOR ) 25 MG tablet Take 1 tablet (25 mg total) by mouth 2 (two) times daily. 180 tablet 3   Multiple Vitamin (MULTIVITAMIN) tablet Take 1 tablet by mouth daily.     Multiple Vitamins-Minerals (PRESERVISION AREDS PO) Take by mouth daily.     pantoprazole  (PROTONIX ) 40 MG tablet Take 40 mg by mouth daily.     potassium chloride  (KLOR-CON  M) 10 MEQ  tablet Take 1 tablet (10 meq) twice a day on Mondays/ Wednesday/ Fridays/ Saturdays & take 1 tablet (10 meq) once daily on Tuesdays/ Thursdays/ Sundays 180 tablet 1   triamcinolone  cream (KENALOG ) 0.1 % Apply to affected skin once every 2 days. 454 g 0   valsartan  (DIOVAN ) 80 MG tablet Take 1 tablet (80 mg total) by mouth daily. 90 tablet 3   Zinc 50 MG TABS Take 50 mg by mouth as needed.     diltiazem  (CARDIZEM ) 60 MG tablet Take 1 tablet (60 mg total) by mouth daily as needed. Take for elevated heart rate greater than 100 (Patient not taking: Reported on 10/18/2023) 30 tablet 1   furosemide  (LASIX ) 20 MG tablet Take 20 mg by mouth daily as needed. (Patient not taking: Reported on 10/18/2023)     ibuprofen (ADVIL,MOTRIN) 200 MG tablet Take 400 mg by mouth daily as needed for headache or mild pain. (Patient not taking: Reported on 10/18/2023)     traMADol  (ULTRAM ) 50 MG tablet Take 1 tablet by mouth 3 (three) times daily. (Patient not taking: Reported on 10/18/2023)     No current facility-administered medications on file prior to visit.   Allergies  Allergen Reactions   Penicillins Anaphylaxis, Shortness Of Breath, Swelling and Rash    Has patient had a PCN reaction causing immediate rash, facial/tongue/throat swelling, SOB or lightheadedness with hypotension: Yes Has patient had a PCN reaction causing severe rash involving mucus membranes or skin necrosis: Yes Has patient had a PCN reaction that required hospitalization: No Has patient had a PCN reaction occurring within the last 10 years: No If all of the above answers are "NO", then may  proceed with Cephalosporin use.    Calcium  Channel Blockers     Leg swelling   Ibandronate Sodium Other (See Comments)    aches   Eliquis  [Apixaban ] Rash    Hair loss  Blisters on face    Review of Systems Pertinent items are noted in HPI.   Objective:   Height 5' 6.5" (1.689 m), weight 230 lb (104.3 kg). Body mass index is 36.57 kg/m.  General appearance: alert, cooperative, and no distress Pelvic: deferred Extremities:  R knee swollen with scar Neurologic: Grossly normal   Assessment/Plan:   1. Urinary incontinence, unspecified type    78 y.o. [id number] with incontinence and s/p R knee surgery, unable to assess for POP or VVA today. Because her incontinence is improving with behavioral modifications, feel she can continue at home, start pelvic floor exercises, and return after her physical therapy for her knee is complete and we will assess for any prolapse or atrphy at that time.  -Pelvic floor exercises handout provided today, do daily -RTC in 1 month to assess for POP or VVA   Sofia Dunn, DO [facility]

## 2023-10-18 ENCOUNTER — Ambulatory Visit: Admitting: Obstetrics

## 2023-10-18 ENCOUNTER — Encounter: Payer: Self-pay | Admitting: Obstetrics

## 2023-10-18 VITALS — Ht 66.5 in | Wt 230.0 lb

## 2023-10-18 DIAGNOSIS — R32 Unspecified urinary incontinence: Secondary | ICD-10-CM | POA: Diagnosis not present

## 2023-11-06 ENCOUNTER — Encounter (INDEPENDENT_AMBULATORY_CARE_PROVIDER_SITE_OTHER): Payer: Self-pay

## 2023-11-26 NOTE — Progress Notes (Unsigned)
    GYNECOLOGY PROGRESS NOTE  Subjective:  PCP: Pcp, No  Patient ID: Veronica Stanley, female    DOB: [birth year], 78 y.o.   MRN: [id number]  HPI  Patient is a 78 y.o. G41P2002 female who presents for incontinence.   {Common ambulatory SmartLinks:19316}  Review of Systems {ros; complete:30496}   Objective:   There were no vitals taken for this visit. There is no height or weight on file to calculate BMI.  General appearance: {general exam:16600} Abdomen: {abdominal exam:16834} Pelvic: {pelvic exam:16852::"cervix normal in appearance","external genitalia normal","no adnexal masses or tenderness","no cervical motion tenderness","rectovaginal septum normal","uterus normal size, shape, and consistency","vagina normal without discharge"} Extremities: {extremity exam:5109} Neurologic: {neuro exam:17854}   Assessment/Plan:   No diagnosis found.   There are no diagnoses linked to this encounter.     Sofia Dunn, DO [facility]

## 2023-11-27 ENCOUNTER — Encounter: Payer: Self-pay | Admitting: Obstetrics

## 2023-11-27 ENCOUNTER — Ambulatory Visit: Admitting: Obstetrics

## 2023-11-27 VITALS — BP 165/68 | HR 56 | Ht 66.0 in | Wt 233.0 lb

## 2023-11-27 DIAGNOSIS — R32 Unspecified urinary incontinence: Secondary | ICD-10-CM | POA: Insufficient documentation

## 2023-11-27 DIAGNOSIS — I10 Essential (primary) hypertension: Secondary | ICD-10-CM

## 2023-11-27 DIAGNOSIS — Z09 Encounter for follow-up examination after completed treatment for conditions other than malignant neoplasm: Secondary | ICD-10-CM

## 2023-11-27 DIAGNOSIS — N3941 Urge incontinence: Secondary | ICD-10-CM

## 2023-12-24 ENCOUNTER — Other Ambulatory Visit: Payer: Self-pay | Admitting: Emergency Medicine

## 2023-12-24 DIAGNOSIS — Z1231 Encounter for screening mammogram for malignant neoplasm of breast: Secondary | ICD-10-CM

## 2024-01-08 ENCOUNTER — Encounter

## 2024-01-21 ENCOUNTER — Ambulatory Visit
Admission: RE | Admit: 2024-01-21 | Discharge: 2024-01-21 | Disposition: A | Discharge status: Home / Self Care | Source: Ambulatory Visit | Attending: Emergency Medicine | Admitting: Emergency Medicine

## 2024-01-21 DIAGNOSIS — Z1231 Encounter for screening mammogram for malignant neoplasm of breast: Secondary | ICD-10-CM | POA: Insufficient documentation

## 2024-04-21 ENCOUNTER — Encounter: Payer: Self-pay | Admitting: Radiology
